# Patient Record
Sex: Female | Born: 1965 | Hispanic: No | Marital: Married | Smoking: Never smoker
Health system: Southern US, Community
[De-identification: ages and names within clinical notes are randomized; demographics above are authoritative.]

## PROBLEM LIST (undated history)

## (undated) DIAGNOSIS — R7401 Elevation of levels of liver transaminase levels: Secondary | ICD-10-CM

## (undated) DIAGNOSIS — B009 Herpesviral infection, unspecified: Secondary | ICD-10-CM

## (undated) DIAGNOSIS — G43909 Migraine, unspecified, not intractable, without status migrainosus: Secondary | ICD-10-CM

## (undated) DIAGNOSIS — R079 Chest pain, unspecified: Secondary | ICD-10-CM

## (undated) DIAGNOSIS — Z9889 Other specified postprocedural states: Secondary | ICD-10-CM

## (undated) DIAGNOSIS — Z90710 Acquired absence of both cervix and uterus: Secondary | ICD-10-CM

## (undated) DIAGNOSIS — D509 Iron deficiency anemia, unspecified: Secondary | ICD-10-CM

## (undated) DIAGNOSIS — D649 Anemia, unspecified: Secondary | ICD-10-CM

## (undated) DIAGNOSIS — K8020 Calculus of gallbladder without cholecystitis without obstruction: Secondary | ICD-10-CM

## (undated) DIAGNOSIS — E559 Vitamin D deficiency, unspecified: Secondary | ICD-10-CM

## (undated) DIAGNOSIS — D259 Leiomyoma of uterus, unspecified: Secondary | ICD-10-CM

## (undated) DIAGNOSIS — J45909 Unspecified asthma, uncomplicated: Secondary | ICD-10-CM

## (undated) DIAGNOSIS — E7800 Pure hypercholesterolemia, unspecified: Secondary | ICD-10-CM

## (undated) DIAGNOSIS — F32A Depression, unspecified: Secondary | ICD-10-CM

## (undated) DIAGNOSIS — J309 Allergic rhinitis, unspecified: Secondary | ICD-10-CM

## (undated) DIAGNOSIS — F330 Major depressive disorder, recurrent, mild: Secondary | ICD-10-CM

## (undated) DIAGNOSIS — F329 Major depressive disorder, single episode, unspecified: Secondary | ICD-10-CM

## (undated) DIAGNOSIS — Z789 Other specified health status: Secondary | ICD-10-CM

## (undated) DIAGNOSIS — A048 Other specified bacterial intestinal infections: Secondary | ICD-10-CM

## (undated) DIAGNOSIS — D219 Benign neoplasm of connective and other soft tissue, unspecified: Secondary | ICD-10-CM

## (undated) DIAGNOSIS — R002 Palpitations: Secondary | ICD-10-CM

## (undated) DIAGNOSIS — N939 Abnormal uterine and vaginal bleeding, unspecified: Secondary | ICD-10-CM

## (undated) DIAGNOSIS — IMO0001 Reserved for inherently not codable concepts without codable children: Secondary | ICD-10-CM

## (undated) HISTORY — PX: TUBAL LIGATION: SHX77

## (undated) HISTORY — DX: Pure hypercholesterolemia, unspecified: E78.00

## (undated) HISTORY — PX: UTERINE FIBROID SURGERY: SHX826

## (undated) HISTORY — DX: Abnormal uterine and vaginal bleeding, unspecified: N93.9

## (undated) HISTORY — DX: Chest pain, unspecified: R07.9

## (undated) HISTORY — DX: Reserved for inherently not codable concepts without codable children: IMO0001

## (undated) HISTORY — DX: Other specified postprocedural states: Z98.890

## (undated) HISTORY — DX: Depression, unspecified: F32.A

## (undated) HISTORY — DX: Acquired absence of both cervix and uterus: Z90.710

## (undated) HISTORY — DX: Major depressive disorder, recurrent, mild: F33.0

## (undated) HISTORY — DX: Allergic rhinitis, unspecified: J30.9

## (undated) HISTORY — DX: Leiomyoma of uterus, unspecified: D25.9

## (undated) HISTORY — DX: Palpitations: R00.2

## (undated) HISTORY — DX: Unspecified asthma, uncomplicated: J45.909

## (undated) HISTORY — DX: Major depressive disorder, single episode, unspecified: F32.9

## (undated) HISTORY — DX: Elevation of levels of liver transaminase levels: R74.01

## (undated) HISTORY — DX: Vitamin D deficiency, unspecified: E55.9

## (undated) HISTORY — DX: Other specified bacterial intestinal infections: A04.8

## (undated) HISTORY — DX: Herpesviral infection, unspecified: B00.9

## (undated) HISTORY — DX: Iron deficiency anemia, unspecified: D50.9

## (undated) HISTORY — DX: Calculus of gallbladder without cholecystitis without obstruction: K80.20

## (undated) HISTORY — DX: Benign neoplasm of connective and other soft tissue, unspecified: D21.9

## (undated) HISTORY — DX: Other specified health status: Z78.9

---

## 2010-05-07 ENCOUNTER — Emergency Department (HOSPITAL_COMMUNITY): Admission: EM | Admit: 2010-05-07 | Discharge: 2010-05-07 | Payer: Self-pay | Admitting: Emergency Medicine

## 2013-12-10 ENCOUNTER — Ambulatory Visit
Admission: RE | Admit: 2013-12-10 | Discharge: 2013-12-10 | Disposition: A | Discharge status: Home / Self Care | Payer: BC Managed Care – PPO | Source: Ambulatory Visit | Attending: Family Medicine | Admitting: Family Medicine

## 2013-12-10 ENCOUNTER — Other Ambulatory Visit: Payer: Self-pay | Admitting: Family Medicine

## 2013-12-10 DIAGNOSIS — T1490XA Injury, unspecified, initial encounter: Secondary | ICD-10-CM

## 2013-12-10 DIAGNOSIS — R52 Pain, unspecified: Secondary | ICD-10-CM

## 2014-10-08 DIAGNOSIS — Z9889 Other specified postprocedural states: Secondary | ICD-10-CM

## 2014-10-08 HISTORY — DX: Other specified postprocedural states: Z98.890

## 2015-09-25 ENCOUNTER — Encounter (HOSPITAL_COMMUNITY): Payer: Self-pay | Admitting: Emergency Medicine

## 2015-09-25 ENCOUNTER — Emergency Department (HOSPITAL_COMMUNITY)
Admission: EM | Admit: 2015-09-25 | Discharge: 2015-09-25 | Disposition: A | Discharge status: Home / Self Care | Payer: BLUE CROSS/BLUE SHIELD | Attending: Emergency Medicine | Admitting: Emergency Medicine

## 2015-09-25 DIAGNOSIS — Z7951 Long term (current) use of inhaled steroids: Secondary | ICD-10-CM | POA: Insufficient documentation

## 2015-09-25 DIAGNOSIS — Z79899 Other long term (current) drug therapy: Secondary | ICD-10-CM | POA: Diagnosis not present

## 2015-09-25 DIAGNOSIS — G43909 Migraine, unspecified, not intractable, without status migrainosus: Secondary | ICD-10-CM | POA: Diagnosis not present

## 2015-09-25 DIAGNOSIS — R51 Headache: Secondary | ICD-10-CM

## 2015-09-25 DIAGNOSIS — Z3202 Encounter for pregnancy test, result negative: Secondary | ICD-10-CM | POA: Insufficient documentation

## 2015-09-25 DIAGNOSIS — R519 Headache, unspecified: Secondary | ICD-10-CM

## 2015-09-25 HISTORY — DX: Migraine, unspecified, not intractable, without status migrainosus: G43.909

## 2015-09-25 LAB — CBC WITH DIFFERENTIAL/PLATELET
BASOS ABS: 0 10*3/uL (ref 0.0–0.1)
BASOS PCT: 0 %
EOS PCT: 2 %
Eosinophils Absolute: 0.1 10*3/uL (ref 0.0–0.7)
HEMATOCRIT: 33.7 % — AB (ref 36.0–46.0)
Hemoglobin: 10.6 g/dL — ABNORMAL LOW (ref 12.0–15.0)
LYMPHS PCT: 29 %
Lymphs Abs: 1.3 10*3/uL (ref 0.7–4.0)
MCH: 25.7 pg — ABNORMAL LOW (ref 26.0–34.0)
MCHC: 31.5 g/dL (ref 30.0–36.0)
MCV: 81.6 fL (ref 78.0–100.0)
MONO ABS: 0.3 10*3/uL (ref 0.1–1.0)
MONOS PCT: 6 %
NEUTROS ABS: 2.9 10*3/uL (ref 1.7–7.7)
Neutrophils Relative %: 63 %
PLATELETS: 326 10*3/uL (ref 150–400)
RBC: 4.13 MIL/uL (ref 3.87–5.11)
RDW: 15 % (ref 11.5–15.5)
WBC: 4.6 10*3/uL (ref 4.0–10.5)

## 2015-09-25 LAB — POC URINE PREG, ED: Preg Test, Ur: NEGATIVE

## 2015-09-25 LAB — BASIC METABOLIC PANEL
Anion gap: 9 (ref 5–15)
BUN: 19 mg/dL (ref 6–20)
CALCIUM: 9 mg/dL (ref 8.9–10.3)
CO2: 19 mmol/L — AB (ref 22–32)
CREATININE: 1.03 mg/dL — AB (ref 0.44–1.00)
Chloride: 112 mmol/L — ABNORMAL HIGH (ref 101–111)
GFR calc Af Amer: >60 mL/min (ref >60)
GLUCOSE: 99 mg/dL (ref 65–99)
Potassium: 3.9 mmol/L (ref 3.5–5.1)
Sodium: 140 mmol/L (ref 135–145)

## 2015-09-25 MED ORDER — SODIUM CHLORIDE 0.9 % IV BOLUS (SEPSIS)
1000.0000 mL | Freq: Once | INTRAVENOUS | Status: AC
  Administered 2015-09-25: 1000 mL via INTRAVENOUS

## 2015-09-25 MED ORDER — DIPHENHYDRAMINE HCL 50 MG/ML IJ SOLN
12.5000 mg | Freq: Once | INTRAMUSCULAR | Status: AC
  Administered 2015-09-25: 12.5 mg via INTRAVENOUS
  Filled 2015-09-25: qty 1

## 2015-09-25 MED ORDER — METOCLOPRAMIDE HCL 5 MG/ML IJ SOLN
10.0000 mg | Freq: Once | INTRAMUSCULAR | Status: AC
  Administered 2015-09-25: 10 mg via INTRAVENOUS
  Filled 2015-09-25: qty 2

## 2015-09-25 MED ORDER — KETOROLAC TROMETHAMINE 30 MG/ML IJ SOLN: 30.0000 mg | Freq: Once | INTRAMUSCULAR | Status: DC

## 2015-09-25 MED ORDER — SODIUM CHLORIDE 0.9 % IV BOLUS (SEPSIS): 1000.0000 mL | Freq: Once | INTRAVENOUS | Status: DC

## 2015-09-25 NOTE — ED Notes (Signed)
Pt from home c/o migraine x 2 days. She reports headache to the left side. She has been treating self with her usual migraine meds without relief. She reports nausea but no vomiting.

## 2015-09-25 NOTE — Discharge Instructions (Signed)
1. Medications: usual home medications 2. Treatment: rest, drink plenty of fluids 3. Follow Up: please followup with your primary doctor this week for discussion of your diagnoses and further evaluation after today's visit; if you do not have a primary care doctor use the resource guide provided to find one; please return to the ER for severe headache, numbness, weakness, new or worsening symptoms   Migraine Headache A migraine headache is an intense, throbbing pain on one or both sides of your head. A migraine can last for 30 minutes to several hours. CAUSES  The exact cause of a migraine headache is not always known. However, a migraine may be caused when nerves in the brain become irritated and release chemicals that cause inflammation. This causes pain. Certain things may also trigger migraines, such as:  Alcohol.  Smoking.  Stress.  Menstruation.  Aged cheeses.  Foods or drinks that contain nitrates, glutamate, aspartame, or tyramine.  Lack of sleep.  Chocolate.  Caffeine.  Hunger.  Physical exertion.  Fatigue.  Medicines used to treat chest pain (nitroglycerine), birth control pills, estrogen, and some blood pressure medicines. SIGNS AND SYMPTOMS  Pain on one or both sides of your head.  Pulsating or throbbing pain.  Severe pain that prevents daily activities.  Pain that is aggravated by any physical activity.  Nausea, vomiting, or both.  Dizziness.  Pain with exposure to bright lights, loud noises, or activity.  General sensitivity to bright lights, loud noises, or smells. Before you get a migraine, you may get warning signs that a migraine is coming (aura). An aura may include:  Seeing flashing lights.  Seeing bright spots, halos, or zigzag lines.  Having tunnel vision or blurred vision.  Having feelings of numbness or tingling.  Having trouble talking.  Having muscle weakness. DIAGNOSIS  A migraine headache is often diagnosed based  on:  Symptoms.  Physical exam.  A CT scan or MRI of your head. These imaging tests cannot diagnose migraines, but they can help rule out other causes of headaches. TREATMENT Medicines may be given for pain and nausea. Medicines can also be given to help prevent recurrent migraines.  HOME CARE INSTRUCTIONS  Only take over-the-counter or prescription medicines for pain or discomfort as directed by your health care provider. The use of long-term narcotics is not recommended.  Lie down in a dark, quiet room when you have a migraine.  Keep a journal to find out what may trigger your migraine headaches. For example, write down:  What you eat and drink.  How much sleep you get.  Any change to your diet or medicines.  Limit alcohol consumption.  Quit smoking if you smoke.  Get 7-9 hours of sleep, or as recommended by your health care provider.  Limit stress.  Keep lights dim if bright lights bother you and make your migraines worse. SEEK IMMEDIATE MEDICAL CARE IF:   Your migraine becomes severe.  You have a fever.  You have a stiff neck.  You have vision loss.  You have muscular weakness or loss of muscle control.  You start losing your balance or have trouble walking.  You feel faint or pass out.  You have severe symptoms that are different from your first symptoms. MAKE SURE YOU:   Understand these instructions.  Will watch your condition.  Will get help right away if you are not doing well or get worse.   This information is not intended to replace advice given to you by your health care  provider. Make sure you discuss any questions you have with your health care provider.   Document Released: 09/24/2005 Document Revised: 10/15/2014 Document Reviewed: 06/01/2013 Elsevier Interactive Patient Education [employer].

## 2015-09-25 NOTE — ED Provider Notes (Signed)
CSN: [id number]     Arrival date & time 09/25/15  0940 History   First MD Initiated Contact with Patient 09/25/15 [phone]     Chief Complaint  Patient presents with  . Migraine    HPI   Stephanie Hancock is a 49 y.o. female with a PMH of migraines who presents to the ED with migraine x 2 days. She states her symptoms are constant. She reports light exacerbates her pain. She states she has tried excedrin at home with no significant symptom relief. She reports associated nausea and lightheadedness. She denies fever, chills, dizziness, vision changes, neck pain, vomiting. She states her symptoms are characteristic of her typical migraine headache.   Past Medical History  Diagnosis Date  . Migraine    History reviewed. No pertinent past surgical history. No family history on file. Social History  Substance Use Topics  . Smoking status: Never Smoker   . Smokeless tobacco: None  . Alcohol Use: No   OB History    No data available      Review of Systems  Constitutional: Negative for fever and chills.  Eyes: Positive for photophobia. Negative for visual disturbance.  Gastrointestinal: Positive for nausea. Negative for vomiting.  Musculoskeletal: Negative for neck pain.  Neurological: Positive for light-headedness and headaches. Negative for dizziness, syncope, weakness and numbness.  All other systems reviewed and are negative.     Allergies  Codeine  Home Medications   Prior to Admission medications   Medication Sig Start Date End Date Taking? Authorizing Provider  albuterol (PROVENTIL HFA;VENTOLIN HFA) 108 (90 BASE) MCG/ACT inhaler Inhale 1-2 puffs into the lungs every 6 (six) hours as needed for wheezing or shortness of breath.   Yes Historical Provider, MD  aspirin-acetaminophen-caffeine (EXCEDRIN MIGRAINE) [phone] MG tablet Take 2 tablets by mouth every 6 (six) hours as needed for headache.   Yes Historical Provider, MD  fluticasone-salmeterol (ADVAIR HFA) 45-21 MCG/ACT  inhaler Inhale 2 puffs into the lungs 2 (two) times daily.   Yes Historical Provider, MD  topiramate (TOPAMAX) 50 MG tablet Take 50 mg by mouth at bedtime. 08/07/15  Yes Historical Provider, MD    BP 108/69 mmHg  Pulse 61  Temp(Src) 98.1 F (36.7 C) (Oral)  Resp 16  Wt 57.153 kg  SpO2 98%  LMP 09/25/2015 Physical Exam  Constitutional: She is oriented to person, place, and time. She appears well-developed and well-nourished. No distress.  HENT:  Head: Normocephalic and atraumatic.  Right Ear: External ear normal.  Left Ear: External ear normal.  Nose: Nose normal.  Mouth/Throat: Uvula is midline, oropharynx is clear and moist and mucous membranes are normal.  Eyes: Conjunctivae, EOM and lids are normal. Pupils are equal, round, and reactive to light. Right eye exhibits no discharge. Left eye exhibits no discharge. No scleral icterus.  Neck: Normal range of motion. Neck supple.  Cardiovascular: Normal rate, regular rhythm, normal heart sounds, intact distal pulses and normal pulses.   Pulmonary/Chest: Effort normal and breath sounds normal. No respiratory distress. She has no wheezes. She has no rales.  Abdominal: Soft. Normal appearance and bowel sounds are normal. She exhibits no distension and no mass. There is no tenderness. There is no rigidity, no rebound and no guarding.  Musculoskeletal: Normal range of motion. She exhibits no edema or tenderness.  Neurological: She is alert and oriented to person, place, and time. She has normal strength. No cranial nerve deficit or sensory deficit.  Skin: Skin is warm, dry and intact. No rash noted.  She is not diaphoretic. No erythema. No pallor.  Psychiatric: She has a normal mood and affect. Her speech is normal and behavior is normal.  Nursing note and vitals reviewed.   ED Course  Procedures (including critical care time)  Labs Review Labs Reviewed  CBC WITH DIFFERENTIAL/PLATELET - Abnormal; Notable for the following:    Hemoglobin  10.6 (*)    HCT 33.7 (*)    MCH 25.7 (*)    All other components within normal limits  BASIC METABOLIC PANEL - Abnormal; Notable for the following:    Chloride 112 (*)    CO2 19 (*)    Creatinine, Ser 1.03 (*)    All other components within normal limits  POC URINE PREG, ED    Imaging Review No results found.   I have personally reviewed and evaluated these lab results as part of my medical decision-making.   EKG Interpretation None      MDM   Final diagnoses:  Headache, unspecified headache type    49 year old female presents with migraine for the past 2 days. Reports associated photophobia and nausea. Denies fever, chills, dizziness, vision changes, neck pain, vomiting. States she has a history of migraines, and that her symptoms feel characteristic of her typical headache. Patient is afebrile. Vital signs stable. Normal neuro exam with no focal deficit. No nuchal rigidity.   Will give reglan and benadryl for headache. CBC negative for leukocytosis, hemoglobin 10.6. BMP unremarkable.  On reassessment of patient, she reports resolution of her headache.  Patient is nontoxic and well-appearing, feel she is stable for discharge at this time. Patient's presentation is consistent with her history of migraine; doubt ICH, SAH, meningitis. Patient to follow-up with PCP. Return precautions discussed. Patient verbalizes her understanding and is in agreement with plan.  BP 108/69 mmHg  Pulse 61  Temp(Src) 98.1 F (36.7 C) (Oral)  Resp 16  Wt 57.153 kg  SpO2 98%  LMP 09/25/2015     Marella Chimes, PA-C 09/25/15 [place], MD 09/26/15 [phone]

## 2016-09-05 DIAGNOSIS — N6332 Unspecified lump in axillary tail of the left breast: Secondary | ICD-10-CM | POA: Diagnosis not present

## 2016-09-05 DIAGNOSIS — D259 Leiomyoma of uterus, unspecified: Secondary | ICD-10-CM | POA: Diagnosis not present

## 2016-09-06 DIAGNOSIS — G43019 Migraine without aura, intractable, without status migrainosus: Secondary | ICD-10-CM | POA: Diagnosis not present

## 2016-09-06 DIAGNOSIS — H538 Other visual disturbances: Secondary | ICD-10-CM | POA: Diagnosis not present

## 2016-09-25 DIAGNOSIS — R922 Inconclusive mammogram: Secondary | ICD-10-CM | POA: Diagnosis not present

## 2016-09-25 DIAGNOSIS — N6332 Unspecified lump in axillary tail of the left breast: Secondary | ICD-10-CM | POA: Diagnosis not present

## 2016-11-08 DIAGNOSIS — D250 Submucous leiomyoma of uterus: Secondary | ICD-10-CM | POA: Diagnosis not present

## 2016-11-08 DIAGNOSIS — N920 Excessive and frequent menstruation with regular cycle: Secondary | ICD-10-CM | POA: Diagnosis not present

## 2016-11-08 DIAGNOSIS — E559 Vitamin D deficiency, unspecified: Secondary | ICD-10-CM | POA: Diagnosis not present

## 2016-11-08 DIAGNOSIS — D649 Anemia, unspecified: Secondary | ICD-10-CM | POA: Diagnosis not present

## 2016-11-08 DIAGNOSIS — Z3202 Encounter for pregnancy test, result negative: Secondary | ICD-10-CM | POA: Diagnosis not present

## 2016-11-12 DIAGNOSIS — N809 Endometriosis, unspecified: Secondary | ICD-10-CM | POA: Diagnosis not present

## 2016-12-14 DIAGNOSIS — R51 Headache: Secondary | ICD-10-CM | POA: Diagnosis not present

## 2016-12-14 DIAGNOSIS — G43019 Migraine without aura, intractable, without status migrainosus: Secondary | ICD-10-CM | POA: Diagnosis not present

## 2017-01-28 DIAGNOSIS — N809 Endometriosis, unspecified: Secondary | ICD-10-CM | POA: Diagnosis not present

## 2017-03-01 DIAGNOSIS — Z01419 Encounter for gynecological examination (general) (routine) without abnormal findings: Secondary | ICD-10-CM | POA: Diagnosis not present

## 2017-03-01 DIAGNOSIS — R339 Retention of urine, unspecified: Secondary | ICD-10-CM | POA: Diagnosis not present

## 2017-03-01 DIAGNOSIS — Z6827 Body mass index (BMI) 27.0-27.9, adult: Secondary | ICD-10-CM | POA: Diagnosis not present

## 2017-03-01 DIAGNOSIS — D259 Leiomyoma of uterus, unspecified: Secondary | ICD-10-CM | POA: Diagnosis not present

## 2017-03-01 DIAGNOSIS — R3911 Hesitancy of micturition: Secondary | ICD-10-CM | POA: Diagnosis not present

## 2017-03-14 DIAGNOSIS — D259 Leiomyoma of uterus, unspecified: Secondary | ICD-10-CM | POA: Diagnosis not present

## 2017-03-14 DIAGNOSIS — N946 Dysmenorrhea, unspecified: Secondary | ICD-10-CM | POA: Diagnosis not present

## 2017-03-27 ENCOUNTER — Telehealth: Payer: Self-pay | Admitting: *Deleted

## 2017-03-27 NOTE — Telephone Encounter (Signed)
Contacted the patient and gave her the appt for July 2nd at 11:30; arrive at 11:15am.

## 2017-03-29 ENCOUNTER — Encounter: Payer: Self-pay | Admitting: Obstetrics and Gynecology

## 2017-03-29 ENCOUNTER — Ambulatory Visit (INDEPENDENT_AMBULATORY_CARE_PROVIDER_SITE_OTHER): Payer: BLUE CROSS/BLUE SHIELD | Admitting: Obstetrics and Gynecology

## 2017-03-29 VITALS — BP 112/70 | HR 88 | Resp 16 | Ht 61.0 in | Wt 144.0 lb

## 2017-03-29 DIAGNOSIS — N939 Abnormal uterine and vaginal bleeding, unspecified: Secondary | ICD-10-CM | POA: Diagnosis not present

## 2017-03-29 DIAGNOSIS — D259 Leiomyoma of uterus, unspecified: Secondary | ICD-10-CM

## 2017-03-29 NOTE — Progress Notes (Signed)
GYNECOLOGY  VISIT   HPI: 51 y.o.   Married  [place] American  female   [phone] with Patient's last menstrual period was 03/29/2017.   here for fibroids; irregular cycles; was on Depo-Lupron last February 2017 - August 2017, is currently on Depo-Provera since February 2018.   Here with her husband for the discussion portion of the visit.   Interested in hysterectomy and wants to avoid an abdominal hysterectomy if possible. Declines future childbearing.   Here for a second opinion.  Has multiple fibroids.  States cervical fibroid.  Having pain.  Lots of pressure on her bladder. Some difficulty to void.  Urinary stress incontinence since having fibroids.   Feels bloated.   Bleeding off and on since February.  Light menses in March.  No cycle in April.  Had 2 cycles in May.   No prior EMB.  Hx anemia.  Last blood work was Dec or Feb. 2018, and this was normal.  Has nausea and vomiting with food intake.   Had laparoscopic myomectomy in [place] about 12 years ago.   FH of fibroids with mother and sister.   Is Jehovah's Witness.   Sees neurology for headaches.   Referred by friend, Aniceto Boss.  PCP - Dr. Jonathon Jordan.  GYNECOLOGIC HISTORY: Patient's last menstrual period was 03/29/2017. Contraception:  none Menopausal hormone therapy:  n/a Last mammogram:  About 09/2016 done at [facility] Last pap smear:   02/2016 at [facility] -- normal per patient        OB History    Gravida Para Term Preterm AB Living   3 2 0 0 1 2   SAB TAB Ectopic Multiple Live Births   1 0 0 0 0         Patient Active Problem List   Diagnosis Date Noted  . Fibroids 03/31/2017    Past Medical History:  Diagnosis Date  . Abnormal uterine bleeding   . Asthma   . Depression   . Fibroid   . Migraine    with aura  . Patient is Jehovah's Witness     Past Surgical History:  Procedure Laterality Date  . TUBAL LIGATION     then reversal  . UTERINE FIBROID SURGERY      Current  Outpatient Prescriptions  Medication Sig Dispense Refill  . albuterol (PROVENTIL HFA;VENTOLIN HFA) 108 (90 BASE) MCG/ACT inhaler Inhale 1-2 puffs into the lungs every 6 (six) hours as needed for wheezing or shortness of breath.    Marland Kitchen aspirin-acetaminophen-caffeine (EXCEDRIN MIGRAINE) 250-250-65 MG tablet Take 2 tablets by mouth every 6 (six) hours as needed for headache.    . flurbiprofen (ANSAID) 100 MG tablet Take 100 mg by mouth as needed.    . fluticasone-salmeterol (ADVAIR HFA) 45-21 MCG/ACT inhaler Inhale 2 puffs into the lungs 2 (two) times daily.    Marland Kitchen levETIRAcetam (KEPPRA XR) 500 MG 24 hr tablet   1  . medroxyPROGESTERone (DEPO-PROVERA) 150 MG/ML injection ADM 1 ML IM Q 11 TO 12 WKS  4  . zolmitriptan (ZOMIG-ZMT) 5 MG disintegrating tablet TK 1 T PO PRF MIGRAINE. MAY REPEAT IN 2 H PRN. NO MORE THAN 2 IN 24 H FOR 2 TO 3 DAYS  1   No current facility-administered medications for this visit.      ALLERGIES: Codeine and Kiwi extract  Family History  Problem Relation Age of Onset  . Hypertension Mother   . Diabetes Mother   . Osteoporosis Mother   . Hypertension Father   .  Prostate cancer Father   . Heart attack Father   . Kidney cancer Sister     Social History   Social History  . Marital status: Married    Spouse name: N/A  . Number of children: N/A  . Years of education: N/A   Occupational History  . Not on file.   Social History Main Topics  . Smoking status: Never Smoker  . Smokeless tobacco: Never Used  . Alcohol use 1.2 - 2.4 oz/week    2 - 4 Cans of beer per week  . Drug use: No  . Sexual activity: Yes    Birth control/ protection: None   Other Topics Concern  . Not on file   Social History Narrative  . No narrative on file    ROS:  Pertinent items are noted in HPI.  PHYSICAL EXAMINATION:    BP 112/70 (BP Location: Right Arm, Patient Position: Sitting, Cuff Size: Normal)   Pulse 88   Resp 16   Ht 5\' 1"  (1.549 m)   Wt 144 lb (65.3 kg)   LMP  03/29/2017   BMI 27.21 kg/m     General appearance: alert, cooperative and appears stated age Head: Normocephalic, without obvious abnormality, atraumatic Neck: no adenopathy, supple, symmetrical, trachea midline and thyroid normal to inspection and palpation Lungs: clear to auscultation bilaterally Heart: regular rate and rhythm Abdomen: Umbilical incision and lower abdominal small vertical incision, 18 week size mass, mobile, soft, non-tender. Extremities: extremities normal, atraumatic, no cyanosis or edema Skin: Skin color, texture, turgor normal. No rashes or lesions Lymph nodes: Cervical, supraclavicular, and axillary nodes normal. No abnormal inguinal nodes palpated Neurologic: Grossly normal  Pelvic: External genitalia:  no lesions              Urethra:  normal appearing urethra with no masses, tenderness or lesions              Bartholins and Skenes: normal                 Vagina: normal appearing vagina with normal color and discharge, no lesions.  Bleeding today.               Cervix: no lesions                Bimanual Exam:  Uterus:  18 week size uterus, more room on left pelvis than right, uterus does have mobility.  Has LUS suberosal versus pedunculated 2 cm right LUS fibroid.  Tender to palpation.                 Adnexa: no mass, fullness, tenderness              Rectal exam: Yes.  .  Confirms.              Anus:  normal sphincter tone, no lesions  Chaperone was present for exam.  ASSESSMENT  Fibroids.  Status post laparoscopic myomectomy with minilap for fibroid extraction.  Migraine with aura.  Asthma. Jehovah's witness.   PLAN  We discussed procedural treatment options for fibroids: Laparoscopic hysterectomy, abdominal hysterectomy, uterine artery embolization.  I think that laparoscopic hysterectomy is a possibility for her.  We reviewed rare risk of sarcoma.  We discussed morcellation in the [place] bag.  An additional 3 months of Depo Lupron would help  to improve this possibility and normalize any potential anemia.  Return for pelvic ultrasound and EMB. TSH, CBC.  Depo Provera is due 04/15/17.  Can  have this early.  Approval for Depo Lupron may delay as it needs prior authorization.  We did discuss bone effects of Depo Lupron and Depo Provera.    An After Visit Summary was printed and given to the patient.  __70____ minutes face to face time of which over 50% was spent in counseling.

## 2017-03-29 NOTE — Patient Instructions (Signed)
Total Laparoscopic Hysterectomy °A total laparoscopic hysterectomy is a minimally invasive surgery to remove your uterus and cervix. This surgery is performed by making several small cuts (incisions) in your abdomen. It can also be done with a thin, lighted tube (laparoscope) inserted into two small incisions in your lower abdomen. Your fallopian tubes and ovaries can be removed (bilateral salpingo-oophorectomy) during this surgery as well. Benefits of minimally invasive surgery include: °· Less pain. °· Less risk of blood loss. °· Less risk of infection. °· Quicker return to normal activities. ° °Tell a health care provider about: °· Any allergies you have. °· All medicines you are taking, including vitamins, herbs, eye drops, creams, and over-the-counter medicines. °· Any problems you or family members have had with anesthetic medicines. °· Any blood disorders you have. °· Any surgeries you have had. °· Any medical conditions you have. °What are the risks? °Generally, this is a safe procedure. However, as with any procedure, complications can occur. Possible complications include: °· Bleeding. °· Blood clots in the legs or lung. °· Infection. °· Injury to surrounding organs. °· Problems with anesthesia. °· Early menopause symptoms (hot flashes, night sweats, insomnia). °· Risk of conversion to an open abdominal incision. ° °What happens before the procedure? °· Ask your health care provider about changing or stopping your regular medicines. °· Do not take aspirin or blood thinners (anticoagulants) for 1 week before the surgery or as told by your health care provider. °· Do not eat or drink anything for 8 hours before the surgery or as told by your health care provider. °· Quit smoking if you smoke. °· Arrange for a ride home after surgery and for someone to help you at home during recovery. °What happens during the procedure? °· You will be given antibiotic medicine. °· An IV tube will be placed in your arm. You  will be given medicine to make you sleep (general anesthetic). °· A gas (carbon dioxide) will be used to inflate your abdomen. This will allow your surgeon to look inside your abdomen, perform your surgery, and treat any other problems found if necessary. °· Three or four small incisions (often less than 1/2 inch) will be made in your abdomen. One of these incisions will be made in the area of your belly button (navel). The laparoscope will be inserted into the incision. Your surgeon will look through the laparoscope while doing your procedure. °· Other surgical instruments will be inserted through the other incisions. °· Your uterus may be removed through your vagina or cut into small pieces and removed through the small incisions. °· Your incisions will be closed. °What happens after the procedure? °· The gas will be released from inside your abdomen. °· You will be taken to the recovery area where a nurse will watch and check your progress. Once you are awake, stable, and taking fluids well, without other problems, you will return to your room or be allowed to go home. °· There is usually minimal discomfort following the surgery because the incisions are so small. °· You will be given pain medicine while you are in the hospital and for when you go home. °This information is not intended to replace advice given to you by your health care provider. Make sure you discuss any questions you have with your health care provider. °Document Released: 07/22/2007 Document Revised: 03/01/2016 Document Reviewed: 04/14/2013 °Elsevier Interactive Patient Education © 2017 [employer] ° °

## 2017-03-31 DIAGNOSIS — D219 Benign neoplasm of connective and other soft tissue, unspecified: Secondary | ICD-10-CM | POA: Insufficient documentation

## 2017-04-04 ENCOUNTER — Ambulatory Visit (INDEPENDENT_AMBULATORY_CARE_PROVIDER_SITE_OTHER): Payer: BLUE CROSS/BLUE SHIELD | Admitting: Obstetrics and Gynecology

## 2017-04-04 ENCOUNTER — Ambulatory Visit (INDEPENDENT_AMBULATORY_CARE_PROVIDER_SITE_OTHER): Payer: BLUE CROSS/BLUE SHIELD

## 2017-04-04 ENCOUNTER — Encounter: Payer: Self-pay | Admitting: Obstetrics and Gynecology

## 2017-04-04 VITALS — BP 118/68 | HR 72 | Resp 16 | Wt 144.0 lb

## 2017-04-04 DIAGNOSIS — N939 Abnormal uterine and vaginal bleeding, unspecified: Secondary | ICD-10-CM | POA: Diagnosis not present

## 2017-04-04 DIAGNOSIS — D259 Leiomyoma of uterus, unspecified: Secondary | ICD-10-CM

## 2017-04-04 MED ORDER — MEDROXYPROGESTERONE ACETATE 150 MG/ML IM SUSP
150.0000 mg | Freq: Once | INTRAMUSCULAR | Status: AC
  Administered 2017-04-04: 150 mg via INTRAMUSCULAR

## 2017-04-04 NOTE — Patient Instructions (Signed)

## 2017-04-04 NOTE — Progress Notes (Signed)
Encounter reviewed by Dr. Brook Amundson C. Silva.  

## 2017-04-04 NOTE — Progress Notes (Signed)
GYNECOLOGY  VISIT   HPI: 51 y.o.   Married  [place] American  female   [phone] with Patient's last menstrual period was 03/29/2017.   here for ultrasound/EMB.  Has known fibroids and would like hysterectomy done even if it has to be through an open incision.  She has been treated with Depo Lupron and now Depo Provera and just wants to be done.  She declines further Depo Lupron.   Bleed every day in June and then stopped on 03/29/17 or 03/30/17.   UPT negative in office oday.   GYNECOLOGIC HISTORY: Patient's last menstrual period was 03/29/2017. Contraception:  January 28, 2017 - Depo Lupron  Menopausal hormone therapy:  n/a Last mammogram:  09/2016 BIRADS 1 negative Last pap smear:   02/2016 at [facility] -- normal per patient        OB History    Gravida Para Term Preterm AB Living   3 2 0 0 1 2   SAB TAB Ectopic Multiple Live Births   1 0 0 0 0         Patient Active Problem List   Diagnosis Date Noted  . Fibroids 03/31/2017    Past Medical History:  Diagnosis Date  . Abnormal uterine bleeding   . Asthma   . Depression   . Fibroid   . Migraine    with aura  . Patient is Jehovah's Witness     Past Surgical History:  Procedure Laterality Date  . TUBAL LIGATION     then reversal  . UTERINE FIBROID SURGERY      Current Outpatient Prescriptions  Medication Sig Dispense Refill  . albuterol (PROVENTIL HFA;VENTOLIN HFA) 108 (90 BASE) MCG/ACT inhaler Inhale 1-2 puffs into the lungs every 6 (six) hours as needed for wheezing or shortness of breath.    Marland Kitchen aspirin-acetaminophen-caffeine (EXCEDRIN MIGRAINE) 250-250-65 MG tablet Take 2 tablets by mouth every 6 (six) hours as needed for headache.    . flurbiprofen (ANSAID) 100 MG tablet Take 100 mg by mouth as needed.    . fluticasone-salmeterol (ADVAIR HFA) 45-21 MCG/ACT inhaler Inhale 2 puffs into the lungs 2 (two) times daily.    Marland Kitchen levETIRAcetam (KEPPRA XR) 500 MG 24 hr tablet   1  . medroxyPROGESTERone (DEPO-PROVERA)  150 MG/ML injection ADM 1 ML IM Q 11 TO 12 WKS  4  . zolmitriptan (ZOMIG-ZMT) 5 MG disintegrating tablet TK 1 T PO PRF MIGRAINE. MAY REPEAT IN 2 H PRN. NO MORE THAN 2 IN 24 H FOR 2 TO 3 DAYS  1   No current facility-administered medications for this visit.      ALLERGIES: Codeine and Kiwi extract  Family History  Problem Relation Age of Onset  . Hypertension Mother   . Diabetes Mother   . Osteoporosis Mother   . Hypertension Father   . Prostate cancer Father   . Heart attack Father   . Kidney cancer Sister     Social History   Social History  . Marital status: Married    Spouse name: N/A  . Number of children: N/A  . Years of education: N/A   Occupational History  . Not on file.   Social History Main Topics  . Smoking status: Never Smoker  . Smokeless tobacco: Never Used  . Alcohol use 1.2 - 2.4 oz/week    2 - 4 Cans of beer per week  . Drug use: No  . Sexual activity: Yes    Birth control/ protection: None  Other Topics Concern  . Not on file   Social History Narrative  . No narrative on file    ROS:  Pertinent items are noted in HPI.  PHYSICAL EXAMINATION:    BP 118/68 (BP Location: Right Arm, Patient Position: Sitting, Cuff Size: Normal)   Pulse 72   Resp 16   Wt 144 lb (65.3 kg)   LMP 03/29/2017   BMI 27.21 kg/m     General appearance: alert, cooperative and appears stated age   Fibroids: 2.35 cm, 2.68 cm, 2.69 cm, 10.58 cm. Endometrium distorted by fibroids.  Ovaries seen abdominally and are normal.  No free fluid.  Endometrial biopsy Consent for procedure.  Sterile prep with Hibiclens.  Paracervical block with 8 cc 1% lidocaine, lot number [id number], expiration 11/21. Tenaculum to anterior cervical lip.  Pipelle passed to 10 cm twice.  Tissue to pathology.  No complications. Minimal EBL.  Chaperone was present for exam.  ASSESSMENT  Uterine fibroids.  Irregular bleeding.  On Depo Provera. Jehovah's Witness.   PLAN  CBC, TSH,  and BMP. Follow up EMB.  Instructions and precautions given.  Depo Provera 150 mg now.  We talked about the benefits of a laparoscopic approach to surgery if possible.  Depo Lupron is optional.  Get Jehovah's Witness power of attorney papers.   An After Visit Summary was printed and given to the patient.  __25____ minutes face to face time of which over 50% was spent in counseling.

## 2017-04-05 DIAGNOSIS — G43719 Chronic migraine without aura, intractable, without status migrainosus: Secondary | ICD-10-CM | POA: Diagnosis not present

## 2017-04-05 DIAGNOSIS — R51 Headache: Secondary | ICD-10-CM | POA: Diagnosis not present

## 2017-04-05 LAB — BASIC METABOLIC PANEL
BUN / CREAT RATIO: 17 (ref 9–23)
BUN: 14 mg/dL (ref 6–24)
CHLORIDE: 104 mmol/L (ref 96–106)
CO2: 21 mmol/L (ref 20–29)
CREATININE: 0.84 mg/dL (ref 0.57–1.00)
Calcium: 10.1 mg/dL (ref 8.7–10.2)
GFR, EST AFRICAN AMERICAN: 93 mL/min/{1.73_m2} (ref >59)
GFR, EST NON AFRICAN AMERICAN: 81 mL/min/{1.73_m2} (ref >59)
Glucose: 85 mg/dL (ref 65–99)
Potassium: 3.8 mmol/L (ref 3.5–5.2)
Sodium: 140 mmol/L (ref 134–144)

## 2017-04-08 ENCOUNTER — Ambulatory Visit (HOSPITAL_BASED_OUTPATIENT_CLINIC_OR_DEPARTMENT_OTHER): Payer: BLUE CROSS/BLUE SHIELD

## 2017-04-08 ENCOUNTER — Other Ambulatory Visit: Payer: Self-pay

## 2017-04-08 ENCOUNTER — Encounter: Payer: Self-pay | Admitting: Gynecologic Oncology

## 2017-04-08 ENCOUNTER — Ambulatory Visit: Payer: BLUE CROSS/BLUE SHIELD | Attending: Gynecologic Oncology | Admitting: Gynecologic Oncology

## 2017-04-08 VITALS — BP 107/70 | HR 78 | Temp 98.3°F | Resp 20 | Ht 62.0 in | Wt 144.0 lb

## 2017-04-08 DIAGNOSIS — F329 Major depressive disorder, single episode, unspecified: Secondary | ICD-10-CM | POA: Diagnosis not present

## 2017-04-08 DIAGNOSIS — J45909 Unspecified asthma, uncomplicated: Secondary | ICD-10-CM | POA: Insufficient documentation

## 2017-04-08 DIAGNOSIS — Z808 Family history of malignant neoplasm of other organs or systems: Secondary | ICD-10-CM | POA: Diagnosis not present

## 2017-04-08 DIAGNOSIS — Z9889 Other specified postprocedural states: Secondary | ICD-10-CM | POA: Insufficient documentation

## 2017-04-08 DIAGNOSIS — Z888 Allergy status to other drugs, medicaments and biological substances status: Secondary | ICD-10-CM | POA: Insufficient documentation

## 2017-04-08 DIAGNOSIS — Z8042 Family history of malignant neoplasm of prostate: Secondary | ICD-10-CM | POA: Diagnosis not present

## 2017-04-08 DIAGNOSIS — Z833 Family history of diabetes mellitus: Secondary | ICD-10-CM | POA: Insufficient documentation

## 2017-04-08 DIAGNOSIS — Z7982 Long term (current) use of aspirin: Secondary | ICD-10-CM | POA: Insufficient documentation

## 2017-04-08 DIAGNOSIS — Z79899 Other long term (current) drug therapy: Secondary | ICD-10-CM | POA: Diagnosis not present

## 2017-04-08 DIAGNOSIS — D251 Intramural leiomyoma of uterus: Secondary | ICD-10-CM | POA: Insufficient documentation

## 2017-04-08 DIAGNOSIS — Z531 Procedure and treatment not carried out because of patient's decision for reasons of belief and group pressure: Secondary | ICD-10-CM | POA: Diagnosis not present

## 2017-04-08 DIAGNOSIS — D500 Iron deficiency anemia secondary to blood loss (chronic): Secondary | ICD-10-CM

## 2017-04-08 DIAGNOSIS — D259 Leiomyoma of uterus, unspecified: Secondary | ICD-10-CM | POA: Diagnosis not present

## 2017-04-08 DIAGNOSIS — N921 Excessive and frequent menstruation with irregular cycle: Secondary | ICD-10-CM | POA: Diagnosis not present

## 2017-04-08 DIAGNOSIS — Z8249 Family history of ischemic heart disease and other diseases of the circulatory system: Secondary | ICD-10-CM | POA: Diagnosis not present

## 2017-04-08 DIAGNOSIS — N939 Abnormal uterine and vaginal bleeding, unspecified: Secondary | ICD-10-CM

## 2017-04-08 LAB — CBC WITH DIFFERENTIAL/PLATELET
BASO%: 0.4 % (ref 0.0–2.0)
BASOS ABS: 0 10*3/uL (ref 0.0–0.1)
EOS%: 2.4 % (ref 0.0–7.0)
Eosinophils Absolute: 0.1 10*3/uL (ref 0.0–0.5)
HEMATOCRIT: 38.3 % (ref 34.8–46.6)
HGB: 12.8 g/dL (ref 11.6–15.9)
LYMPH#: 1.2 10*3/uL (ref 0.9–3.3)
LYMPH%: 28.8 % (ref 14.0–49.7)
MCH: 28.9 pg (ref 25.1–34.0)
MCHC: 33.4 g/dL (ref 31.5–36.0)
MCV: 86.6 fL (ref 79.5–101.0)
MONO#: 0.3 10*3/uL (ref 0.1–0.9)
MONO%: 7.3 % (ref 0.0–14.0)
NEUT#: 2.4 10*3/uL (ref 1.5–6.5)
NEUT%: 61.1 % (ref 38.4–76.8)
Platelets: 228 10*3/uL (ref 145–400)
RBC: 4.42 10*6/uL (ref 3.70–5.45)
RDW: 13.9 % (ref 11.2–14.5)
WBC: 4 10*3/uL (ref 3.9–10.3)

## 2017-04-08 MED ORDER — SENNA 8.6 MG PO TABS: 1.0000 | ORAL_TABLET | Freq: Every day | ORAL | 0 refills | Status: DC

## 2017-04-08 MED ORDER — LEUPROLIDE ACETATE (3 MONTH) 11.25 MG IM KIT
11.2500 mg | PACK | Freq: Once | INTRAMUSCULAR | Status: DC
  Filled 2017-04-08: qty 11.25

## 2017-04-08 MED ORDER — FERROUS GLUCONATE 225 (27 FE) MG PO TABS: 240.0000 mg | ORAL_TABLET | Freq: Three times a day (TID) | ORAL | 3 refills | Status: DC

## 2017-04-08 MED ORDER — LEUPROLIDE ACETATE (3 MONTH) 11.25 MG IM KIT
11.2500 mg | PACK | Freq: Once | INTRAMUSCULAR | Status: AC
  Administered 2017-04-08: 11.25 mg via INTRAMUSCULAR
  Filled 2017-04-08: qty 11.25

## 2017-04-08 NOTE — Progress Notes (Signed)
Consult Note: Gyn-Onc  Consult was requested by Dr. Pamala Hurry and Dr Sherryl Barters the evaluation of Stephanie Hancock 51 y.o. female  CC:  Chief Complaint  Patient presents with  . Fibroids    Assessment/Plan:  [name]  is a 48 y.o.  year old with symptomatic uterine fibroids with anemia and menometorrhagia and pain. She is a Restaurant manager, fast food. Endometrial biopsy pending.  I discussed that, provided her biopsy is benign, surgery is elective and should proceed based on the patient's tolerance of symptoms. I discussed that at menopause her symptoms will resolve and I would expect involution of her fibroids. If, however, her symptoms are too severe to wait until such a time, she could consider hysterectomy. I explained that hysterectomy carried with it particularly high risks for her because of her history of prior myomectomy (increased risk for adhesive disease and damage to GI or GU structures including need for reoperation), and her Jehovah's Witness status with anemia and bulky fibroid uterus which places her at risk for intraoperative or postoperative hemorrhage which cannot be controlled by transfusion (given her religious beliefs). I discussed that iron and Epo do not reverse acute blood loss anemia quickly enough to be life saving in critical situations. I discussed that it is imperative that we attempt to shrink her fibroids preoperatively with an additional Lupron dose (given today) to minimize blood loss during surgery and induce amenorrhea preop (to improve preop Hb). I have prescribed iron replacement and sennakot for optimization of Hb. We will check CBC today and in 2 months after Lupron given today.  The patient is electing for surgery with hysterectomy. I will see her in August closer to her surgical date. She is scheduled for a robotic hysterectomy for uterus >250gm, bilateral salpingectomy and minilaparotomy for specimen delivery.   HPI: Stephanie Hancock is a 21 year old  woman (P2) who is seen as a new patient for symptomatic uterine fibroids, menometorrhagia and pain. She is a Restaurant manager, fast food.  The patient has a long standing history of fibroids. She underwent laparoscopic myomectomy with minilaparotomy for specimen delivery in 2003. She did well postoperatively until 2017 when she began developing "severe pelvic pains" which are present all the time and worse with bowel movements and urination and lying flat at night. She also reports her menses becoming heavy at that time. Ultrasound confirmed multiple intramural fibroids. She was treated with 6 months of Depot Lupron until August, 2017. During that time the bleeding improved and the fibroids decreased in volume. After she came off the lupron her pain and menses became worse than ever. She was started on Depot Provera which somewhat improved the bleeding symptoms but not the pain.  She was seen by Dr Quincy Simmonds on 03/29/17 and an endometrial biopsy was taken (not yet resulted). A TVUS showed a uterus measuring 12x9x9.2cm with multiple intramural fibroids.  The symptoms are quality of life limiting. She is a severe asthmatic but controlled on inhaled agents and not on oral steroids. She has had a laparoscopic myomectomy but no other abdominal surgeries. She has had 2 vaginal deliveries. She is treated for migraine headaches.  Current Meds:  Outpatient Encounter Prescriptions as of 04/08/2017  Medication Sig  . albuterol (PROVENTIL HFA;VENTOLIN HFA) 108 (90 BASE) MCG/ACT inhaler Inhale 1-2 puffs into the lungs every 6 (six) hours as needed for wheezing or shortness of breath.  Marland Kitchen aspirin-acetaminophen-caffeine (EXCEDRIN MIGRAINE) 250-250-65 MG tablet Take 2 tablets by mouth every 6 (six) hours as needed for headache.  Marland Kitchen  flurbiprofen (ANSAID) 100 MG tablet Take 100 mg by mouth as needed.  . fluticasone-salmeterol (ADVAIR HFA) 45-21 MCG/ACT inhaler Inhale 2 puffs into the lungs 2 (two) times daily.  Marland Kitchen levETIRAcetam (KEPPRA  XR) 500 MG 24 hr tablet   . zolmitriptan (ZOMIG-ZMT) 5 MG disintegrating tablet TK 1 T PO PRF MIGRAINE. MAY REPEAT IN 2 H PRN. NO MORE THAN 2 IN 24 H FOR 2 TO 3 DAYS  . medroxyPROGESTERone (DEPO-PROVERA) 150 MG/ML injection ADM 1 ML IM Q 11 TO 12 WKS   Facility-Administered Encounter Medications as of 04/08/2017  Medication  . leuprolide (LUPRON) injection 11.25 mg    Allergy:  Allergies  Allergen Reactions  . Codeine Hives  . Kiwi Extract     Social Hx:   Social History   Social History  . Marital status: Married    Spouse name: N/A  . Number of children: N/A  . Years of education: N/A   Occupational History  . Not on file.   Social History Main Topics  . Smoking status: Never Smoker  . Smokeless tobacco: Never Used  . Alcohol use 1.2 - 2.4 oz/week    2 - 4 Cans of beer per week  . Drug use: No  . Sexual activity: Yes    Birth control/ protection: None   Other Topics Concern  . Not on file   Social History Narrative  . No narrative on file    Past Surgical Hx:  Past Surgical History:  Procedure Laterality Date  . TUBAL LIGATION     then reversal  . UTERINE FIBROID SURGERY      Past Medical Hx:  Past Medical History:  Diagnosis Date  . Abnormal uterine bleeding   . Asthma   . Depression   . Fibroid   . Migraine    with aura  . Patient is Jehovah's Witness     Past Gynecological History:  Menometorrhagia. + SVD x 2 Patient's last menstrual period was 03/29/2017.  Family Hx:  Family History  Problem Relation Age of Onset  . Hypertension Mother   . Diabetes Mother   . Osteoporosis Mother   . Hypertension Father   . Prostate cancer Father   . Heart attack Father   . Kidney cancer Sister     Review of Systems:  Constitutional  Feels  fatigued  ENT Normal appearing ears and nares bilaterally Skin/Breast  No rash, sores, jaundice, itching, dryness Cardiovascular  No chest pain, shortness of breath, or edema  Pulmonary  No cough or  wheeze.  Gastro Intestinal  No nausea, vomitting, or diarrhoea. No bright red blood per rectum, no abdominal pain, change in bowel movement, or constipation.  Genito Urinary  No frequency, urgency, dysuria, + heavy vaginal bleeding Musculo Skeletal  No myalgia, arthralgia, joint swelling or pain  Neurologic  + headaches Psychology  No depression, anxiety, insomnia.   Vitals:  Blood pressure 107/70, pulse 78, temperature 98.3 F (36.8 C), resp. rate 20, height 5\' 2"  (1.575 m), weight 144 lb (65.3 kg), last menstrual period 03/29/2017, SpO2 100 %.  Physical Exam: WD in NAD Neck  Supple NROM, without any enlargements.  Lymph Node Survey No cervical supraclavicular or inguinal adenopathy Cardiovascular  Pulse normal rate, regularity and rhythm. S1 and S2 normal.  Lungs  Clear to auscultation bilateraly, without wheezes/crackles/rhonchi. Good air movement.  Skin  No rash/lesions/breakdown  Psychiatry  Alert and oriented to person, place, and time  Abdomen  Normoactive bowel sounds, abdomen soft, non-tender and  slightly overweight/obese without evidence of hernia. There is a palpable mass in the inferior aspect of her abdomen (below umbilicus, consistent with a 12 week size uterus). Back No CVA tenderness Genito Urinary  Vulva/vagina: Normal external female genitalia.  No lesions. No discharge or bleeding.  Bladder/urethra:  No lesions or masses, well supported bladder  Vagina: normal  Cervix: Normal appearing, no lesions.  Uterus: 12 week size, mobile, anterior left fibroid appreciated.   Adnexa: no discrete masses other than fibroids. Rectal  deferred  Extremities  No bilateral cyanosis, clubbing or edema.  60 minutes was spent with the patient including >50% of face to face counseling time.  Donaciano Eva, MD  04/08/2017, 1:36 PM

## 2017-04-08 NOTE — Patient Instructions (Signed)
Leuprolide depot injection What is this medicine? LEUPROLIDE (loo PROE lide) is a man-made protein that acts like a natural hormone in the body. It decreases testosterone in men and decreases estrogen in women. In men, this medicine is used to treat advanced prostate cancer. In women, some forms of this medicine may be used to treat endometriosis, uterine fibroids, or other female hormone-related problems. This medicine may be used for other purposes; ask your health care provider or pharmacist if you have questions. COMMON BRAND NAME(S): Eligard, Lupron Depot, Lupron Depot-Ped, Viadur What should I tell my health care provider before I take this medicine? They need to know if you have any of these conditions: -diabetes -heart disease or previous heart attack -high blood pressure -high cholesterol -mental illness -osteoporosis -pain or difficulty passing urine -seizures -spinal cord metastasis -stroke -suicidal thoughts, plans, or attempt; a previous suicide attempt by you or a family member -tobacco smoker -unusual vaginal bleeding (women) -an unusual or allergic reaction to leuprolide, benzyl alcohol, other medicines, foods, dyes, or preservatives -pregnant or trying to get pregnant -breast-feeding How should I use this medicine? This medicine is for injection into a muscle or for injection under the skin. It is given by a health care professional in a hospital or clinic setting. The specific product will determine how it will be given to you. Make sure you understand which product you receive and how often you will receive it. Talk to your pediatrician regarding the use of this medicine in children. Special care may be needed. Overdosage: If you think you have taken too much of this medicine contact a poison control center or emergency room at once. NOTE: This medicine is only for you. Do not share this medicine with others. What if I miss a dose? It is important not to miss a dose.  Call your doctor or health care professional if you are unable to keep an appointment. Depot injections: Depot injections are given either once-monthly, every 12 weeks, every 16 weeks, or every 24 weeks depending on the product you are prescribed. The product you are prescribed will be based on if you are female or female, and your condition. Make sure you understand your product and dosing. What may interact with this medicine? Do not take this medicine with any of the following medications: -chasteberry This medicine may also interact with the following medications: -herbal or dietary supplements, like black cohosh or DHEA -female hormones, like estrogens or progestins and birth control pills, patches, rings, or injections -female hormones, like testosterone This list may not describe all possible interactions. Give your health care provider a list of all the medicines, herbs, non-prescription drugs, or dietary supplements you use. Also tell them if you smoke, drink alcohol, or use illegal drugs. Some items may interact with your medicine. What should I watch for while using this medicine? Visit your doctor or health care professional for regular checks on your progress. During the first weeks of treatment, your symptoms may get worse, but then will improve as you continue your treatment. You may get hot flashes, increased bone pain, increased difficulty passing urine, or an aggravation of nerve symptoms. Discuss these effects with your doctor or health care professional, some of them may improve with continued use of this medicine. Female patients may experience a menstrual cycle or spotting during the first months of therapy with this medicine. If this continues, contact your doctor or health care professional. What side effects may I notice from receiving this medicine? Side   effects that you should report to your doctor or health care professional as soon as possible: -allergic reactions like skin  rash, itching or hives, swelling of the face, lips, or tongue -breathing problems -chest pain -depression or memory disorders -pain in your legs or groin -pain at site where injected or implanted -seizures -severe headache -swelling of the feet and legs -suicidal thoughts or other mood changes -visual changes -vomiting Side effects that usually do not require medical attention (report to your doctor or health care professional if they continue or are bothersome): -breast swelling or tenderness -decrease in sex drive or performance -diarrhea -hot flashes -loss of appetite -muscle, joint, or bone pains -nausea -redness or irritation at site where injected or implanted -skin problems or acne This list may not describe all possible side effects. Call your doctor for medical advice about side effects. You may report side effects to FDA at 1-800-FDA-1088. Where should I keep my medicine? This drug is given in a hospital or clinic and will not be stored at home. NOTE: This sheet is a summary. It may not cover all possible information. If you have questions about this medicine, talk to your doctor, pharmacist, or health care provider.  2018 [employer]/Gold Standard (2016-03-08 09:45:53)  

## 2017-04-08 NOTE — Patient Instructions (Addendum)
Plan to have a lupron injection today along with a CBC lab draw.  We will plan to see you back for an appointment before surgery.                 Preparing for your Surgery  Plan for surgery on June 11, 2017 with Dr. Everitt Amber at [facility] will be scheduled for a robotic assisted total hysterectomy, bilateral salpingectomy, mini-laparotomy.  Pre-operative Testing -You will receive a phone call from presurgical testing at [facility] to arrange for a pre-operative testing appointment before your surgery.  This appointment normally occurs one to two weeks before your scheduled surgery.   -Bring your insurance card, copy of an advanced directive if applicable, medication list  -At that visit, you will be asked to sign a consent for a possible blood transfusion in case a transfusion becomes necessary during surgery.  The need for a blood transfusion is rare but having consent is a necessary part of your care.     -You should not be taking blood thinners or aspirin at least ten days prior to surgery unless instructed by your surgeon.  Day Before Surgery at [facility] will be asked to take in a light diet the day before surgery.  Avoid carbonated beverages.  You will be advised to have nothing to eat or drink after midnight the evening before.     Eat a light diet the day before surgery.  Examples including soups, broths, toast, yogurt, mashed potatoes.  Things to avoid include carbonated beverages (fizzy beverages), raw fruits and raw vegetables, or beans.    If your bowels are filled with gas, your surgeon will have difficulty visualizing your pelvic organs which increases your surgical risks.  Your role in recovery Your role is to become active as soon as directed by your doctor, while still giving yourself time to heal.  Rest when you feel tired. You will be asked to do the following in order to speed your recovery:  - Cough and breathe deeply. This helps toclear  and expand your lungs and can prevent pneumonia. You may be given a spirometer to practice deep breathing. A staff member will show you how to use the spirometer. - Do mild physical activity. Walking or moving your legs help your circulation and body functions return to normal. A staff member will help you when you try to walk and will provide you with simple exercises. Do not try to get up or walk alone the first time. - Actively manage your pain. Managing your pain lets you move in comfort. We will ask you to rate your pain on a scale of zero to 10. It is your responsibility to tell your doctor or nurse where and how much you hurt so your pain can be treated.  Special Considerations -If you are diabetic, you may be placed on insulin after surgery to have closer control over your blood sugars to promote healing and recovery.  This does not mean that you will be discharged on insulin.  If applicable, your oral antidiabetics will be resumed when you are tolerating a solid diet.  -Your final pathology results from surgery should be available by the Friday after surgery and the results will be relayed to you when available.   Blood Transfusion Information WHAT IS A BLOOD TRANSFUSION? A transfusion is the replacement of blood or some of its parts. Blood is made up of multiple cells which provide different functions.  Red  blood cells carry oxygen and are used for blood loss replacement.  White blood cells fight against infection.  Platelets control bleeding.  Plasma helps clot blood.  Other blood products are available for specialized needs, such as hemophilia or other clotting disorders. BEFORE THE TRANSFUSION  Who gives blood for transfusions?   You may be able to donate blood to be used at a later date on yourself (autologous donation).  Relatives can be asked to donate blood. This is generally not any safer than if you have received blood from a stranger. The same precautions are taken to  ensure safety when a relative's blood is donated.  Healthy volunteers who are fully evaluated to make sure their blood is safe. This is blood bank blood. Transfusion therapy is the safest it has ever been in the practice of medicine. Before blood is taken from a donor, a complete history is taken to make sure that person has no history of diseases nor engages in risky social behavior (examples are intravenous drug use or sexual activity with multiple partners). The donor's travel history is screened to minimize risk of transmitting infections, such as malaria. The donated blood is tested for signs of infectious diseases, such as HIV and hepatitis. The blood is then tested to be sure it is compatible with you in order to minimize the chance of a transfusion reaction. If you or a relative donates blood, this is often done in anticipation of surgery and is not appropriate for emergency situations. It takes many days to process the donated blood. RISKS AND COMPLICATIONS Although transfusion therapy is very safe and saves many lives, the main dangers of transfusion include:   Getting an infectious disease.  Developing a transfusion reaction. This is an allergic reaction to something in the blood you were given. Every precaution is taken to prevent this. The decision to have a blood transfusion has been considered carefully by your caregiver before blood is given. Blood is not given unless the benefits outweigh the risks.  Blood Transfusion Information WHAT IS A BLOOD TRANSFUSION? A transfusion is the replacement of blood or some of its parts. Blood is made up of multiple cells which provide different functions.  Red blood cells carry oxygen and are used for blood loss replacement.  White blood cells fight against infection.  Platelets control bleeding.  Plasma helps clot blood.  Other blood products are available for specialized needs, such as hemophilia or other clotting disorders. BEFORE THE  TRANSFUSION  Who gives blood for transfusions?   You may be able to donate blood to be used at a later date on yourself (autologous donation).  Relatives can be asked to donate blood. This is generally not any safer than if you have received blood from a stranger. The same precautions are taken to ensure safety when a relative's blood is donated.  Healthy volunteers who are fully evaluated to make sure their blood is safe. This is blood bank blood. Transfusion therapy is the safest it has ever been in the practice of medicine. Before blood is taken from a donor, a complete history is taken to make sure that person has no history of diseases nor engages in risky social behavior (examples are intravenous drug use or sexual activity with multiple partners). The donor's travel history is screened to minimize risk of transmitting infections, such as malaria. The donated blood is tested for signs of infectious diseases, such as HIV and hepatitis. The blood is then tested to be sure it is  compatible with you in order to minimize the chance of a transfusion reaction. If you or a relative donates blood, this is often done in anticipation of surgery and is not appropriate for emergency situations. It takes many days to process the donated blood. RISKS AND COMPLICATIONS Although transfusion therapy is very safe and saves many lives, the main dangers of transfusion include:   Getting an infectious disease.  Developing a transfusion reaction. This is an allergic reaction to something in the blood you were given. Every precaution is taken to prevent this. The decision to have a blood transfusion has been considered carefully by your caregiver before blood is given. Blood is not given unless the benefits outweigh the risks.  Leuprolide injection What is this medicine? LEUPROLIDE (loo PROE lide) is a man-made hormone. It is used to treat the symptoms of prostate cancer. This medicine may also be used to treat  children with early onset of puberty. It may be used for other hormonal conditions. This medicine may be used for other purposes; ask your health care provider or pharmacist if you have questions. COMMON BRAND NAME(S): Lupron What should I tell my health care provider before I take this medicine? They need to know if you have any of these conditions: -diabetes -heart disease or previous heart attack -high blood pressure -high cholesterol -pain or difficulty passing urine -spinal cord metastasis -stroke -tobacco smoker -an unusual or allergic reaction to leuprolide, benzyl alcohol, other medicines, foods, dyes, or preservatives -pregnant or trying to get pregnant -breast-feeding How should I use this medicine? This medicine is for injection under the skin or into a muscle. You will be taught how to prepare and give this medicine. Use exactly as directed. Take your medicine at regular intervals. Do not take your medicine more often than directed. It is important that you put your used needles and syringes in a special sharps container. Do not put them in a trash can. If you do not have a sharps container, call your pharmacist or healthcare provider to get one. A special MedGuide will be given to you by the pharmacist with each prescription and refill. Be sure to read this information carefully each time. Talk to your pediatrician regarding the use of this medicine in children. While this medicine may be prescribed for children as young as 8 years for selected conditions, precautions do apply. Overdosage: If you think you have taken too much of this medicine contact a poison control center or emergency room at once. NOTE: This medicine is only for you. Do not share this medicine with others. What if I miss a dose? If you miss a dose, take it as soon as you can. If it is almost time for your next dose, take only that dose. Do not take double or extra doses. What may interact with this  medicine? Do not take this medicine with any of the following medications: -chasteberry This medicine may also interact with the following medications: -herbal or dietary supplements, like black cohosh or DHEA -female hormones, like estrogens or progestins and birth control pills, patches, rings, or injections -female hormones, like testosterone This list may not describe all possible interactions. Give your health care provider a list of all the medicines, herbs, non-prescription drugs, or dietary supplements you use. Also tell them if you smoke, drink alcohol, or use illegal drugs. Some items may interact with your medicine. What should I watch for while using this medicine? Visit your doctor or health care professional for regular  checks on your progress. During the first week, your symptoms may get worse, but then will improve as you continue your treatment. You may get hot flashes, increased bone pain, increased difficulty passing urine, or an aggravation of nerve symptoms. Discuss these effects with your doctor or health care professional, some of them may improve with continued use of this medicine. Female patients may experience a menstrual cycle or spotting during the first 2 months of therapy with this medicine. If this continues, contact your doctor or health care professional. What side effects may I notice from receiving this medicine? Side effects that you should report to your doctor or health care professional as soon as possible: -allergic reactions like skin rash, itching or hives, swelling of the face, lips, or tongue -breathing problems -chest pain -depression or memory disorders -pain in your legs or groin -pain at site where injected -severe headache -swelling of the feet and legs -visual changes -vomiting Side effects that usually do not require medical attention (report to your doctor or health care professional if they continue or are bothersome): -breast swelling or  tenderness -decrease in sex drive or performance -diarrhea -hot flashes -loss of appetite -muscle, joint, or bone pains -nausea -redness or irritation at site where injected -skin problems or acne This list may not describe all possible side effects. Call your doctor for medical advice about side effects. You may report side effects to FDA at 1-800-FDA-1088. Where should I keep my medicine? Keep out of the reach of children. Store below 25 degrees C (77 degrees F). Do not freeze. Protect from light. Do not use if it is not clear or if there are particles present. Throw away any unused medicine after the expiration date. NOTE: This sheet is a summary. It may not cover all possible information. If you have questions about this medicine, talk to your doctor, pharmacist, or health care provider.  2018 Elsevier/Gold Standard (2016-05-14 10:54:35)

## 2017-04-09 ENCOUNTER — Other Ambulatory Visit: Payer: Self-pay | Admitting: Neurology

## 2017-04-10 LAB — SPECIMEN STATUS REPORT

## 2017-04-10 LAB — TSH: TSH: 2.46 u[IU]/mL (ref 0.450–4.500)

## 2017-04-11 ENCOUNTER — Telehealth: Payer: Self-pay | Admitting: Obstetrics and Gynecology

## 2017-04-11 ENCOUNTER — Other Ambulatory Visit: Payer: Self-pay | Admitting: Neurology

## 2017-04-11 DIAGNOSIS — R519 Headache, unspecified: Secondary | ICD-10-CM

## 2017-04-11 DIAGNOSIS — R51 Headache: Principal | ICD-10-CM

## 2017-04-11 NOTE — Telephone Encounter (Signed)
-----   Message from Nunzio Cobbs, MD sent at 04/10/2017 12:36 PM EDT ----- Please report to the patient that her TSH and blood chemistries are normal.  I see that she is consulting with Dr. Denman George simultaneously while she is consulting here for her fibroids and hysterectomy planning.  She needs to choose her provider so that she is receiving clear care.  I am happy for her to see Dr. Denman George.

## 2017-04-11 NOTE — Telephone Encounter (Signed)
Please report final EMB report which shows benign endometrium with hormone effect.  There is no sign of precancer or cancer.   She is seeing Dr. Denman George and it looks like she is planning surgical care with her.  This is patient choice.   Her BMP and TSH were normal. Dr. Denman George did a CBC for her at her visit on 04/08/17.  We just need to have a clear understanding of our role in the office.

## 2017-04-11 NOTE — Telephone Encounter (Signed)
Call to patient to review lab results. Advised of TSH, BMP and endometrial biopsy result as directed by Dr Quincy Simmonds. Advised in review of labs, Dr Quincy Simmonds noted labs from Dr Denman George and her plan for surgery with Dr Denman George. Patient states she saw her for second opinion as recommended by her referring provider.  She is checking with insurance company and she and husband will make final decision and let us know. Patient asking how soon we could perform surgery. Discussed that we have dates in August but with Lupron Depot (11..25 mg) give at Dr Denman George office, she needs to wait 11-12 weeks to get full benefit of medication and maximize affect on uterus. Patient states she will call back with outcome of her decision.  Routing to provider for final review. Patient agreeable to disposition. Will close encounter.

## 2017-04-15 ENCOUNTER — Telehealth: Payer: Self-pay

## 2017-04-15 NOTE — Telephone Encounter (Signed)
Told Stephanie Hancock that her hemoglobin was good at 12.8 at visit on 04-08-17 per Joylene John, NP.

## 2017-04-24 ENCOUNTER — Other Ambulatory Visit: Payer: BLUE CROSS/BLUE SHIELD

## 2017-04-25 ENCOUNTER — Telehealth: Payer: Self-pay | Admitting: Gynecologic Oncology

## 2017-04-25 DIAGNOSIS — N939 Abnormal uterine and vaginal bleeding, unspecified: Secondary | ICD-10-CM

## 2017-04-25 NOTE — Telephone Encounter (Signed)
Patient called the office reporting persistent heavy vaginal bleeding and pain.  She reports no improvement after receiving lupron injection at office visit.  She states she has been wearing 6 maxi pads at a time and is supposed to go visit her mother next week but does not want to travel with the bleeding.  She states it makes her feel nasty.  Furthermore, she reports increasing her Keppra dose per Dr. Catalina Gravel, neuro, for migraines.  She states she increased the dose on July 6 and ever since then "everything has been happening."  She has been hallucinating, seeing bugs.  She has thoughts about hurting herself, reporting thoughts saying "wonder what would happen if you did this, etc."  She states she has had 4 migraines since Monday.  She lowered the dose of her keppra herself this week and is now taking 4 tablets at night.  She has not reached out to her neurologist.  She is able to recognize the thoughts and states "this is not me.  I have always been told I am so bubbly."  No thoughts of harming herself at this time.  Advised to seek care at the Emergency Room if the thoughts return.  Advised her that I would speak with Dr. Denman George about the bleeding and contact Dr. Ovid Curd office and give her a call back.  Called Dr. Ovid Curd office and asked to speak with a nurse.  The front desk staff stated there was not a nurse there for me to speak with.  I informed her that I needed to speak with Dr. Catalina Gravel or his assistant as soon as possible about the patient's above symptoms.  She took down the message and my contact info and was taking the message to Dr. Catalina Gravel.  Per Dr. Denman George: We can check a hemoglobin and if it is greater than 8, there is nothing we would do at this time until she has her psych issues addressed.  Returned call to patient.  Informed her of Dr. Serita Grit recommendations and that she should be hearing from Dr. Ovid Curd office but to give his office a call at 4pm if not.  Also reinforced to go to the ER if  suicidal thoughts return.  She states she has not had "any suggestions" for the past two days so she feels decreasing the medication has helped.  She is going to come in tomorrow at 2:30 for a CBC to evaluate her hemoglobin.  Advised her to please call for any needs and to seek emergency care for suicidal ideations, hallucinations.

## 2017-04-26 ENCOUNTER — Other Ambulatory Visit (HOSPITAL_BASED_OUTPATIENT_CLINIC_OR_DEPARTMENT_OTHER): Payer: BLUE CROSS/BLUE SHIELD

## 2017-04-26 DIAGNOSIS — N939 Abnormal uterine and vaginal bleeding, unspecified: Secondary | ICD-10-CM

## 2017-04-26 DIAGNOSIS — D259 Leiomyoma of uterus, unspecified: Secondary | ICD-10-CM

## 2017-04-26 LAB — CBC
HCT: 39.3 % (ref 34.8–46.6)
HEMOGLOBIN: 13.1 g/dL (ref 11.6–15.9)
MCH: 29.4 pg (ref 25.1–34.0)
MCHC: 33.3 g/dL (ref 31.5–36.0)
MCV: 88.1 fL (ref 79.5–101.0)
PLATELETS: 215 10*3/uL (ref 145–400)
RBC: 4.46 10*6/uL (ref 3.70–5.45)
RDW: 13.6 % (ref 11.2–14.5)
WBC: 5.5 10*3/uL (ref 3.9–10.3)

## 2017-04-29 ENCOUNTER — Telehealth: Payer: Self-pay

## 2017-04-29 NOTE — Telephone Encounter (Signed)
Told Ms Safley that her hemoglobin was good at 13.1 per Melissa Cross,NP on 04-26-17. Pt verbalized understanding.

## 2017-05-29 NOTE — Progress Notes (Signed)
Do ypu want type ans screen and blood consent, it is not ordered for 06-11-17 surgery? If so, please put orders in epic. Thanks

## 2017-05-29 NOTE — Patient Instructions (Addendum)
Stephanie Hancock  05/29/2017   Your procedure is scheduled on: Tuesday 06-11-17  Report to [facility] Main  Entrance Take [place]  elevators to 3rd floor to  [place] at 530 AM.   Call this number if you have problems the morning of surgery [phone]   Remember: ONLY 1 PERSON MAY GO WITH YOU TO SHORT STAY TO GET  READY MORNING OF Stephanie Hancock.  Do not eat food or drink liquids :After Midnight.   LIGHT DIET DAY BEFORE SURGERY ON 06-10-17 SEE INSTRUCTIONS BELOW   Take these medicines the morning of surgery with A SIP OF WATER: ALBUTEROL INHALER IF NEEDED AND BRING INHALER WITH YOU, ADVAIR, ALBUTEROL NEUULIZER IF NEEDED             You may not have any metal on your body including hair pins and              piercings  Do not wear jewelry, make-up, lotions, powders or perfumes, deodorant             Do not wear nail polish.  Do not shave  48 hours prior to surgery.              Men may shave face and neck.   Do not bring valuables to the hospital. [name].  Contacts, dentures or bridgework may not be worn into surgery.  Leave suitcase in the car. After surgery it may be brought to your room.                  Please read over the following fact sheets you were given: _____________________________________________________________________             Eat a light diet the day before surgery.  Examples including soups, broths, toast, yogurt, mashed potatoes.  Things to avoid include carbonated beverages (fizzy beverages), raw fruits and raw vegetables, or beans.   If your bowels are filled with gas, your surgeon will have difficulty visualizing your pelvic organs which increases your surgical risks.[name] - Preparing for Surgery Before surgery, you can play an important role.  Because skin is not sterile, your skin needs to be as free of germs as possible.  You can reduce the number of germs on your skin by  washing with CHG (chlorahexidine gluconate) soap before surgery.  CHG is an antiseptic cleaner which kills germs and bonds with the skin to continue killing germs even after washing. Please DO NOT use if you have an allergy to CHG or antibacterial soaps.  If your skin becomes reddened/irritated stop using the CHG and inform your nurse when you arrive at Short Stay. Do not shave (including legs and underarms) for at least 48 hours prior to the first CHG shower.  You may shave your face/neck. Please follow these instructions carefully:  1.  Shower with CHG Soap the night before surgery and the  morning of Surgery.  2.  If you choose to wash your hair, wash your hair first as usual with your  normal  shampoo.  3.  After you shampoo, rinse your hair and body thoroughly to remove the  shampoo.  4.  Use CHG as you would any other liquid soap.  You can apply chg directly  to the skin and wash                       Gently with a scrungie or clean washcloth.  5.  Apply the CHG Soap to your body ONLY FROM THE NECK DOWN.   Do not use on face/ open                           Wound or open sores. Avoid contact with eyes, ears mouth and genitals (private parts).                       Wash face,  Genitals (private parts) with your normal soap.             6.  Wash thoroughly, paying special attention to the area where your surgery  will be performed.  7.  Thoroughly rinse your body with warm water from the neck down.  8.  DO NOT shower/wash with your normal soap after using and rinsing off  the CHG Soap.                9.  Pat yourself dry with a clean towel.            10.  Wear clean pajamas.            11.  Place clean sheets on your bed the night of your first shower and do not  sleep with pets. Day of Surgery : Do not apply any lotions/deodorants the morning of surgery.  Please wear clean clothes to the hospital/surgery center.  FAILURE TO FOLLOW THESE INSTRUCTIONS MAY RESULT IN THE  CANCELLATION OF YOUR SURGERY PATIENT SIGNATURE_________________________________  NURSE SIGNATURE__________________________________  ________________________________________________________________________   [name]  An incentive spirometer is a tool that can help keep your lungs clear and active. This tool measures how well you are filling your lungs with each breath. Taking long deep breaths may help reverse or decrease the chance of developing breathing (pulmonary) problems (especially infection) following:  A long period of time when you are unable to move or be active. BEFORE THE PROCEDURE   If the spirometer includes an indicator to show your best effort, your nurse or respiratory therapist will set it to a desired goal.  If possible, sit up straight or lean slightly forward. Try not to slouch.  Hold the incentive spirometer in an upright position. INSTRUCTIONS FOR USE  1. Sit on the edge of your bed if possible, or sit up as far as you can in bed or on a chair. 2. Hold the incentive spirometer in an upright position. 3. Breathe out normally. 4. Place the mouthpiece in your mouth and seal your lips tightly around it. 5. Breathe in slowly and as deeply as possible, raising the piston or the ball toward the top of the column. 6. Hold your breath for 3-5 seconds or for as long as possible. Allow the piston or ball to fall to the bottom of the column. 7. Remove the mouthpiece from your mouth and breathe out normally. 8. Rest for a few seconds and repeat Steps 1 through 7 at least 10 times every 1-2 hours when you are awake. Take your time and take a few normal breaths between deep breaths. 9. The spirometer may include an indicator to show  your best effort. Use the indicator as a goal to work toward during each repetition. 10. After each set of 10 deep breaths, practice coughing to be sure your lungs are clear. If you have an incision (the cut made at the time of surgery),  support your incision when coughing by placing a pillow or rolled up towels firmly against it. Once you are able to get out of bed, walk around indoors and cough well. You may stop using the incentive spirometer when instructed by your caregiver.  RISKS AND COMPLICATIONS  Take your time so you do not get dizzy or light-headed.  If you are in pain, you may need to take or ask for pain medication before doing incentive spirometry. It is harder to take a deep breath if you are having pain. AFTER USE  Rest and breathe slowly and easily.  It can be helpful to keep track of a log of your progress. Your caregiver can provide you with a simple table to help with this. If you are using the spirometer at home, follow these instructions: [place] IF:   You are having difficultly using the spirometer.  You have trouble using the spirometer as often as instructed.  Your pain medication is not giving enough relief while using the spirometer.  You develop fever of 100.5 F (38.1 C) or higher. SEEK IMMEDIATE MEDICAL CARE IF:   You cough up bloody sputum that had not been present before.  You develop fever of 102 F (38.9 C) or greater.  You develop worsening pain at or near the incision site. MAKE SURE YOU:   Understand these instructions.  Will watch your condition.  Will get help right away if you are not doing well or get worse. Document Released: 02/04/2007 Document Revised: 12/17/2011 Document Reviewed: 04/07/2007 Exit[employer] Patient Information 2014 [employer], [place].   ________________________________________________________________________  WHAT IS A BLOOD TRANSFUSION? Blood Transfusion Information  A transfusion is the replacement of blood or some of its parts. Blood is made up of multiple cells which provide different functions.  Red blood cells carry oxygen and are used for blood loss replacement.  White blood cells fight against infection.  Platelets control  bleeding.  Plasma helps clot blood.  Other blood products are available for specialized needs, such as hemophilia or other clotting disorders. BEFORE THE TRANSFUSION  Who gives blood for transfusions?   Healthy volunteers who are fully evaluated to make sure their blood is safe. This is blood bank blood. Transfusion therapy is the safest it has ever been in the practice of medicine. Before blood is taken from a donor, a complete history is taken to make sure that person has no history of diseases nor engages in risky social behavior (examples are intravenous drug use or sexual activity with multiple partners). The donor's travel history is screened to minimize risk of transmitting infections, such as malaria. The donated blood is tested for signs of infectious diseases, such as HIV and hepatitis. The blood is then tested to be sure it is compatible with you in order to minimize the chance of a transfusion reaction. If you or a relative donates blood, this is often done in anticipation of surgery and is not appropriate for emergency situations. It takes many days to process the donated blood. RISKS AND COMPLICATIONS Although transfusion therapy is very safe and saves many lives, the main dangers of transfusion include:   Getting an infectious disease.  Developing a transfusion reaction. This is an allergic reaction to  something in the blood you were given. Every precaution is taken to prevent this. The decision to have a blood transfusion has been considered carefully by your caregiver before blood is given. Blood is not given unless the benefits outweigh the risks. AFTER THE TRANSFUSION  Right after receiving a blood transfusion, you will usually feel much better and more energetic. This is especially true if your red blood cells have gotten low (anemic). The transfusion raises the level of the red blood cells which carry oxygen, and this usually causes an energy increase.  The nurse  administering the transfusion will monitor you carefully for complications. HOME CARE INSTRUCTIONS  No special instructions are needed after a transfusion. You may find your energy is better. Speak with your caregiver about any limitations on activity for underlying diseases you may have. SEEK MEDICAL CARE IF:   Your condition is not improving after your transfusion.  You develop redness or irritation at the intravenous (IV) site. SEEK IMMEDIATE MEDICAL CARE IF:  Any of the following symptoms occur over the next 12 hours:  Shaking chills.  You have a temperature by mouth above 102 F (38.9 C), not controlled by medicine.  Chest, back, or muscle pain.  People around you feel you are not acting correctly or are confused.  Shortness of breath or difficulty breathing.  Dizziness and fainting.  You get a rash or develop hives.  You have a decrease in urine output.  Your urine turns a dark color or changes to pink, red, or brown. Any of the following symptoms occur over the next 10 days:  You have a temperature by mouth above 102 F (38.9 C), not controlled by medicine.  Shortness of breath.  Weakness after normal activity.  The white part of the eye turns yellow (jaundice).  You have a decrease in the amount of urine or are urinating less often.  Your urine turns a dark color or changes to pink, red, or brown. Document Released: 09/21/2000 Document Revised: 12/17/2011 Document Reviewed: 05/10/2008 [facility] Patient Information 2014 [place], [place].  _______________________________________________________________________

## 2017-05-31 ENCOUNTER — Telehealth: Payer: Self-pay | Admitting: Medical Oncology

## 2017-05-31 NOTE — Telephone Encounter (Signed)
err

## 2017-06-03 ENCOUNTER — Encounter (HOSPITAL_COMMUNITY)
Admission: RE | Admit: 2017-06-03 | Discharge: 2017-06-03 | Disposition: A | Discharge status: Home / Self Care | Payer: BLUE CROSS/BLUE SHIELD | Source: Ambulatory Visit | Attending: Gynecologic Oncology | Admitting: Gynecologic Oncology

## 2017-06-03 ENCOUNTER — Encounter (HOSPITAL_COMMUNITY): Payer: Self-pay

## 2017-06-03 ENCOUNTER — Ambulatory Visit (HOSPITAL_BASED_OUTPATIENT_CLINIC_OR_DEPARTMENT_OTHER): Payer: BLUE CROSS/BLUE SHIELD | Admitting: Gynecologic Oncology

## 2017-06-03 ENCOUNTER — Encounter (INDEPENDENT_AMBULATORY_CARE_PROVIDER_SITE_OTHER): Payer: Self-pay

## 2017-06-03 ENCOUNTER — Encounter: Payer: Self-pay | Admitting: Gynecologic Oncology

## 2017-06-03 VITALS — BP 134/79 | HR 70 | Temp 98.2°F | Resp 18 | Ht 62.0 in | Wt 145.8 lb

## 2017-06-03 DIAGNOSIS — D251 Intramural leiomyoma of uterus: Secondary | ICD-10-CM

## 2017-06-03 DIAGNOSIS — G43909 Migraine, unspecified, not intractable, without status migrainosus: Secondary | ICD-10-CM | POA: Insufficient documentation

## 2017-06-03 DIAGNOSIS — Z7982 Long term (current) use of aspirin: Secondary | ICD-10-CM | POA: Diagnosis not present

## 2017-06-03 DIAGNOSIS — D649 Anemia, unspecified: Secondary | ICD-10-CM | POA: Insufficient documentation

## 2017-06-03 DIAGNOSIS — N921 Excessive and frequent menstruation with irregular cycle: Secondary | ICD-10-CM

## 2017-06-03 DIAGNOSIS — F329 Major depressive disorder, single episode, unspecified: Secondary | ICD-10-CM | POA: Diagnosis not present

## 2017-06-03 DIAGNOSIS — R102 Pelvic and perineal pain: Secondary | ICD-10-CM

## 2017-06-03 DIAGNOSIS — J45909 Unspecified asthma, uncomplicated: Secondary | ICD-10-CM | POA: Insufficient documentation

## 2017-06-03 HISTORY — DX: Anemia, unspecified: D64.9

## 2017-06-03 LAB — COMPREHENSIVE METABOLIC PANEL
ALBUMIN: 4.8 g/dL (ref 3.5–5.0)
ALT: 20 U/L (ref 14–54)
AST: 23 U/L (ref 15–41)
Alkaline Phosphatase: 65 U/L (ref 38–126)
Anion gap: 9 (ref 5–15)
BUN: 16 mg/dL (ref 6–20)
CHLORIDE: 108 mmol/L (ref 101–111)
CO2: 21 mmol/L — AB (ref 22–32)
CREATININE: 0.82 mg/dL (ref 0.44–1.00)
Calcium: 9.7 mg/dL (ref 8.9–10.3)
GFR calc Af Amer: >60 mL/min (ref >60)
GFR calc non Af Amer: >60 mL/min (ref >60)
Glucose, Bld: 89 mg/dL (ref 65–99)
Potassium: 3.8 mmol/L (ref 3.5–5.1)
SODIUM: 138 mmol/L (ref 135–145)
Total Bilirubin: 0.5 mg/dL (ref 0.3–1.2)
Total Protein: 8.1 g/dL (ref 6.5–8.1)

## 2017-06-03 LAB — CBC
HCT: 40.2 % (ref 36.0–46.0)
Hemoglobin: 14.1 g/dL (ref 12.0–15.0)
MCH: 30.5 pg (ref 26.0–34.0)
MCHC: 35.1 g/dL (ref 30.0–36.0)
MCV: 87 fL (ref 78.0–100.0)
PLATELETS: 257 10*3/uL (ref 150–400)
RBC: 4.62 MIL/uL (ref 3.87–5.11)
RDW: 13.4 % (ref 11.5–15.5)
WBC: 6.7 10*3/uL (ref 4.0–10.5)

## 2017-06-03 LAB — URINALYSIS, ROUTINE W REFLEX MICROSCOPIC
BILIRUBIN URINE: NEGATIVE
Glucose, UA: NEGATIVE mg/dL
HGB URINE DIPSTICK: NEGATIVE
Ketones, ur: NEGATIVE mg/dL
Leukocytes, UA: NEGATIVE
Nitrite: NEGATIVE
PH: 6 (ref 5.0–8.0)
Protein, ur: NEGATIVE mg/dL
SPECIFIC GRAVITY, URINE: 1.02 (ref 1.005–1.030)

## 2017-06-03 LAB — HCG, SERUM, QUALITATIVE: Preg, Serum: NEGATIVE

## 2017-06-03 LAB — NO BLOOD PRODUCTS

## 2017-06-03 NOTE — Patient Instructions (Addendum)
PLAN ON DRINKING TWO BOTTLES OF MAGNESIUM CITRATE THE DAY BEFORE SURGERY STARTING AT 4PM.  AT THAT TIME, ONLY TAKE IN CLEAR LIQUIDS UNTIL MIDNIGHT.                           Preparing for your Surgery  Plan for surgery on June 11, 2017 with Dr. Everitt Amber at [place] will be scheduled for a robotic assisted total hysterectomy, bilateral salpingectomy, mini-laparotomy.  Pre-operative Testing -You will receive a phone call from presurgical testing at [facility] to arrange for a pre-operative testing appointment before your surgery.  This appointment normally occurs one to two weeks before your scheduled surgery.   -Bring your insurance card, copy of an advanced directive if applicable, medication list  -At that visit, you will be asked to sign a consent for a possible blood transfusion in case a transfusion becomes necessary during surgery.  The need for a blood transfusion is rare but having consent is a necessary part of your care.     -You should not be taking blood thinners or aspirin at least ten days prior to surgery unless instructed by your surgeon.  Day Before Surgery at [place] will be asked to take in a light diet the day before surgery.  Avoid carbonated beverages.  You will be advised to have nothing to eat or drink after midnight the evening before.                Eat a light diet the day before surgery.  Examples including soups, broths, toast, yogurt, mashed potatoes.  Things to avoid include carbonated beverages (fizzy beverages), raw fruits and raw vegetables, or beans.               If your bowels are filled with gas, your surgeon will have difficulty visualizing your pelvic organs which increases your surgical risks.  Your role in recovery Your role is to become active as soon as directed by your doctor, while still giving yourself time to heal.  Rest when you feel tired. You will be asked to do the following in order to speed  your recovery:  - Cough and breathe deeply. This helps toclear and expand your lungs and can prevent pneumonia. You may be given a spirometer to practice deep breathing. A staff member will show you how to use the spirometer. - Do mild physical activity. Walking or moving your legs help your circulation and body functions return to normal. A staff member will help you when you try to walk and will provide you with simple exercises. Do not try to get up or walk alone the first time. - Actively manage your pain. Managing your pain lets you move in comfort. We will ask you to rate your pain on a scale of zero to 10. It is your responsibility to tell your doctor or nurse where and how much you hurt so your pain can be treated.  Special Considerations -If you are diabetic, you may be placed on insulin after surgery to have closer control over your blood sugars to promote healing and recovery.  This does not mean that you will be discharged on insulin.  If applicable, your oral antidiabetics will be resumed when you are tolerating a solid diet.  -Your final pathology results from surgery should be available by the Friday after surgery and the results will be relayed to you when available.  Blood Transfusion Information WHAT IS A BLOOD TRANSFUSION?  A transfusion is the replacement of blood or some of its parts. Blood is made up of multiple cells which provide different functions.  Red blood cells carry oxygen and are used for blood loss replacement.  White blood cells fight against infection.  Platelets control bleeding.  Plasma helps clot blood.  Other blood products are available for specialized needs, such as hemophilia or other clotting disorders. BEFORE THE TRANSFUSION  Who gives blood for transfusions?   You may be able to donate blood to be used at a later date on yourself (autologous donation).  Relatives can be asked to donate blood. This is generally not any safer than if you  have received blood from a stranger. The same precautions are taken to ensure safety when a relative's blood is donated.  Healthy volunteers who are fully evaluated to make sure their blood is safe. This is blood bank blood. Transfusion therapy is the safest it has ever been in the practice of medicine. Before blood is taken from a donor, a complete history is taken to make sure that person has no history of diseases nor engages in risky social behavior (examples are intravenous drug use or sexual activity with multiple partners). The donor's travel history is screened to minimize risk of transmitting infections, such as malaria. The donated blood is tested for signs of infectious diseases, such as HIV and hepatitis. The blood is then tested to be sure it is compatible with you in order to minimize the chance   Blood Transfusion InformationA transfusion is the replacement of blood or some of its parts. Blood is made up of multiple cells which provide different functions.   Red blood cells carry oxygen and are used for blood loss replacement.  White blood cells fight against infection.  Platelets control bleeding.  Plasma helps clot blood.  Other blood products are available for specialized needs, such as hemophilia or other clotting disorders. BEFORE THE TRANSFUSION  Who gives blood for transfusions?   You may be able to donate blood to be used at a later date on yourself (autologous donation).  Relatives can be asked to donate blood. This is generally not any safer than if you have received blood from a stranger. The same precautions are taken to ensure safety when a relative's blood is donated.  Healthy volunteers who are fully evaluated to make sure their blood is safe. This is blood bank blood. Transfusion therapy is the safest it has ever been in the practice of medicine. Before blood is taken from a donor, a complete history is taken to make sure that person has no history of diseases  nor engages in risky social behavior (examples are intravenous drug use or sexual activity with multiple partners). The donor's travel history is screened to minimize risk of transmitting infections, such as malaria. The donated blood is tested for signs of infectious diseases, such as HIV and hepatitis. The blood is then tested to be sure it is compatible with you in order to minimize the chance of a transfusion reaction. If you or a relative donates blood, this is often done in anticipation of surgery and is not appropriate for emergency situations. It takes many days to process the donated blood. RISKS AND COMPLICATIONS Although transfusion therapy is very safe and saves many lives, the main dangers of transfusion include:   Getting an infectious disease.  Developing a transfusion reaction. This is an allergic reaction to something in the  blood you were given. Every precaution is taken to prevent this. The decision to have a blood transfusion has been considered carefully by your caregiver before blood is given. Blood is not given unless the benefits outweigh the risks.  Leuprolide injection What is this medicine? LEUPROLIDE (loo PROE lide) is a man-made hormone. It is used to treat the symptoms of prostate cancer. This medicine may also be used to treat children with early onset of puberty. It may be used for other hormonal conditions. This medicine may be used for other purposes; ask your health care provider or pharmacist if you have questions. COMMON BRAND NAME(S): Lupron What should I tell my health care provider before I take this medicine? They need to know if you have any of these conditions: -diabetes -heart disease or previous heart attack -high blood pressure -high cholesterol -pain or difficulty passing urine -spinal cord metastasis -stroke -tobacco smoker -an unusual or allergic reaction to leuprolide, benzyl alcohol, other medicines, foods, dyes, or preservatives -pregnant  or trying to get pregnant -breast-feeding How should I use this medicine? This medicine is for injection under the skin or into a muscle. You will be taught how to prepare and give this medicine. Use exactly as directed. Take your medicine at regular intervals. Do not take your medicine more often than directed. It is important that you put your used needles and syringes in a special sharps container. Do not put them in a trash can. If you do not have a sharps container, call your pharmacist or healthcare provider to get one. A special MedGuide will be given to you by the pharmacist with each prescription and refill. Be sure to read this information carefully each time. Talk to your pediatrician regarding the use of this medicine in children. While this medicine may be prescribed for children as young as 8 years for selected conditions, precautions do apply. Overdosage: If you think you have taken too much of this medicine contact a poison control center or emergency room at once. NOTE: This medicine is only for you. Do not share this medicine with others. What if I miss a dose? If you miss a dose, take it as soon as you can. If it is almost time for your next dose, take only that dose. Do not take double or extra doses. What may interact with this medicine? Do not take this medicine with any of the following medications: -chasteberry This medicine may also interact with the following medications: -herbal or dietary supplements, like black cohosh or DHEA -female hormones, like estrogens or progestins and birth control pills, patches, rings, or injections -female hormones, like testosterone This list may not describe all possible interactions. Give your health care provider a list of all the medicines, herbs, non-prescription drugs, or dietary supplements you use. Also tell them if you smoke, drink alcohol, or use illegal drugs. Some items may interact with your medicine. What should I watch for  while using this medicine? Visit your doctor or health care professional for regular checks on your progress. During the first week, your symptoms may get worse, but then will improve as you continue your treatment. You may get hot flashes, increased bone pain, increased difficulty passing urine, or an aggravation of nerve symptoms. Discuss these effects with your doctor or health care professional, some of them may improve with continued use of this medicine. Female patients may experience a menstrual cycle or spotting during the first 2 months of therapy with this medicine. If this continues,  contact your doctor or health care professional. What side effects may I notice from receiving this medicine? Side effects that you should report to your doctor or health care professional as soon as possible: -allergic reactions like skin rash, itching or hives, swelling of the face, lips, or tongue -breathing problems -chest pain -depression or memory disorders -pain in your legs or groin -pain at site where injected -severe headache -swelling of the feet and legs -visual changes -vomiting Side effects that usually do not require medical attention (report to your doctor or health care professional if they continue or are bothersome): -breast swelling or tenderness -decrease in sex drive or performance -diarrhea -hot flashes -loss of appetite -muscle, joint, or bone pains -nausea -redness or irritation at site where injected -skin problems or acne This list may not describe all possible side effects. Call your doctor for medical advice about side effects. You may report side effects to FDA at 1-800-FDA-1088. Where should I keep my medicine? Keep out of the reach of children. Store below 25 degrees C (77 degrees F). Do not freeze. Protect from light. Do not use if it is not clear or if there are particles present. Throw away any unused medicine after the expiration date. NOTE: This sheet is a  summary. It may not cover all possible information. If you have questions about this medicine, talk to your doctor, pharmacist, or health care provider.  2018 Elsevier/Gold Standard (2016-05-14 10:54:35)

## 2017-06-03 NOTE — Progress Notes (Signed)
Consult Note: Gyn-Onc  Consult was requested by Dr. Pamala Hurry and Dr Sherryl Barters the evaluation of Stephanie Hancock 51 y.o. female  CC:  Chief Complaint  Patient presents with  . Leiomyoma, intramural    Assessment/Plan:  [name]  is a 51 y.o.  year old with symptomatic uterine fibroids with anemia and menometorrhagia and pain. She is a Restaurant manager, fast food. Endometrial biopsy negative.  She is booked for a robotic assisted total hysterectomy for uterus >250gm, bilateral salpingectomy. I explained that hysterectomy carried with it particularly high risks for her because of her history of prior myomectomy (increased risk for adhesive disease and damage to GI or GU structures including need for reoperation), and her Jehovah's Witness status with anemia and bulky fibroid uterus which places her at risk for intraoperative or postoperative hemorrhage which cannot be controlled by transfusion (given her religious beliefs). I discussed that iron and Epo do not reverse acute blood loss anemia quickly enough to be life saving in critical situations. Her preop Hb is optimized at 14.1.   HPI: Stephanie Hancock is a 51 year old woman (P2) who is seen as a new patient for symptomatic uterine fibroids, menometorrhagia and pain. She is a Restaurant manager, fast food.  The patient has a long standing history of fibroids. She underwent laparoscopic myomectomy with minilaparotomy for specimen delivery in 2003. She did well postoperatively until 2017 when she began developing "severe pelvic pains" which are present all the time and worse with bowel movements and urination and lying flat at night. She also reports her menses becoming heavy at that time. Ultrasound confirmed multiple intramural fibroids. She was treated with 6 months of Depot Lupron until August, 2017. During that time the bleeding improved and the fibroids decreased in volume. After she came off the lupron her pain and menses became worse than ever. She was  started on Depot Provera which somewhat improved the bleeding symptoms but not the pain.  She was seen by Dr Quincy Simmonds on 03/29/17 and an endometrial biopsy was taken (not yet resulted). A TVUS showed a uterus measuring 12x9x9.2cm with multiple intramural fibroids.  The symptoms are quality of life limiting. She is a severe asthmatic but controlled on inhaled agents and not on oral steroids. She has had a laparoscopic myomectomy but no other abdominal surgeries. She has had 2 vaginal deliveries. She is treated for migraine headaches.  Interval Hx: Endometrial biopsy was benign. She was prescribed lupron. She became amenorrheic. Hb was 13 then 14.  Persistent mass symptoms.  Current Meds:  Outpatient Encounter Prescriptions as of 06/03/2017  Medication Sig  . albuterol (PROVENTIL HFA;VENTOLIN HFA) 108 (90 BASE) MCG/ACT inhaler Inhale 1-2 puffs into the lungs every 6 (six) hours as needed for wheezing or shortness of breath.  Marland Kitchen albuterol (PROVENTIL) (5 MG/ML) 0.5% nebulizer solution Take 2.5 mg by nebulization every 6 (six) hours as needed for wheezing or shortness of breath.  Marland Kitchen aspirin-acetaminophen-caffeine (EXCEDRIN MIGRAINE) 250-250-65 MG tablet Take 3 tablets by mouth 3 (three) times daily as needed for headache.   . Biotin 5000 MCG CAPS Take 5,000 mcg by mouth every evening.  . ferrous gluconate (FERGON) 225 (27 Fe) MG tablet Take 1 tablet (240 mg total) by mouth 3 (three) times daily with meals.  . flurbiprofen (ANSAID) 100 MG tablet Take 100 mg by mouth See admin instructions. TAKE 1 TABLET (100 MG) BY MOUTH DAILY AS NEEDED FOR MIGRAINE HEADACHES (DO NOT EXCEED 4 TABLETS IN A WEEK)  . fluticasone-salmeterol (ADVAIR HFA) 45-21 MCG/ACT inhaler  Inhale 2 puffs into the lungs 2 (two) times daily.  Marland Kitchen senna (SENOKOT) 8.6 MG TABS tablet Take 1 tablet (8.6 mg total) by mouth at bedtime. (Patient taking differently: Take 1 tablet by mouth daily as needed (for constipation). )  . SUMAtriptan (IMITREX)  25 MG tablet Take 25 mg by mouth every 2 (two) hours as needed for migraine (disintigating tablet). May repeat in 2 hours if headache persists or recurs.  Marland Kitchen zolmitriptan (ZOMIG-ZMT) 5 MG disintegrating tablet TK 1 T PO PRF MIGRAINE. MAY REPEAT IN 2 H PRN. NO MORE THAN 2 IN 24 H FOR 2 TO 3 DAYS   No facility-administered encounter medications on file as of 06/03/2017.     Allergy:  Allergies  Allergen Reactions  . Codeine Hives  . Kiwi Extract Swelling and Other (See Comments)    Tingling in throat/mouth swells.  . Levetiracetam Other (See Comments)    HALLUCINATIONS W/SUICIDAL IDEATION  . Other     NO BLOOD PRODUCTS   . Powder Itching and Other (See Comments)    LATEX GLOVES WITH POWDER CAUSES ITCHING    Social Hx:   Social History   Social History  . Marital status: Married    Spouse name: N/A  . Number of children: N/A  . Years of education: N/A   Occupational History  . Not on file.   Social History Main Topics  . Smoking status: Never Smoker  . Smokeless tobacco: Never Used  . Alcohol use 1.2 - 2.4 oz/week    2 - 4 Cans of beer per week     Comment: 1-2 beers per day  . Drug use: No  . Sexual activity: Yes    Birth control/ protection: None   Other Topics Concern  . Not on file   Social History Narrative  . No narrative on file    Past Surgical Hx:  Past Surgical History:  Procedure Laterality Date  . TUBAL LIGATION     then reversal  . UTERINE FIBROID SURGERY      Past Medical Hx:  Past Medical History:  Diagnosis Date  . Abnormal uterine bleeding   . Anemia   . Asthma   . Depression    hx of  . Fibroid   . Migraine    with aura  . Patient is Jehovah's Witness     Past Gynecological History:  Menometorrhagia. + SVD x 2 No LMP recorded. Patient is not currently having periods (Reason: Irregular Periods).  Family Hx:  Family History  Problem Relation Age of Onset  . Hypertension Mother   . Diabetes Mother   . Osteoporosis Mother   .  Hypertension Father   . Prostate cancer Father   . Heart attack Father   . Kidney cancer Sister     Review of Systems:  Constitutional  Feels  fatigued  ENT Normal appearing ears and nares bilaterally Skin/Breast  No rash, sores, jaundice, itching, dryness Cardiovascular  No chest pain, shortness of breath, or edema  Pulmonary  No cough or wheeze.  Gastro Intestinal  No nausea, vomitting, or diarrhoea. No bright red blood per rectum, no abdominal pain, change in bowel movement, or constipation.  Genito Urinary  No frequency, urgency, dysuria, + heavy vaginal bleeding Musculo Skeletal  No myalgia, arthralgia, joint swelling or pain  Neurologic  + headaches Psychology  No depression, anxiety, insomnia.   Vitals:  Blood pressure 134/79, pulse 70, temperature 98.2 F (36.8 C), resp. rate 18, height 5\' 2"  (1.575  m), weight 145 lb 12.8 oz (66.1 kg), SpO2 100 %.  Physical Exam: WD in NAD Neck  Supple NROM, without any enlargements.  Lymph Node Survey No cervical supraclavicular or inguinal adenopathy Cardiovascular  Pulse normal rate, regularity and rhythm. S1 and S2 normal.  Lungs  Clear to auscultation bilateraly, without wheezes/crackles/rhonchi. Good air movement.  Skin  No rash/lesions/breakdown  Psychiatry  Alert and oriented to person, place, and time  Abdomen  Normoactive bowel sounds, abdomen soft, non-tender and slightly overweight/obese without evidence of hernia. There is a palpable mass in the inferior aspect of her abdomen (below umbilicus, consistent with a 12 week size uterus). Back No CVA tenderness Genito Urinary  Vulva/vagina: Normal external female genitalia.  No lesions. No discharge or bleeding.  Bladder/urethra:  No lesions or masses, well supported bladder  Vagina: normal  Cervix: Normal appearing, no lesions.  Uterus: 12 week size, mobile, anterior left fibroid appreciated.   Adnexa: no discrete masses other than fibroids. Rectal  deferred   Extremities  No bilateral cyanosis, clubbing or edema.  Donaciano Eva, MD  06/03/2017, 5:13 PM

## 2017-06-03 NOTE — Progress Notes (Signed)
Blood refusal faxed to [facility] and dr Denman George fax confirmation received and placed on chart.

## 2017-06-05 ENCOUNTER — Telehealth: Payer: Self-pay | Admitting: *Deleted

## 2017-06-05 NOTE — Telephone Encounter (Signed)
Patient called and stated that "since I got back from the beach I have a place between my little toe and the next one. There is a red streak the goes to the middle top of the foot. Warm to the touch, sore, sand swollen. No known injury or fevers." Per Melissa APP contacted the patient and request that she make an appt with her PCP. Patient verbalized understanding. Patient will call the office back with the results from that visit.

## 2017-06-11 ENCOUNTER — Ambulatory Visit (HOSPITAL_COMMUNITY): Payer: BLUE CROSS/BLUE SHIELD | Admitting: Anesthesiology

## 2017-06-11 ENCOUNTER — Encounter (HOSPITAL_COMMUNITY)
Admission: RE | Disposition: A | Discharge status: Home / Self Care | Payer: Self-pay | Source: Ambulatory Visit | Attending: Gynecologic Oncology

## 2017-06-11 ENCOUNTER — Ambulatory Visit (HOSPITAL_COMMUNITY)
Admission: RE | Admit: 2017-06-11 | Discharge: 2017-06-12 | Disposition: A | Discharge status: Home / Self Care | Payer: BLUE CROSS/BLUE SHIELD | Source: Ambulatory Visit | Attending: Gynecologic Oncology | Admitting: Gynecologic Oncology

## 2017-06-11 ENCOUNTER — Encounter (HOSPITAL_COMMUNITY): Payer: Self-pay | Admitting: *Deleted

## 2017-06-11 DIAGNOSIS — D259 Leiomyoma of uterus, unspecified: Secondary | ICD-10-CM | POA: Diagnosis not present

## 2017-06-11 DIAGNOSIS — Z888 Allergy status to other drugs, medicaments and biological substances status: Secondary | ICD-10-CM | POA: Insufficient documentation

## 2017-06-11 DIAGNOSIS — D251 Intramural leiomyoma of uterus: Secondary | ICD-10-CM | POA: Diagnosis not present

## 2017-06-11 DIAGNOSIS — Q625 Duplication of ureter: Secondary | ICD-10-CM

## 2017-06-11 DIAGNOSIS — Z7982 Long term (current) use of aspirin: Secondary | ICD-10-CM | POA: Diagnosis not present

## 2017-06-11 DIAGNOSIS — N736 Female pelvic peritoneal adhesions (postinfective): Secondary | ICD-10-CM | POA: Diagnosis not present

## 2017-06-11 DIAGNOSIS — J45909 Unspecified asthma, uncomplicated: Secondary | ICD-10-CM | POA: Diagnosis not present

## 2017-06-11 DIAGNOSIS — N939 Abnormal uterine and vaginal bleeding, unspecified: Secondary | ICD-10-CM | POA: Diagnosis not present

## 2017-06-11 DIAGNOSIS — Z91040 Latex allergy status: Secondary | ICD-10-CM | POA: Insufficient documentation

## 2017-06-11 DIAGNOSIS — G43909 Migraine, unspecified, not intractable, without status migrainosus: Secondary | ICD-10-CM | POA: Insufficient documentation

## 2017-06-11 DIAGNOSIS — Z79899 Other long term (current) drug therapy: Secondary | ICD-10-CM | POA: Insufficient documentation

## 2017-06-11 DIAGNOSIS — D500 Iron deficiency anemia secondary to blood loss (chronic): Secondary | ICD-10-CM | POA: Diagnosis not present

## 2017-06-11 DIAGNOSIS — F329 Major depressive disorder, single episode, unspecified: Secondary | ICD-10-CM | POA: Insufficient documentation

## 2017-06-11 HISTORY — PX: ROBOTIC ASSISTED TOTAL HYSTERECTOMY: SHX6085

## 2017-06-11 SURGERY — HYSTERECTOMY, TOTAL, ROBOT-ASSISTED
Anesthesia: General | Laterality: Bilateral

## 2017-06-11 MED ORDER — SUGAMMADEX SODIUM 200 MG/2ML IV SOLN
INTRAVENOUS | Status: AC
  Filled 2017-06-11: qty 2

## 2017-06-11 MED ORDER — PHENYLEPHRINE 40 MCG/ML (10ML) SYRINGE FOR IV PUSH (FOR BLOOD PRESSURE SUPPORT)
PREFILLED_SYRINGE | INTRAVENOUS | Status: AC
  Filled 2017-06-11: qty 10

## 2017-06-11 MED ORDER — HYDROMORPHONE HCL-NACL 0.5-0.9 MG/ML-% IV SOSY
PREFILLED_SYRINGE | INTRAVENOUS | Status: AC
  Filled 2017-06-11: qty 1

## 2017-06-11 MED ORDER — SENNOSIDES-DOCUSATE SODIUM 8.6-50 MG PO TABS
2.0000 | ORAL_TABLET | Freq: Every day | ORAL | Status: DC
  Administered 2017-06-11: 2 via ORAL
  Filled 2017-06-11: qty 2

## 2017-06-11 MED ORDER — MEPERIDINE HCL 50 MG/ML IJ SOLN: 6.2500 mg | INTRAMUSCULAR | Status: DC | PRN

## 2017-06-11 MED ORDER — CEFAZOLIN SODIUM-DEXTROSE 2-4 GM/100ML-% IV SOLN
INTRAVENOUS | Status: AC
  Filled 2017-06-11: qty 100

## 2017-06-11 MED ORDER — HYDROMORPHONE HCL-NACL 0.5-0.9 MG/ML-% IV SOSY
0.2500 mg | PREFILLED_SYRINGE | INTRAVENOUS | Status: DC | PRN
  Administered 2017-06-11 (×4): 0.5 mg via INTRAVENOUS

## 2017-06-11 MED ORDER — FENTANYL CITRATE (PF) 100 MCG/2ML IJ SOLN: 20.0000 ug | INTRAMUSCULAR | Status: DC | PRN

## 2017-06-11 MED ORDER — ACETAMINOPHEN 500 MG PO TABS
1000.0000 mg | ORAL_TABLET | Freq: Four times a day (QID) | ORAL | Status: DC
  Administered 2017-06-11 – 2017-06-12 (×4): 1000 mg via ORAL
  Filled 2017-06-11 (×4): qty 2

## 2017-06-11 MED ORDER — BUPIVACAINE LIPOSOME 1.3 % IJ SUSP
20.0000 mL | Freq: Once | INTRAMUSCULAR | Status: AC
  Administered 2017-06-11: 20 mL
  Filled 2017-06-11: qty 20

## 2017-06-11 MED ORDER — SUGAMMADEX SODIUM 200 MG/2ML IV SOLN
INTRAVENOUS | Status: DC | PRN
  Administered 2017-06-11: 125 mg via INTRAVENOUS

## 2017-06-11 MED ORDER — DIPHENHYDRAMINE HCL 50 MG/ML IJ SOLN
INTRAMUSCULAR | Status: AC
  Filled 2017-06-11: qty 1

## 2017-06-11 MED ORDER — TRAMADOL HCL 50 MG PO TABS
100.0000 mg | ORAL_TABLET | Freq: Four times a day (QID) | ORAL | Status: DC | PRN
  Administered 2017-06-11 – 2017-06-12 (×3): 100 mg via ORAL
  Filled 2017-06-11 (×3): qty 2

## 2017-06-11 MED ORDER — ALBUTEROL SULFATE (2.5 MG/3ML) 0.083% IN NEBU: 2.5000 mg | INHALATION_SOLUTION | Freq: Four times a day (QID) | RESPIRATORY_TRACT | Status: DC | PRN

## 2017-06-11 MED ORDER — PROMETHAZINE HCL 25 MG/ML IJ SOLN: 6.2500 mg | INTRAMUSCULAR | Status: DC | PRN

## 2017-06-11 MED ORDER — ACETAMINOPHEN 10 MG/ML IV SOLN
INTRAVENOUS | Status: AC
  Filled 2017-06-11: qty 100

## 2017-06-11 MED ORDER — MIDAZOLAM HCL 2 MG/2ML IJ SOLN
INTRAMUSCULAR | Status: AC
  Filled 2017-06-11: qty 2

## 2017-06-11 MED ORDER — DEXAMETHASONE SODIUM PHOSPHATE 10 MG/ML IJ SOLN
INTRAMUSCULAR | Status: AC
  Filled 2017-06-11: qty 1

## 2017-06-11 MED ORDER — ACETAMINOPHEN 10 MG/ML IV SOLN
INTRAVENOUS | Status: DC | PRN
  Administered 2017-06-11: 1000 mg via INTRAVENOUS

## 2017-06-11 MED ORDER — LACTATED RINGERS IV SOLN
INTRAVENOUS | Status: DC | PRN
  Administered 2017-06-11 (×2): via INTRAVENOUS

## 2017-06-11 MED ORDER — ONDANSETRON HCL 4 MG PO TABS: 4.0000 mg | ORAL_TABLET | Freq: Four times a day (QID) | ORAL | Status: DC | PRN

## 2017-06-11 MED ORDER — ONDANSETRON HCL 4 MG/2ML IJ SOLN
INTRAMUSCULAR | Status: AC
  Filled 2017-06-11: qty 2

## 2017-06-11 MED ORDER — ALBUTEROL SULFATE HFA 108 (90 BASE) MCG/ACT IN AERS: 1.0000 | INHALATION_SPRAY | Freq: Four times a day (QID) | RESPIRATORY_TRACT | Status: DC | PRN

## 2017-06-11 MED ORDER — DICLOFENAC SODIUM 50 MG PO TBEC
50.0000 mg | DELAYED_RELEASE_TABLET | Freq: Four times a day (QID) | ORAL | Status: DC
  Administered 2017-06-11 – 2017-06-12 (×4): 50 mg via ORAL
  Filled 2017-06-11 (×6): qty 1

## 2017-06-11 MED ORDER — ONDANSETRON HCL 4 MG/2ML IJ SOLN
INTRAMUSCULAR | Status: DC | PRN
  Administered 2017-06-11: 4 mg via INTRAVENOUS

## 2017-06-11 MED ORDER — FENTANYL CITRATE (PF) 250 MCG/5ML IJ SOLN
INTRAMUSCULAR | Status: AC
  Filled 2017-06-11: qty 5

## 2017-06-11 MED ORDER — DIPHENHYDRAMINE HCL 50 MG/ML IJ SOLN
INTRAMUSCULAR | Status: DC | PRN
  Administered 2017-06-11: 12.5 mg via INTRAVENOUS

## 2017-06-11 MED ORDER — SCOPOLAMINE 1 MG/3DAYS TD PT72
MEDICATED_PATCH | TRANSDERMAL | Status: AC
  Filled 2017-06-11: qty 1

## 2017-06-11 MED ORDER — LIDOCAINE 2% (20 MG/ML) 5 ML SYRINGE
INTRAMUSCULAR | Status: DC | PRN
  Administered 2017-06-11: 50 mg via INTRAVENOUS

## 2017-06-11 MED ORDER — FENTANYL CITRATE (PF) 100 MCG/2ML IJ SOLN
INTRAMUSCULAR | Status: DC | PRN
  Administered 2017-06-11: 25 ug via INTRAVENOUS
  Administered 2017-06-11 (×3): 50 ug via INTRAVENOUS
  Administered 2017-06-11: 25 ug via INTRAVENOUS
  Administered 2017-06-11: 50 ug via INTRAVENOUS
  Administered 2017-06-11: 100 ug via INTRAVENOUS

## 2017-06-11 MED ORDER — FENTANYL CITRATE (PF) 100 MCG/2ML IJ SOLN
INTRAMUSCULAR | Status: AC
  Filled 2017-06-11: qty 2

## 2017-06-11 MED ORDER — BUPIVACAINE HCL (PF) 0.25 % IJ SOLN
INTRAMUSCULAR | Status: DC | PRN
  Administered 2017-06-11: 20 mL

## 2017-06-11 MED ORDER — METOCLOPRAMIDE HCL 5 MG/ML IJ SOLN
INTRAMUSCULAR | Status: AC
  Filled 2017-06-11: qty 2

## 2017-06-11 MED ORDER — DEXAMETHASONE SODIUM PHOSPHATE 4 MG/ML IJ SOLN
INTRAMUSCULAR | Status: DC | PRN
  Administered 2017-06-11: 10 mg via INTRAVENOUS

## 2017-06-11 MED ORDER — LACTATED RINGERS IR SOLN
Status: DC | PRN
  Administered 2017-06-11: 1000 mL

## 2017-06-11 MED ORDER — HYDROMORPHONE HCL-NACL 0.5-0.9 MG/ML-% IV SOSY
PREFILLED_SYRINGE | INTRAVENOUS | Status: AC
  Filled 2017-06-11: qty 3

## 2017-06-11 MED ORDER — MIDAZOLAM HCL 2 MG/2ML IJ SOLN: 0.5000 mg | Freq: Once | INTRAMUSCULAR | Status: DC | PRN

## 2017-06-11 MED ORDER — ROCURONIUM BROMIDE 50 MG/5ML IV SOSY
PREFILLED_SYRINGE | INTRAVENOUS | Status: AC
  Filled 2017-06-11: qty 5

## 2017-06-11 MED ORDER — LIDOCAINE 2% (20 MG/ML) 5 ML SYRINGE
INTRAMUSCULAR | Status: AC
  Filled 2017-06-11: qty 5

## 2017-06-11 MED ORDER — STERILE WATER FOR IRRIGATION IR SOLN
Status: DC | PRN
  Administered 2017-06-11: 1000 mL

## 2017-06-11 MED ORDER — ONDANSETRON HCL 4 MG/2ML IJ SOLN: 4.0000 mg | Freq: Four times a day (QID) | INTRAMUSCULAR | Status: DC | PRN

## 2017-06-11 MED ORDER — GABAPENTIN 300 MG PO CAPS
600.0000 mg | ORAL_CAPSULE | Freq: Every day | ORAL | Status: AC
  Administered 2017-06-11: 600 mg via ORAL
  Filled 2017-06-11: qty 2

## 2017-06-11 MED ORDER — SCOPOLAMINE 1 MG/3DAYS TD PT72
MEDICATED_PATCH | TRANSDERMAL | Status: DC | PRN
  Administered 2017-06-11: 1 via TRANSDERMAL

## 2017-06-11 MED ORDER — KCL IN DEXTROSE-NACL 20-5-0.45 MEQ/L-%-% IV SOLN
INTRAVENOUS | Status: DC
  Administered 2017-06-11: 50 mL/h via INTRAVENOUS
  Filled 2017-06-11 (×2): qty 1000

## 2017-06-11 MED ORDER — PROPOFOL 10 MG/ML IV BOLUS
INTRAVENOUS | Status: AC
  Filled 2017-06-11: qty 20

## 2017-06-11 MED ORDER — PHENYLEPHRINE 40 MCG/ML (10ML) SYRINGE FOR IV PUSH (FOR BLOOD PRESSURE SUPPORT)
PREFILLED_SYRINGE | INTRAVENOUS | Status: DC | PRN
  Administered 2017-06-11: 40 ug via INTRAVENOUS
  Administered 2017-06-11: 80 ug via INTRAVENOUS
  Administered 2017-06-11: 120 ug via INTRAVENOUS
  Administered 2017-06-11 (×2): 80 ug via INTRAVENOUS

## 2017-06-11 MED ORDER — MIDAZOLAM HCL 5 MG/5ML IJ SOLN
INTRAMUSCULAR | Status: DC | PRN
  Administered 2017-06-11 (×2): 1 mg via INTRAVENOUS

## 2017-06-11 MED ORDER — BUPIVACAINE HCL (PF) 0.25 % IJ SOLN
INTRAMUSCULAR | Status: AC
  Filled 2017-06-11: qty 30

## 2017-06-11 MED ORDER — PROPOFOL 10 MG/ML IV BOLUS
INTRAVENOUS | Status: DC | PRN
  Administered 2017-06-11: 50 mg via INTRAVENOUS
  Administered 2017-06-11: 100 mg via INTRAVENOUS

## 2017-06-11 MED ORDER — ROCURONIUM BROMIDE 50 MG/5ML IV SOSY
PREFILLED_SYRINGE | INTRAVENOUS | Status: DC | PRN
  Administered 2017-06-11 (×2): 10 mg via INTRAVENOUS
  Administered 2017-06-11: 50 mg via INTRAVENOUS
  Administered 2017-06-11: 10 mg via INTRAVENOUS

## 2017-06-11 MED ORDER — CEFAZOLIN SODIUM-DEXTROSE 2-4 GM/100ML-% IV SOLN
2.0000 g | INTRAVENOUS | Status: AC
  Administered 2017-06-11: 2 g via INTRAVENOUS

## 2017-06-11 MED ORDER — METOCLOPRAMIDE HCL 5 MG/ML IJ SOLN
INTRAMUSCULAR | Status: DC | PRN
  Administered 2017-06-11: 10 mg via INTRAVENOUS

## 2017-06-11 SURGICAL SUPPLY — 47 items
APPLICATOR SURGIFLO ENDO (HEMOSTASIS) IMPLANT
BAG LAPAROSCOPIC 12 15 PORT 16 (BASKET) ×1 IMPLANT
BAG RETRIEVAL 12/15 (BASKET) ×2
CELLS DAT CNTRL 66122 CELL SVR (MISCELLANEOUS) ×1 IMPLANT
COVER BACK TABLE 60X90IN (DRAPES) ×2 IMPLANT
COVER TIP SHEARS 8 DVNC (MISCELLANEOUS) ×1 IMPLANT
COVER TIP SHEARS 8MM DA VINCI (MISCELLANEOUS) ×1
DERMABOND ADVANCED (GAUZE/BANDAGES/DRESSINGS) ×1
DERMABOND ADVANCED .7 DNX12 (GAUZE/BANDAGES/DRESSINGS) ×1 IMPLANT
DRAPE ARM DVNC X/XI (DISPOSABLE) ×4 IMPLANT
DRAPE COLUMN DVNC XI (DISPOSABLE) ×1 IMPLANT
DRAPE DA VINCI XI ARM (DISPOSABLE) ×4
DRAPE DA VINCI XI COLUMN (DISPOSABLE) ×1
DRAPE SHEET LG 3/4 BI-LAMINATE (DRAPES) ×2 IMPLANT
DRAPE SURG IRRIG POUCH 19X23 (DRAPES) ×2 IMPLANT
ELECT PENCIL ROCKER SW 15FT (MISCELLANEOUS) ×2 IMPLANT
ELECT REM PT RETURN 15FT ADLT (MISCELLANEOUS) ×2 IMPLANT
GLOVE BIO SURGEON STRL SZ 6 (GLOVE) ×8 IMPLANT
GLOVE BIO SURGEON STRL SZ 6.5 (GLOVE) ×4 IMPLANT
GOWN STRL REUS W/ TWL LRG LVL3 (GOWN DISPOSABLE) ×2 IMPLANT
GOWN STRL REUS W/TWL LRG LVL3 (GOWN DISPOSABLE) ×2
HOLDER FOLEY CATH W/STRAP (MISCELLANEOUS) ×2 IMPLANT
IRRIG SUCT STRYKERFLOW 2 WTIP (MISCELLANEOUS) ×2
IRRIGATION SUCT STRKRFLW 2 WTP (MISCELLANEOUS) ×1 IMPLANT
MANIPULATOR UTERINE 4.5 ZUMI (MISCELLANEOUS) ×2 IMPLANT
OBTURATOR OPTICAL STANDARD 8MM (TROCAR) ×1
OBTURATOR OPTICAL STND 8 DVNC (TROCAR) ×1
OBTURATOR OPTICALSTD 8 DVNC (TROCAR) ×1 IMPLANT
PACK ROBOT GYN CUSTOM WL (TRAY / TRAY PROCEDURE) ×2 IMPLANT
PAD POSITIONING PINK XL (MISCELLANEOUS) ×2 IMPLANT
POUCH SPECIMEN RETRIEVAL 10MM (ENDOMECHANICALS) IMPLANT
RTRCTR WOUND ALEXIS 18CM MED (MISCELLANEOUS) ×2
SEAL CANN UNIV 5-8 DVNC XI (MISCELLANEOUS) ×4 IMPLANT
SEAL XI 5MM-8MM UNIVERSAL (MISCELLANEOUS) ×4
SET TRI-LUMEN FLTR TB AIRSEAL (TUBING) ×2 IMPLANT
SOLUTION ELECTROLUBE (MISCELLANEOUS) ×2 IMPLANT
SURGIFLO W/THROMBIN 8M KIT (HEMOSTASIS) IMPLANT
SUT PROLENE 5 0 CC 1 (SUTURE) IMPLANT
SUT VIC AB 0 CT1 27 (SUTURE) ×5
SUT VIC AB 0 CT1 27XBRD ANTBC (SUTURE) ×5 IMPLANT
SUT VIC AB 3-0 SH 27 (SUTURE) ×3
SUT VIC AB 3-0 SH 27X BRD (SUTURE) ×3 IMPLANT
TOWEL OR NON WOVEN STRL DISP B (DISPOSABLE) ×2 IMPLANT
TRAP SPECIMEN MUCOUS 40CC (MISCELLANEOUS) IMPLANT
TRAY FOLEY W/METER SILVER 16FR (SET/KITS/TRAYS/PACK) ×2 IMPLANT
UNDERPAD 30X30 (UNDERPADS AND DIAPERS) ×2 IMPLANT
WATER STERILE IRR 1000ML POUR (IV SOLUTION) ×2 IMPLANT

## 2017-06-11 NOTE — Interval H&P Note (Signed)
History and Physical Interval Note:  06/11/2017 7:17 AM  Stephanie Hancock  has presented today for surgery, with the diagnosis of symptomatic uterine fibroids  The various methods of treatment have been discussed with the patient and family. After consideration of risks, benefits and other options for treatment, the patient has consented to  Procedure(s): XI ROBOTIC ASSISTED TOTAL HYSTERECTOMY BILATERAL SALPINGECTOMY MINI LAPAROTOMY (Bilateral) as a surgical intervention .  The patient's history has been reviewed, patient examined, no change in status, stable for surgery.  I have reviewed the patient's chart and labs.  Questions were answered to the patient's satisfaction.     Donaciano Eva

## 2017-06-11 NOTE — Anesthesia Procedure Notes (Signed)
Procedure Name: Intubation Date/Time: 06/11/2017 7:40 AM Performed by: Deliah Boston Pre-anesthesia Checklist: Patient identified, Emergency Drugs available, Suction available and Patient being monitored Patient Re-evaluated:Patient Re-evaluated prior to induction Oxygen Delivery Method: Circle system utilized Preoxygenation: Pre-oxygenation with 100% oxygen Induction Type: IV induction Ventilation: Mask ventilation without difficulty Laryngoscope Size: Mac and 3 Grade View: Grade I Tube type: Oral Tube size: 7.0 mm Number of attempts: 1 Airway Equipment and Method: Stylet and Oral airway Placement Confirmation: ETT inserted through vocal cords under direct vision,  positive ETCO2 and breath sounds checked- equal and bilateral Secured at: 21 cm Tube secured with: Tape Dental Injury: Teeth and Oropharynx as per pre-operative assessment

## 2017-06-11 NOTE — Anesthesia Preprocedure Evaluation (Addendum)
Anesthesia Evaluation  Patient identified by MRN, date of birth, ID band Patient awake    Reviewed: Allergy & Precautions, NPO status , Patient's Chart, lab work & pertinent test results  History of Anesthesia Complications Negative for: history of anesthetic complications  Airway Mallampati: II  TM Distance: >3 FB Neck ROM: Full    Dental  (+) Dental Advisory Given   Pulmonary asthma ,    breath sounds clear to auscultation       Cardiovascular (-) anginanegative cardio ROS   Rhythm:Regular Rate:Normal     Neuro/Psych  Headaches, Depression    GI/Hepatic negative GI ROS, Neg liver ROS,   Endo/Other  negative endocrine ROS  Renal/GU negative Renal ROS     Musculoskeletal   Abdominal   Peds  Hematology  (+) JEHOVAH'S WITNESS (accepts albumin)  Anesthesia Other Findings   Reproductive/Obstetrics                            Anesthesia Physical Anesthesia Plan  ASA: II  Anesthesia Plan: General   Post-op Pain Management:    Induction:   PONV Risk Score and Plan: 4 or greater and Ondansetron, Dexamethasone, Midazolam and Scopolamine patch - Pre-op  Airway Management Planned: Oral ETT  Additional Equipment:   Intra-op Plan:   Post-operative Plan: Extubation in OR  Informed Consent: I have reviewed the patients History and Physical, chart, labs and discussed the procedure including the risks, benefits and alternatives for the proposed anesthesia with the patient or authorized representative who has indicated his/her understanding and acceptance.   Dental advisory given  Plan Discussed with: CRNA and Surgeon  Anesthesia Plan Comments: (Plan routine monitors, GETA)       Anesthesia Quick Evaluation

## 2017-06-11 NOTE — Op Note (Signed)
OPERATIVE NOTE 06/11/17  Surgeon: Donaciano Eva   Assistants: Dr Lahoma Crocker (an MD assistant was necessary for tissue manipulation, management of robotic instrumentation, retraction and positioning due to the complexity of the case and hospital policies).   Anesthesia: General endotracheal  ASA Class: 3   Pre-operative Diagnosis: symptomatic uterine bleeding.  Post-operative Diagnosis: same ,  Duplicated right ureter.  Operation: Robotic-assisted laparoscopic total hysterectomy >150gm with right salpingoophorectomy and left salpingectomy, with minilaparotomy, lysis of adhesions.  Surgeon: Donaciano Eva  Assistant Surgeon: Lahoma Crocker MD  Anesthesia: GET  Urine Output: 200cc  Operative Findings:  : 14cm bulky, broad fibroid uterus with dense adhesions between ileum and posterior uterus and sigmoid to posterior uterus. Duplicated right ureter.   Estimated Blood Loss:  less than 50 mL      Total IV Fluids: 1,000 ml         Specimens: uterus, cervix,  right tube and ovary, left fallopian tube.         Complications:  None; patient tolerated the procedure well.         Disposition: PACU - hemodynamically stable.  Procedure Details  The patient was seen in the Holding Room. The risks, benefits, complications, treatment options, and expected outcomes were discussed with the patient.  The patient concurred with the proposed plan, giving informed consent.  The site of surgery properly noted/marked. The patient was identified as Stephanie Hancock and the procedure verified as a Robotic-assisted hysterectomy with bilateral salpingo oophorectomy. A Time Out was held and the above information confirmed.  After induction of anesthesia, the patient was draped and prepped in the usual sterile manner. Pt was placed in supine position after anesthesia and draped and prepped in the usual sterile manner. The abdominal drape was placed after the CholoraPrep had been  allowed to dry for 3 minutes.  Her arms were tucked to her side with all appropriate precautions.  The shoulders were stabilized with padded shoulder blocks applied to the acromium processes.  The patient was placed in the semi-lithotomy position in [place].  The perineum was prepped with Betadine. The patient was then prepped. Foley catheter was placed.  A sterile speculum was placed in the vagina.  The cervix was grasped with a single-tooth tenaculum and dilated with Kennon Rounds dilators.  The ZUMI uterine manipulator with a medium colpotomizer ring was placed without difficulty.  A pneum occluder balloon was placed over the manipulator.  OG tube placement was confirmed and to suction.   Next, a 5 mm skin incision was made 1 cm below the subcostal margin in the midclavicular line.  The 5 mm Optiview port and scope was used for direct entry.  Opening pressure was under 10 mm CO2.  The abdomen was insufflated and the findings were noted as above.   At this point and all points during the procedure, the patient's intra-abdominal pressure did not exceed 15 mmHg. Next, a 10 mm skin incision was made in the umbilicus and a right and left port was placed about 10 cm lateral to the robot port on the right and left side.  A fourth arm was placed in the left lower quadrant 2 cm above and superior and medial to the anterior superior iliac spine.  All ports were placed under direct visualization.  The patient was placed in steep Trendelenburg.  Bowel was folded away into the upper abdomen.  The robot was docked in the normal manner.  For 20 minutes sharp adhesiolysis was performed  to separate bowels from uterine serosa. No breech of the bowel wall was apparent though there was oozing at the serosal surfaces. This was made hemostatic with interrupted 3-0 vicryl imbricating sutures.  The hysterectomy was started after the round ligament on the right side was incised and the retroperitoneum was entered and the pararectal  space was developed. A duplicated right ureter was noted to be on the medial leaf of the broad ligament.  The right ovary was densely adherent to the posterior uterus and unable to be spared/dissected off of the posterior peritoneum. A decision was made to take it with the uterine specimen to avoid bleeding. The peritoneum above the ureter was incised and stretched and the infundibulopelvic ligament was skeletonized, cauterized and cut.  The posterior peritoneum was taken down to the level of the KOH ring.  The anterior peritoneum was also taken down.  The bladder flap was created to the level of the KOH ring.  The uterine artery on the right side was skeletonized, cauterized and cut in the normal manner.  A similar procedure was performed on the left.A singular ureter was noted on the left. However, the left ovary was spared and dissected off of the posterior uterus. The fallopian tube was dissected off of the ovary and kept with the uterine specimen. The colpotomy was made and the uterus, cervix, bilateral ovaries and tubes were amputated and placed in the endocatch bag.  Pedicles were inspected and excellent hemostasis was achieved.    The colpotomy at the vaginal cuff was closed with Vicryl on a CT1 needle in a figure of 8 manner.  Irrigation was used and excellent hemostasis was achieved.  At this point in the robotic portion of the procedure was completed.  Robotic instruments were removed under direct visulaization.  The robot was undocked.   A 6cm suprapubic low vertical incision was made with the scalpel. The subcutaneous skin was opened with the bovie. The fascia was opened with the bovie verrtically, and the rectus muscles were dissected off of the fascia inferiorally and superiorally. The peritoneum was opened sharply in the midline. The peritoneal incision was extended. The uterine specimen in the endocatch bag was retrieved through the incision. The fascia was closed with running 0-vicryl. The  subcutaneous fat was closed with 2-0 vicryl. 20cc of exparel mixed with 20cc of marcaine was infiltrated into the incision. The incision was closed at the skin with monocryl and dermabond. The 10 mm ports were closed with Vicryl on a UR-5 needle and the fascia was closed with 0 Vicryl on a UR-5 needle.  The skin was closed with 4-0 Vicryl in a subcuticular manner.  Dermabond was applied.  Sponge, lap and needle counts correct x 2.  The patient was taken to the recovery room in stable condition.  The vagina was swabbed with  minimal bleeding noted.   All instrument and needle counts were correct x  3.   The patient was transferred to the recovery room in a stable condition.  Donaciano Eva, MD

## 2017-06-11 NOTE — Anesthesia Postprocedure Evaluation (Signed)
Anesthesia Post Note  Patient: Remedy Corporan  Procedure(s) Performed: Procedure(s) (LRB): XI ROBOTIC ASSISTED TOTAL HYSTERECTOMY BILATERAL SALPINGECTOMY, RIGHT OOPHERECTOMY,  LYSIS OF ADHESIONS ,MINI LAPAROTOMY (Bilateral)     Patient location during evaluation: PACU Anesthesia Type: General Level of consciousness: awake and alert, patient cooperative and oriented Pain management: pain level controlled Vital Signs Assessment: post-procedure vital signs reviewed and stable Respiratory status: spontaneous breathing, nonlabored ventilation, respiratory function stable and patient connected to nasal cannula oxygen Cardiovascular status: blood pressure returned to baseline and stable Postop Assessment: no signs of nausea or vomiting Anesthetic complications: no    Last Vitals:  Vitals:   06/11/17 1438 06/11/17 1533  BP: 110/70 117/73  Pulse: 80 79  Resp: 14 14  Temp: 36.7 C 36.8 C  SpO2: 100% 100%    Last Pain:  Vitals:   06/11/17 1533  TempSrc: Oral  PainSc:                  Nayomi Tabron,E. Sanaai Doane

## 2017-06-11 NOTE — H&P (View-Only) (Signed)
Consult Note: Gyn-Onc  Consult was requested by Dr. Pamala Hurry and Dr Sherryl Barters the evaluation of Stephanie Hancock 51 y.o. female  CC:  Chief Complaint  Patient presents with  . Leiomyoma, intramural    Assessment/Plan:  [name]  is a 51 y.o.  year old with symptomatic uterine fibroids with anemia and menometorrhagia and pain. She is a Restaurant manager, fast food. Endometrial biopsy negative.  She is booked for a robotic assisted total hysterectomy for uterus >250gm, bilateral salpingectomy. I explained that hysterectomy carried with it particularly high risks for her because of her history of prior myomectomy (increased risk for adhesive disease and damage to GI or GU structures including need for reoperation), and her Jehovah's Witness status with anemia and bulky fibroid uterus which places her at risk for intraoperative or postoperative hemorrhage which cannot be controlled by transfusion (given her religious beliefs). I discussed that iron and Epo do not reverse acute blood loss anemia quickly enough to be life saving in critical situations. Her preop Hb is optimized at 14.1.   HPI: Stephanie Hancock is a 51 year old woman (P2) who is seen as a new patient for symptomatic uterine fibroids, menometorrhagia and pain. She is a Restaurant manager, fast food.  The patient has a long standing history of fibroids. She underwent laparoscopic myomectomy with minilaparotomy for specimen delivery in 2003. She did well postoperatively until 2017 when she began developing "severe pelvic pains" which are present all the time and worse with bowel movements and urination and lying flat at night. She also reports her menses becoming heavy at that time. Ultrasound confirmed multiple intramural fibroids. She was treated with 6 months of Depot Lupron until August, 2017. During that time the bleeding improved and the fibroids decreased in volume. After she came off the lupron her pain and menses became worse than ever. She was  started on Depot Provera which somewhat improved the bleeding symptoms but not the pain.  She was seen by Dr Quincy Simmonds on 03/29/17 and an endometrial biopsy was taken (not yet resulted). A TVUS showed a uterus measuring 12x9x9.2cm with multiple intramural fibroids.  The symptoms are quality of life limiting. She is a severe asthmatic but controlled on inhaled agents and not on oral steroids. She has had a laparoscopic myomectomy but no other abdominal surgeries. She has had 2 vaginal deliveries. She is treated for migraine headaches.  Interval Hx: Endometrial biopsy was benign. She was prescribed lupron. She became amenorrheic. Hb was 13 then 14.  Persistent mass symptoms.  Current Meds:  Outpatient Encounter Prescriptions as of 06/03/2017  Medication Sig  . albuterol (PROVENTIL HFA;VENTOLIN HFA) 108 (90 BASE) MCG/ACT inhaler Inhale 1-2 puffs into the lungs every 6 (six) hours as needed for wheezing or shortness of breath.  Marland Kitchen albuterol (PROVENTIL) (5 MG/ML) 0.5% nebulizer solution Take 2.5 mg by nebulization every 6 (six) hours as needed for wheezing or shortness of breath.  Marland Kitchen aspirin-acetaminophen-caffeine (EXCEDRIN MIGRAINE) 250-250-65 MG tablet Take 3 tablets by mouth 3 (three) times daily as needed for headache.   . Biotin 5000 MCG CAPS Take 5,000 mcg by mouth every evening.  . ferrous gluconate (FERGON) 225 (27 Fe) MG tablet Take 1 tablet (240 mg total) by mouth 3 (three) times daily with meals.  . flurbiprofen (ANSAID) 100 MG tablet Take 100 mg by mouth See admin instructions. TAKE 1 TABLET (100 MG) BY MOUTH DAILY AS NEEDED FOR MIGRAINE HEADACHES (DO NOT EXCEED 4 TABLETS IN A WEEK)  . fluticasone-salmeterol (ADVAIR HFA) 45-21 MCG/ACT inhaler  Inhale 2 puffs into the lungs 2 (two) times daily.  Marland Kitchen senna (SENOKOT) 8.6 MG TABS tablet Take 1 tablet (8.6 mg total) by mouth at bedtime. (Patient taking differently: Take 1 tablet by mouth daily as needed (for constipation). )  . SUMAtriptan (IMITREX)  25 MG tablet Take 25 mg by mouth every 2 (two) hours as needed for migraine (disintigating tablet). May repeat in 2 hours if headache persists or recurs.  Marland Kitchen zolmitriptan (ZOMIG-ZMT) 5 MG disintegrating tablet TK 1 T PO PRF MIGRAINE. MAY REPEAT IN 2 H PRN. NO MORE THAN 2 IN 24 H FOR 2 TO 3 DAYS   No facility-administered encounter medications on file as of 06/03/2017.     Allergy:  Allergies  Allergen Reactions  . Codeine Hives  . Kiwi Extract Swelling and Other (See Comments)    Tingling in throat/mouth swells.  . Levetiracetam Other (See Comments)    HALLUCINATIONS W/SUICIDAL IDEATION  . Other     NO BLOOD PRODUCTS   . Powder Itching and Other (See Comments)    LATEX GLOVES WITH POWDER CAUSES ITCHING    Social Hx:   Social History   Social History  . Marital status: Married    Spouse name: N/A  . Number of children: N/A  . Years of education: N/A   Occupational History  . Not on file.   Social History Main Topics  . Smoking status: Never Smoker  . Smokeless tobacco: Never Used  . Alcohol use 1.2 - 2.4 oz/week    2 - 4 Cans of beer per week     Comment: 1-2 beers per day  . Drug use: No  . Sexual activity: Yes    Birth control/ protection: None   Other Topics Concern  . Not on file   Social History Narrative  . No narrative on file    Past Surgical Hx:  Past Surgical History:  Procedure Laterality Date  . TUBAL LIGATION     then reversal  . UTERINE FIBROID SURGERY      Past Medical Hx:  Past Medical History:  Diagnosis Date  . Abnormal uterine bleeding   . Anemia   . Asthma   . Depression    hx of  . Fibroid   . Migraine    with aura  . Patient is Jehovah's Witness     Past Gynecological History:  Menometorrhagia. + SVD x 2 No LMP recorded. Patient is not currently having periods (Reason: Irregular Periods).  Family Hx:  Family History  Problem Relation Age of Onset  . Hypertension Mother   . Diabetes Mother   . Osteoporosis Mother   .  Hypertension Father   . Prostate cancer Father   . Heart attack Father   . Kidney cancer Sister     Review of Systems:  Constitutional  Feels  fatigued  ENT Normal appearing ears and nares bilaterally Skin/Breast  No rash, sores, jaundice, itching, dryness Cardiovascular  No chest pain, shortness of breath, or edema  Pulmonary  No cough or wheeze.  Gastro Intestinal  No nausea, vomitting, or diarrhoea. No bright red blood per rectum, no abdominal pain, change in bowel movement, or constipation.  Genito Urinary  No frequency, urgency, dysuria, + heavy vaginal bleeding Musculo Skeletal  No myalgia, arthralgia, joint swelling or pain  Neurologic  + headaches Psychology  No depression, anxiety, insomnia.   Vitals:  Blood pressure 134/79, pulse 70, temperature 98.2 F (36.8 C), resp. rate 18, height 5\' 2"  (1.575  m), weight 145 lb 12.8 oz (66.1 kg), SpO2 100 %.  Physical Exam: WD in NAD Neck  Supple NROM, without any enlargements.  Lymph Node Survey No cervical supraclavicular or inguinal adenopathy Cardiovascular  Pulse normal rate, regularity and rhythm. S1 and S2 normal.  Lungs  Clear to auscultation bilateraly, without wheezes/crackles/rhonchi. Good air movement.  Skin  No rash/lesions/breakdown  Psychiatry  Alert and oriented to person, place, and time  Abdomen  Normoactive bowel sounds, abdomen soft, non-tender and slightly overweight/obese without evidence of hernia. There is a palpable mass in the inferior aspect of her abdomen (below umbilicus, consistent with a 12 week size uterus). Back No CVA tenderness Genito Urinary  Vulva/vagina: Normal external female genitalia.  No lesions. No discharge or bleeding.  Bladder/urethra:  No lesions or masses, well supported bladder  Vagina: normal  Cervix: Normal appearing, no lesions.  Uterus: 12 week size, mobile, anterior left fibroid appreciated.   Adnexa: no discrete masses other than fibroids. Rectal  deferred   Extremities  No bilateral cyanosis, clubbing or edema.  Donaciano Eva, MD  06/03/2017, 5:13 PM

## 2017-06-11 NOTE — Transfer of Care (Signed)
Immediate Anesthesia Transfer of Care Note  Patient: Stephanie Hancock  Procedure(s) Performed: Procedure(s): XI ROBOTIC ASSISTED TOTAL HYSTERECTOMY BILATERAL SALPINGECTOMY, RIGHT OOPHERECTOMY,  LYSIS OF ADHESIONS ,MINI LAPAROTOMY (Bilateral)  Patient Location: PACU  Anesthesia Type:General  Level of Consciousness: awake, alert  and oriented  Airway & Oxygen Therapy: Patient Spontanous Breathing and Patient connected to face mask oxygen  Post-op Assessment: Report given to RN and Post -op Vital signs reviewed and stable  Post vital signs: Reviewed and stable  Last Vitals:  Vitals:   06/11/17 0550  BP: 131/79  Pulse: 83  Resp: 18  Temp: 36.7 C  SpO2: 99%    Last Pain:  Vitals:   06/11/17 0607  TempSrc:   PainSc: 3       Patients Stated Pain Goal: 3 (02/15/01 1117)  Complications: No apparent anesthesia complications

## 2017-06-12 ENCOUNTER — Ambulatory Visit: Payer: BLUE CROSS/BLUE SHIELD | Admitting: Gynecologic Oncology

## 2017-06-12 ENCOUNTER — Telehealth: Payer: Self-pay | Admitting: *Deleted

## 2017-06-12 DIAGNOSIS — Z91040 Latex allergy status: Secondary | ICD-10-CM | POA: Diagnosis not present

## 2017-06-12 DIAGNOSIS — D251 Intramural leiomyoma of uterus: Secondary | ICD-10-CM | POA: Diagnosis not present

## 2017-06-12 DIAGNOSIS — Z7982 Long term (current) use of aspirin: Secondary | ICD-10-CM | POA: Diagnosis not present

## 2017-06-12 DIAGNOSIS — G43909 Migraine, unspecified, not intractable, without status migrainosus: Secondary | ICD-10-CM | POA: Diagnosis not present

## 2017-06-12 DIAGNOSIS — Q625 Duplication of ureter: Secondary | ICD-10-CM | POA: Diagnosis not present

## 2017-06-12 DIAGNOSIS — Z79899 Other long term (current) drug therapy: Secondary | ICD-10-CM | POA: Diagnosis not present

## 2017-06-12 DIAGNOSIS — F329 Major depressive disorder, single episode, unspecified: Secondary | ICD-10-CM | POA: Diagnosis not present

## 2017-06-12 DIAGNOSIS — Z888 Allergy status to other drugs, medicaments and biological substances status: Secondary | ICD-10-CM | POA: Diagnosis not present

## 2017-06-12 LAB — CBC
HCT: 37 % (ref 36.0–46.0)
HEMOGLOBIN: 12.4 g/dL (ref 12.0–15.0)
MCH: 29.8 pg (ref 26.0–34.0)
MCHC: 33.5 g/dL (ref 30.0–36.0)
MCV: 88.9 fL (ref 78.0–100.0)
PLATELETS: 249 10*3/uL (ref 150–400)
RBC: 4.16 MIL/uL (ref 3.87–5.11)
RDW: 13.2 % (ref 11.5–15.5)
WBC: 8.4 10*3/uL (ref 4.0–10.5)

## 2017-06-12 LAB — BASIC METABOLIC PANEL
ANION GAP: 7 (ref 5–15)
BUN: 8 mg/dL (ref 6–20)
CO2: 24 mmol/L (ref 22–32)
Calcium: 9.4 mg/dL (ref 8.9–10.3)
Chloride: 107 mmol/L (ref 101–111)
Creatinine, Ser: 0.72 mg/dL (ref 0.44–1.00)
GFR calc Af Amer: >60 mL/min (ref >60)
GLUCOSE: 125 mg/dL — AB (ref 65–99)
POTASSIUM: 4.6 mmol/L (ref 3.5–5.1)
SODIUM: 138 mmol/L (ref 135–145)

## 2017-06-12 MED ORDER — HYDROMORPHONE HCL 2 MG PO TABS
2.0000 mg | ORAL_TABLET | ORAL | Status: DC | PRN
  Administered 2017-06-12: 2 mg via ORAL
  Filled 2017-06-12: qty 1

## 2017-06-12 MED ORDER — HYDROMORPHONE HCL 2 MG PO TABS: 2.0000 mg | ORAL_TABLET | ORAL | 0 refills | Status: DC | PRN

## 2017-06-12 NOTE — Discharge Summary (Signed)
Physician Discharge Summary  Patient ID: Stephanie Hancock MRN: [id number] DOB/AGE: Jan 15, 1966 51 y.o.  Admit date: 06/11/2017 Discharge date: 06/12/2017  Admission Diagnoses: Leiomyoma, intramural  Discharge Diagnoses:  Principal Problem:   Leiomyoma, intramural Active Problems:   Uterine fibroid   Discharged Condition:  The patient is in good condition and stable for discharge.    Hospital Course: On 06/11/2017, the patient underwent the following: Procedure(s): XI ROBOTIC ASSISTED TOTAL HYSTERECTOMY BILATERAL SALPINGECTOMY, RIGHT OOPHERECTOMY,  LYSIS OF ADHESIONS ,MINI LAPAROTOMY.  The postoperative course was uneventful.  She was discharged to home on postoperative day 1 tolerating a regular diet, ambulating, voiding, passing flatus, pain controlled with oral medication.  Consults: None  Significant Diagnostic Studies: None  Treatments: surgery: see above  Discharge Exam: Blood pressure 108/75, pulse 71, temperature 98 F (36.7 C), temperature source Oral, resp. rate 16, height 5\' 2"  (1.575 m), weight 145 lb (65.8 kg), SpO2 100 %. General appearance: alert, cooperative and no distress Resp: clear to auscultation bilaterally Cardio: regular rate and rhythm, S1, S2 normal, no murmur, click, rub or gallop GI: soft, non-tender; bowel sounds normal; no masses,  no organomegaly Extremities: extremities normal, atraumatic, no cyanosis or edema Incision/Wound: Lap sites to the abdomen with dermabond without erythema or drainage, mini-lap incision on the abdomen intact with dermabond without drainage  Disposition: 01-Home or Self Care  Discharge Instructions    Call MD for:  difficulty breathing, headache or visual disturbances    Complete by:  As directed    Call MD for:  extreme fatigue    Complete by:  As directed    Call MD for:  hives    Complete by:  As directed    Call MD for:  persistant dizziness or light-headedness    Complete by:  As directed    Call MD for:  persistant  nausea and vomiting    Complete by:  As directed    Call MD for:  redness, tenderness, or signs of infection (pain, swelling, redness, odor or green/yellow discharge around incision site)    Complete by:  As directed    Call MD for:  severe uncontrolled pain    Complete by:  As directed    Call MD for:  temperature >100.4    Complete by:  As directed    Diet - low sodium heart healthy    Complete by:  As directed    Driving Restrictions    Complete by:  As directed    No driving for 1 week.  Do not take narcotics and drive.   Increase activity slowly    Complete by:  As directed    Lifting restrictions    Complete by:  As directed    No lifting greater than 10 lbs.   Sexual Activity Restrictions    Complete by:  As directed    No sexual activity, nothing in the vagina, for 8 weeks.     Allergies as of 06/12/2017      Reactions   Codeine Hives   Kiwi Extract Swelling, Other (See Comments)   Tingling in throat/mouth swells.   Levetiracetam Other (See Comments)   HALLUCINATIONS W/SUICIDAL IDEATION   Other    NO BLOOD PRODUCTS   Powder Itching, Other (See Comments)   LATEX GLOVES WITH POWDER CAUSES ITCHING      Medication List    TAKE these medications   albuterol 108 (90 Base) MCG/ACT inhaler Commonly known as:  PROVENTIL HFA;VENTOLIN HFA Inhale 1-2 puffs into the  lungs every 6 (six) hours as needed for wheezing or shortness of breath.   albuterol (5 MG/ML) 0.5% nebulizer solution Commonly known as:  PROVENTIL Take 2.5 mg by nebulization every 6 (six) hours as needed for wheezing or shortness of breath.   aspirin-acetaminophen-caffeine 250-250-65 MG tablet Commonly known as:  EXCEDRIN MIGRAINE Take 3 tablets by mouth 3 (three) times daily as needed for headache.   Biotin 5000 MCG Caps Take 5,000 mcg by mouth every evening.   ferrous gluconate 225 (27 Fe) MG tablet Commonly known as:  FERGON Take 1 tablet (240 mg total) by mouth 3 (three) times daily with meals.    flurbiprofen 100 MG tablet Commonly known as:  ANSAID Take 100 mg by mouth See admin instructions. TAKE 1 TABLET (100 MG) BY MOUTH DAILY AS NEEDED FOR MIGRAINE HEADACHES (DO NOT EXCEED 4 TABLETS IN A WEEK)   fluticasone-salmeterol 45-21 MCG/ACT inhaler Commonly known as:  ADVAIR HFA Inhale 2 puffs into the lungs 2 (two) times daily.   HYDROmorphone 2 MG tablet Commonly known as:  DILAUDID Take 1 tablet (2 mg total) by mouth every 4 (four) hours as needed for severe pain.   senna 8.6 MG Tabs tablet Commonly known as:  SENOKOT Take 1 tablet (8.6 mg total) by mouth at bedtime. What changed:  when to take this  reasons to take this   SUMAtriptan 25 MG tablet Commonly known as:  IMITREX Take 25 mg by mouth every 2 (two) hours as needed for migraine (disintigating tablet). May repeat in 2 hours if headache persists or recurs.   zolmitriptan 5 MG disintegrating tablet Commonly known as:  ZOMIG-ZMT TK 1 T PO PRF MIGRAINE. MAY REPEAT IN 2 H PRN. NO MORE THAN 2 IN 24 H FOR 2 TO 3 DAYS            Discharge Care Instructions        Start     Ordered   06/12/17 0000  Increase activity slowly     06/12/17 0954   06/12/17 0000  Driving Restrictions    Comments:  No driving for 1 week.  Do not take narcotics and drive.   06/12/17 0954   06/12/17 0000  Lifting restrictions    Comments:  No lifting greater than 10 lbs.   06/12/17 0954   06/12/17 0000  Sexual Activity Restrictions    Comments:  No sexual activity, nothing in the vagina, for 8 weeks.   06/12/17 0954   06/12/17 0000  Diet - low sodium heart healthy     06/12/17 0954   06/12/17 0000  Call MD for:  temperature >100.4     06/12/17 0954   06/12/17 0000  Call MD for:  persistant nausea and vomiting     06/12/17 0954   06/12/17 0000  Call MD for:  severe uncontrolled pain     06/12/17 0954   06/12/17 0000  Call MD for:  redness, tenderness, or signs of infection (pain, swelling, redness, odor or green/yellow  discharge around incision site)     06/12/17 0954   06/12/17 0000  Call MD for:  difficulty breathing, headache or visual disturbances     06/12/17 0954   06/12/17 0000  Call MD for:  hives     06/12/17 0954   06/12/17 0000  Call MD for:  persistant dizziness or light-headedness     06/12/17 0954   06/12/17 0000  Call MD for:  extreme fatigue     06/12/17 0954  06/12/17 0000  HYDROmorphone (DILAUDID) 2 MG tablet  Every 4 hours PRN     06/12/17 1004       Greater than thirty minutes were spend for face to face discharge instructions and discharge orders/summary in EPIC.   Signed: CROSS, MELISSA DEAL 06/12/2017, 10:23 AM

## 2017-06-12 NOTE — Discharge Instructions (Signed)
06/12/2017  Return to work: 4-6 weeks if applicable  Activity: 1. Be up and out of the bed during the day.  Take a nap if needed.  You may walk up steps but be careful and use the hand rail.  Stair climbing will tire you more than you think, you may need to stop part way and rest.   2. No lifting or straining for 6 weeks.  3. No driving for 1 week(s).  Do not drive if you are taking narcotic pain medicine.  4. Shower daily.  Use soap and water on your incision and pat dry; don't rub.  No tub baths until cleared by your surgeon.   5. No sexual activity and nothing in the vagina for 8 weeks.  6. You may experience a small amount of clear drainage from your incisions, which is normal.  If the drainage persists or increases, please call the office.  7. You may experience vaginal spotting after surgery or around the 6-8 week mark from surgery when the stitches at the top of the vagina begin to dissolve.  The spotting is normal but if you experience heavy bleeding, call our office.  Diet: 1. Low sodium Heart Healthy Diet is recommended.  2. It is safe to use a laxative, such as Miralax or Colace, if you have difficulty moving your bowels.   Wound Care: 1. Keep clean and dry.  Shower daily.  Reasons to call the Doctor:  Fever - Oral temperature greater than 100.4 degrees Fahrenheit  Foul-smelling vaginal discharge  Difficulty urinating  Nausea and vomiting  Increased pain at the site of the incision that is unrelieved with pain medicine.  Difficulty breathing with or without chest pain  New calf pain especially if only on one side  Sudden, continuing increased vaginal bleeding with or without clots.   Contacts: For questions or concerns you should contact:  Dr. Everitt Amber at [phone]  Joylene John, NP at [phone]  After Hours: call [phone] and have the GYN Oncologist paged/contacted  Hydromorphone tablets What is this medicine? HYDROMORPHONE (hye droe MOR  fone) is a pain reliever. It is used to treat moderate to severe pain. This medicine may be used for other purposes; ask your health care provider or pharmacist if you have questions. COMMON BRAND NAME(S): Dilaudid What should I tell my health care provider before I take this medicine? They need to know if you have any of these conditions: -brain tumor -drug abuse or addiction -head injury -heart disease -if you often drink alcohol -kidney disease -liver disease -lung or breathing disease, like asthma -problems urinating -seizures -stomach or intestine problems -an unusual or allergic reaction to hydromorphone, other medicines, foods, dyes, or preservatives -pregnant or trying to get pregnant -breast-feeding How should I use this medicine? Take this medicine by mouth with a glass of water. Follow the directions on the prescription label. You can take this medicine with or without food. If it upsets your stomach, take it with food. Take your medicine at regular intervals. Do not take it more often than directed. Do not stop taking except on your doctor's advice. A special MedGuide will be given to you by the pharmacist with each prescription and refill. Be sure to read this information carefully each time. Talk to your pediatrician regarding the use of this medicine in children. Special care may be needed. Overdosage: If you think you have taken too much of this medicine contact a poison control center or emergency room at once. NOTE:  This medicine is only for you. Do not share this medicine with others. What if I miss a dose? If you miss a dose, take it as soon as you can. If it is almost time for your next dose, take only that dose. Do not take double or extra doses. What may interact with this medicine? This medicine may interact with the following medications: -alcohol -antihistamines for allergy, cough and cold -certain medicines for anxiety or sleep -certain medicines for  depression like amitriptyline, fluoxetine, sertraline -certain medicines for seizures like phenobarbital, primidone -general anesthetics like halothane, isoflurane, methoxyflurane, propofol -local anesthetics like lidocaine, pramoxine, tetracaine -MAOIs like Carbex, Eldepryl, Marplan, Nardil, and Parnate -medicines that relax muscles for surgery -other narcotic medicines for pain or cough -phenothiazines like chlorpromazine, mesoridazine, prochlorperazine, thioridazine This list may not describe all possible interactions. Give your health care provider a list of all the medicines, herbs, non-prescription drugs, or dietary supplements you use. Also tell them if you smoke, drink alcohol, or use illegal drugs. Some items may interact with your medicine. What should I watch for while using this medicine? Tell your doctor or health care professional if your pain does not go away, if it gets worse, or if you have new or a different type of pain. You may develop tolerance to the medicine. Tolerance means that you will need a higher dose of the medicine for pain relief. Tolerance is normal and is expected if you take this medicine for a long time. Do not suddenly stop taking your medicine because you may develop a severe reaction. Your body becomes used to the medicine. This does NOT mean you are addicted. Addiction is a behavior related to getting and using a drug for a non-medical reason. If you have pain, you have a medical reason to take pain medicine. Your doctor will tell you how much medicine to take. If your doctor wants you to stop the medicine, the dose will be slowly lowered over time to avoid any side effects. There are different types of narcotic medicines (opiates). If you take more than one type at the same time or if you are taking another medicine that also causes drowsiness, you may have more side effects. Give your health care provider a list of all medicines you use. Your doctor will tell you  how much medicine to take. Do not take more medicine than directed. Call emergency for help if you have problems breathing or unusual sleepiness. You may get drowsy or dizzy. Do not drive, use machinery, or do anything that needs mental alertness until you know how this medicine affects you. Do not stand or sit up quickly, especially if you are an older patient. This reduces the risk of dizzy or fainting spells. Alcohol may interfere with the effect of this medicine. Avoid alcoholic drinks. This medicine will cause constipation. Try to have a bowel movement at least every 2 to 3 days. If you do not have a bowel movement for 3 days, call your doctor or health care professional. Your mouth may get dry. Chewing sugarless gum or sucking hard candy, and drinking plenty of water may help. Contact your doctor if the problem does not go away or is severe. What side effects may I notice from receiving this medicine? Side effects that you should report to your doctor or health care professional as soon as possible: -allergic reactions like skin rash, itching or hives, swelling of the face, lips, or tongue -breathing problems -confusion -seizures -signs and symptoms of  low blood pressure like dizziness; feeling faint or lightheaded, falls; unusually weak or tired -trouble passing urine or change in the amount of urine Side effects that usually do not require medical attention (report to your doctor or health care professional if they continue or are bothersome): -constipation -dry mouth -nausea, vomiting -tiredness This list may not describe all possible side effects. Call your doctor for medical advice about side effects. You may report side effects to FDA at 1-800-FDA-1088. Where should I keep my medicine? Keep out of the reach of children. This medicine can be abused. Keep your medicine in a safe place to protect it from theft. Do not share this medicine with anyone. Selling or giving away this medicine  is dangerous and against the law. Store at room temperature between 15 and 30 degrees C (59 and 86 degrees F). Keep container tightly closed. Protect from light. This medicine may cause accidental overdose and death if it is taken by other adults, children, or pets. Flush any unused medicine down the toilet to reduce the chance of harm. Do not use the medicine after the expiration date. NOTE: This sheet is a summary. It may not cover all possible information. If you have questions about this medicine, talk to your doctor, pharmacist, or health care provider.  2018 [employer]vier/Gold Standard (2015-06-17 13:24:56)

## 2017-06-12 NOTE — Progress Notes (Signed)
D/C "Dhome with   Family ,Verbalized understanding of all instructions

## 2017-06-12 NOTE — Telephone Encounter (Signed)
Contacted the patient and gave the post op appt for October 1st at 12pm

## 2017-06-20 ENCOUNTER — Inpatient Hospital Stay (HOSPITAL_COMMUNITY)
Admission: EM | Admit: 2017-06-20 | Discharge: 2017-06-23 | DRG: 394 | Disposition: A | Discharge status: Home / Self Care | Payer: BLUE CROSS/BLUE SHIELD | Attending: Obstetrics & Gynecology | Admitting: Obstetrics & Gynecology

## 2017-06-20 ENCOUNTER — Encounter (HOSPITAL_COMMUNITY): Payer: Self-pay

## 2017-06-20 DIAGNOSIS — Z90710 Acquired absence of both cervix and uterus: Secondary | ICD-10-CM | POA: Diagnosis not present

## 2017-06-20 DIAGNOSIS — F329 Major depressive disorder, single episode, unspecified: Secondary | ICD-10-CM | POA: Diagnosis present

## 2017-06-20 DIAGNOSIS — Z885 Allergy status to narcotic agent status: Secondary | ICD-10-CM | POA: Diagnosis not present

## 2017-06-20 DIAGNOSIS — D649 Anemia, unspecified: Secondary | ICD-10-CM | POA: Diagnosis not present

## 2017-06-20 DIAGNOSIS — J45909 Unspecified asthma, uncomplicated: Secondary | ICD-10-CM | POA: Diagnosis present

## 2017-06-20 DIAGNOSIS — Z8262 Family history of osteoporosis: Secondary | ICD-10-CM | POA: Diagnosis not present

## 2017-06-20 DIAGNOSIS — Z8042 Family history of malignant neoplasm of prostate: Secondary | ICD-10-CM | POA: Diagnosis not present

## 2017-06-20 DIAGNOSIS — I8290 Acute embolism and thrombosis of unspecified vein: Secondary | ICD-10-CM | POA: Diagnosis not present

## 2017-06-20 DIAGNOSIS — Z91018 Allergy to other foods: Secondary | ICD-10-CM | POA: Diagnosis not present

## 2017-06-20 DIAGNOSIS — Z8249 Family history of ischemic heart disease and other diseases of the circulatory system: Secondary | ICD-10-CM | POA: Diagnosis not present

## 2017-06-20 DIAGNOSIS — Z888 Allergy status to other drugs, medicaments and biological substances status: Secondary | ICD-10-CM | POA: Diagnosis not present

## 2017-06-20 DIAGNOSIS — Z8051 Family history of malignant neoplasm of kidney: Secondary | ICD-10-CM

## 2017-06-20 DIAGNOSIS — I82890 Acute embolism and thrombosis of other specified veins: Secondary | ICD-10-CM

## 2017-06-20 DIAGNOSIS — Z833 Family history of diabetes mellitus: Secondary | ICD-10-CM | POA: Diagnosis not present

## 2017-06-20 DIAGNOSIS — K567 Ileus, unspecified: Secondary | ICD-10-CM | POA: Diagnosis present

## 2017-06-20 DIAGNOSIS — R109 Unspecified abdominal pain: Secondary | ICD-10-CM | POA: Diagnosis not present

## 2017-06-20 DIAGNOSIS — K9189 Other postprocedural complications and disorders of digestive system: Secondary | ICD-10-CM | POA: Diagnosis not present

## 2017-06-20 DIAGNOSIS — Z7951 Long term (current) use of inhaled steroids: Secondary | ICD-10-CM

## 2017-06-20 DIAGNOSIS — R112 Nausea with vomiting, unspecified: Secondary | ICD-10-CM | POA: Diagnosis not present

## 2017-06-20 DIAGNOSIS — R1110 Vomiting, unspecified: Secondary | ICD-10-CM | POA: Diagnosis not present

## 2017-06-20 NOTE — ED Triage Notes (Signed)
Pt had a hysterectomy last Tuesday, was constipated at first, took a laxative had a slight bowel movement, then she became nauseated and having some vomiting and abdominal pain

## 2017-06-21 ENCOUNTER — Encounter (HOSPITAL_COMMUNITY): Payer: Self-pay

## 2017-06-21 ENCOUNTER — Emergency Department (HOSPITAL_COMMUNITY): Payer: BLUE CROSS/BLUE SHIELD

## 2017-06-21 DIAGNOSIS — K9189 Other postprocedural complications and disorders of digestive system: Secondary | ICD-10-CM

## 2017-06-21 DIAGNOSIS — R112 Nausea with vomiting, unspecified: Secondary | ICD-10-CM | POA: Diagnosis not present

## 2017-06-21 DIAGNOSIS — R1110 Vomiting, unspecified: Secondary | ICD-10-CM | POA: Diagnosis not present

## 2017-06-21 DIAGNOSIS — R109 Unspecified abdominal pain: Secondary | ICD-10-CM | POA: Diagnosis not present

## 2017-06-21 DIAGNOSIS — K567 Ileus, unspecified: Secondary | ICD-10-CM | POA: Diagnosis present

## 2017-06-21 LAB — URINALYSIS, ROUTINE W REFLEX MICROSCOPIC
Bilirubin Urine: NEGATIVE
GLUCOSE, UA: NEGATIVE mg/dL
KETONES UR: 5 mg/dL — AB
Nitrite: NEGATIVE
PROTEIN: NEGATIVE mg/dL
Specific Gravity, Urine: 1.01 (ref 1.005–1.030)
pH: 5 (ref 5.0–8.0)

## 2017-06-21 LAB — CBC WITH DIFFERENTIAL/PLATELET
BASOS ABS: 0 10*3/uL (ref 0.0–0.1)
BASOS PCT: 0 %
BASOS PCT: 0 %
Basophils Absolute: 0 10*3/uL (ref 0.0–0.1)
EOS ABS: 0.1 10*3/uL (ref 0.0–0.7)
EOS ABS: 0.2 10*3/uL (ref 0.0–0.7)
Eosinophils Relative: 1 %
Eosinophils Relative: 3 %
HEMATOCRIT: 44.2 % (ref 36.0–46.0)
HEMATOCRIT: 32.6 % — AB (ref 36.0–46.0)
HEMOGLOBIN: 15.6 g/dL — AB (ref 12.0–15.0)
HEMOGLOBIN: 11.4 g/dL — AB (ref 12.0–15.0)
LYMPHS ABS: 1.6 10*3/uL (ref 0.7–4.0)
Lymphocytes Relative: 12 %
Lymphocytes Relative: 22 %
Lymphs Abs: 1.5 10*3/uL (ref 0.7–4.0)
MCH: 30.6 pg (ref 26.0–34.0)
MCH: 30.6 pg (ref 26.0–34.0)
MCHC: 35.3 g/dL (ref 30.0–36.0)
MCHC: 35 g/dL (ref 30.0–36.0)
MCV: 86.7 fL (ref 78.0–100.0)
MCV: 87.6 fL (ref 78.0–100.0)
MONO ABS: 0.5 10*3/uL (ref 0.1–1.0)
MONOS PCT: 3 %
MONOS PCT: 5 %
Monocytes Absolute: 0.4 10*3/uL (ref 0.1–1.0)
NEUTROS ABS: 11.5 10*3/uL — AB (ref 1.7–7.7)
NEUTROS ABS: 5 10*3/uL (ref 1.7–7.7)
NEUTROS PCT: 70 %
Neutrophils Relative %: 84 %
Platelets: 349 10*3/uL (ref 150–400)
Platelets: 257 10*3/uL (ref 150–400)
RBC: 5.1 MIL/uL (ref 3.87–5.11)
RBC: 3.72 MIL/uL — ABNORMAL LOW (ref 3.87–5.11)
RDW: 12.5 % (ref 11.5–15.5)
RDW: 12.7 % (ref 11.5–15.5)
WBC: 13.7 10*3/uL — ABNORMAL HIGH (ref 4.0–10.5)
WBC: 7.1 10*3/uL (ref 4.0–10.5)

## 2017-06-21 LAB — COMPREHENSIVE METABOLIC PANEL
ALBUMIN: 5.5 g/dL — AB (ref 3.5–5.0)
ALK PHOS: 95 U/L (ref 38–126)
ALT: 51 U/L (ref 14–54)
ANION GAP: 16 — AB (ref 5–15)
AST: 26 U/L (ref 15–41)
BILIRUBIN TOTAL: 0.6 mg/dL (ref 0.3–1.2)
BUN: 13 mg/dL (ref 6–20)
CALCIUM: 11 mg/dL — AB (ref 8.9–10.3)
CO2: 20 mmol/L — AB (ref 22–32)
CREATININE: 0.79 mg/dL (ref 0.44–1.00)
Chloride: 105 mmol/L (ref 101–111)
GLUCOSE: 157 mg/dL — AB (ref 65–99)
POTASSIUM: 3.6 mmol/L (ref 3.5–5.1)
Sodium: 141 mmol/L (ref 135–145)
Total Protein: 9.5 g/dL — ABNORMAL HIGH (ref 6.5–8.1)

## 2017-06-21 LAB — LIPASE, BLOOD: Lipase: 24 U/L (ref 11–51)

## 2017-06-21 LAB — BASIC METABOLIC PANEL
ANION GAP: 6 (ref 5–15)
BUN: 9 mg/dL (ref 6–20)
CALCIUM: 9.1 mg/dL (ref 8.9–10.3)
CHLORIDE: 113 mmol/L — AB (ref 101–111)
CO2: 22 mmol/L (ref 22–32)
CREATININE: 0.73 mg/dL (ref 0.44–1.00)
GFR calc non Af Amer: >60 mL/min (ref >60)
Glucose, Bld: 137 mg/dL — ABNORMAL HIGH (ref 65–99)
Potassium: 4.1 mmol/L (ref 3.5–5.1)
SODIUM: 141 mmol/L (ref 135–145)

## 2017-06-21 LAB — I-STAT CG4 LACTIC ACID, ED: LACTIC ACID, VENOUS: 1.69 mmol/L (ref 0.5–1.9)

## 2017-06-21 MED ORDER — DIPHENHYDRAMINE HCL 50 MG/ML IJ SOLN
12.5000 mg | Freq: Four times a day (QID) | INTRAMUSCULAR | Status: DC | PRN
  Administered 2017-06-21: 12.5 mg via INTRAVENOUS
  Filled 2017-06-21: qty 1

## 2017-06-21 MED ORDER — SUMATRIPTAN SUCCINATE 25 MG PO TABS
25.0000 mg | ORAL_TABLET | ORAL | Status: DC | PRN
Start: 2017-06-21 — End: 2017-06-23
  Administered 2017-06-21 – 2017-06-22 (×2): 25 mg via ORAL
  Filled 2017-06-21 (×4): qty 1

## 2017-06-21 MED ORDER — ALBUTEROL SULFATE (2.5 MG/3ML) 0.083% IN NEBU: 3.0000 mL | INHALATION_SOLUTION | Freq: Four times a day (QID) | RESPIRATORY_TRACT | Status: DC | PRN

## 2017-06-21 MED ORDER — IOPAMIDOL (ISOVUE-300) INJECTION 61%
INTRAVENOUS | Status: AC
  Filled 2017-06-21: qty 100

## 2017-06-21 MED ORDER — NON FORMULARY: 2.0000 | Freq: Two times a day (BID) | Status: DC

## 2017-06-21 MED ORDER — SODIUM CHLORIDE 0.9 % IV BOLUS (SEPSIS)
1000.0000 mL | Freq: Once | INTRAVENOUS | Status: AC
  Administered 2017-06-21: 1000 mL via INTRAVENOUS

## 2017-06-21 MED ORDER — KCL-LACTATED RINGERS-D5W 20 MEQ/L IV SOLN
INTRAVENOUS | Status: DC
  Administered 2017-06-21 – 2017-06-22 (×3): via INTRAVENOUS
  Administered 2017-06-22: 1000 mL via INTRAVENOUS
  Administered 2017-06-23: 05:00:00 via INTRAVENOUS
  Filled 2017-06-21 (×5): qty 1000

## 2017-06-21 MED ORDER — KETOROLAC TROMETHAMINE 15 MG/ML IJ SOLN
15.0000 mg | Freq: Four times a day (QID) | INTRAMUSCULAR | Status: AC
  Administered 2017-06-21 – 2017-06-22 (×4): 15 mg via INTRAVENOUS
  Filled 2017-06-21 (×4): qty 1

## 2017-06-21 MED ORDER — MORPHINE SULFATE (PF) 4 MG/ML IV SOLN: 4.0000 mg | INTRAVENOUS | Status: DC | PRN

## 2017-06-21 MED ORDER — MOMETASONE FURO-FORMOTEROL FUM 200-5 MCG/ACT IN AERO
2.0000 | INHALATION_SPRAY | Freq: Two times a day (BID) | RESPIRATORY_TRACT | Status: DC
  Administered 2017-06-21 – 2017-06-23 (×4): 2 via RESPIRATORY_TRACT
  Filled 2017-06-21: qty 8.8

## 2017-06-21 MED ORDER — MORPHINE SULFATE (PF) 4 MG/ML IV SOLN
4.0000 mg | Freq: Once | INTRAVENOUS | Status: AC
  Administered 2017-06-21: 4 mg via INTRAVENOUS
  Filled 2017-06-21: qty 1

## 2017-06-21 MED ORDER — PIPERACILLIN-TAZOBACTAM 3.375 G IVPB
3.3750 g | Freq: Three times a day (TID) | INTRAVENOUS | Status: DC
  Administered 2017-06-21 – 2017-06-23 (×5): 3.375 g via INTRAVENOUS
  Filled 2017-06-21 (×7): qty 50

## 2017-06-21 MED ORDER — IOPAMIDOL (ISOVUE-300) INJECTION 61%
100.0000 mL | Freq: Once | INTRAVENOUS | Status: AC | PRN
  Administered 2017-06-21: 100 mL via INTRAVENOUS

## 2017-06-21 MED ORDER — ONDANSETRON HCL 4 MG/2ML IJ SOLN: 4.0000 mg | Freq: Three times a day (TID) | INTRAMUSCULAR | Status: DC | PRN

## 2017-06-21 MED ORDER — MORPHINE SULFATE (PF) 2 MG/ML IV SOLN
4.0000 mg | INTRAVENOUS | Status: DC | PRN
  Administered 2017-06-21: 4 mg via INTRAVENOUS
  Filled 2017-06-21: qty 2

## 2017-06-21 MED ORDER — DIPHENHYDRAMINE HCL 50 MG/ML IJ SOLN
12.5000 mg | Freq: Once | INTRAMUSCULAR | Status: AC
  Administered 2017-06-21: 12.5 mg via INTRAVENOUS
  Filled 2017-06-21: qty 1

## 2017-06-21 MED ORDER — ONDANSETRON HCL 4 MG/2ML IJ SOLN
4.0000 mg | Freq: Once | INTRAMUSCULAR | Status: AC
  Administered 2017-06-21: 4 mg via INTRAVENOUS
  Filled 2017-06-21: qty 2

## 2017-06-21 MED ORDER — ALBUTEROL SULFATE (5 MG/ML) 0.5% IN NEBU: 2.5000 mg | INHALATION_SOLUTION | Freq: Four times a day (QID) | RESPIRATORY_TRACT | Status: DC | PRN

## 2017-06-21 MED ORDER — PIPERACILLIN-TAZOBACTAM 4.5 G IVPB: 4.5000 g | Freq: Three times a day (TID) | INTRAVENOUS | Status: DC

## 2017-06-21 MED ORDER — SUMATRIPTAN SUCCINATE 25 MG PO TABS
25.0000 mg | ORAL_TABLET | ORAL | Status: DC | PRN
  Filled 2017-06-21 (×4): qty 1

## 2017-06-21 NOTE — ED Provider Notes (Signed)
[facility] DEPT Provider Note   CSN: [id number] Arrival date & time: 06/20/17  2249     History   Chief Complaint Chief Complaint  Patient presents with  . Abdominal Pain    HPI Stephanie Hancock is a 51 y.o. female.  HPI 51 year old African-American female past medical history significant for anemia, asthma, depression that presents to the emergency Department today with complaints of abdominal pain, nausea, emesis. Patient states that she had a robotic-assisted hysterectomy performed on 9/5 by her OB/GYN doctor. The patient states that following the surgery she was constipated at first. States that she takes laxatives which helped with her bowel movements. The patient states that earlier this evening she became very nauseated and had several episodes of emesis. She also reports generalized abdominal pain. Patient has not taken anything at home for her pain prior to arrival. Moving makes the pain worse. Nothing makes the pain better. States that she should have a bowel movement prior to coming to the ED that was solid but denies any melena or hematochezia. Patient denies any urinary symptoms, vaginal bleeding, vaginal discharge.patient denies any blood in her emesis. Denies any associated fevers.  Pt denies any fever, chill, ha, vision changes, lightheadedness, dizziness, congestion, neck pain, cp, sob, cough, urinary symptoms,  melena, hematochezia, lower extremity paresthesias.  Past Medical History:  Diagnosis Date  . Abnormal uterine bleeding   . Anemia   . Asthma   . Depression    hx of  . Fibroid   . Migraine    with aura  . Patient is [employer] Witness     Patient Active Problem List   Diagnosis Date Noted  . Ileus ([place]) 06/21/2017  . Uterine fibroid 06/11/2017  . Iron deficiency anemia due to chronic blood loss 04/08/2017  . 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 Fibroids 03/31/2017    Past Surgical History:  Procedure Laterality Date  . ROBOTIC ASSISTED TOTAL  HYSTERECTOMY Bilateral 06/11/2017   Procedure: XI ROBOTIC ASSISTED TOTAL HYSTERECTOMY BILATERAL SALPINGECTOMY, RIGHT OOPHERECTOMY,  LYSIS OF ADHESIONS ,MINI LAPAROTOMY;  Surgeon: Everitt Amber, MD;  Location: [facility];  Service: Gynecology;  Laterality: Bilateral;  . TUBAL LIGATION     then reversal  . UTERINE FIBROID SURGERY      OB History    Gravida Para Term Preterm AB Living   3 2 0 0 1 2   SAB TAB Ectopic Multiple Live Births   1 0 0 0 0       Home Medications    Prior to Admission medications   Medication Sig Start Date End Date Taking? Authorizing Provider  albuterol (PROVENTIL HFA;VENTOLIN HFA) 108 (90 BASE) MCG/ACT inhaler Inhale 1-2 puffs into the lungs every 6 (six) hours as needed for wheezing or shortness of breath.   Yes [provider]  albuterol (PROVENTIL) (5 MG/ML) 0.5% nebulizer solution Take 2.5 mg by nebulization every 6 (six) hours as needed for wheezing or shortness of breath.   Yes [provider]  aspirin-acetaminophen-caffeine (EXCEDRIN MIGRAINE) [phone] MG tablet Take 3 tablets by mouth 3 (three) times daily as needed for headache.    Yes [provider]  flurbiprofen (ANSAID) 100 MG tablet Take 100 mg by mouth See admin instructions. TAKE 1 TABLET (100 MG) BY MOUTH DAILY AS NEEDED FOR MIGRAINE HEADACHES (DO NOT EXCEED 4 TABLETS IN A WEEK)   Yes [provider]  fluticasone-salmeterol (ADVAIR HFA) 45-21 MCG/ACT inhaler Inhale 2 puffs into the lungs 2 (two) times daily.   Yes [provider]  ibuprofen (ADVIL,MOTRIN) 200 MG tablet Take 400 mg by mouth every 6 (six) hours as needed for moderate pain.   Yes [provider]  SUMAtriptan (IMITREX) 25 MG tablet Take 25 mg by mouth every 2 (two) hours as needed for migraine (disintigating tablet). May repeat in 2 hours if headache persists or recurs.   Yes [provider]  zolmitriptan (ZOMIG-ZMT) 5 MG disintegrating tablet TK 1 T PO PRF MIGRAINE. MAY REPEAT  IN 2 H PRN. NO MORE THAN 2 IN 24 H FOR 2 TO 3 DAYS 12/20/16  Yes [provider]  ferrous gluconate (FERGON) 225 (27 Fe) MG tablet Take 1 tablet (240 mg total) by mouth 3 (three) times daily with meals. Patient not taking: Reported on 06/21/2017 04/08/17   Everitt Amber, MD  HYDROmorphone (DILAUDID) 2 MG tablet Take 1 tablet (2 mg total) by mouth every 4 (four) hours as needed for severe pain. Patient not taking: Reported on 06/21/2017 06/12/17   Joylene John D, NP  senna (SENOKOT) 8.6 MG TABS tablet Take 1 tablet (8.6 mg total) by mouth at bedtime. Patient not taking: Reported on 06/21/2017 04/08/17   Everitt Amber, MD    Family History Family History  Problem Relation Age of Onset  . Hypertension Mother   . Diabetes Mother   . Osteoporosis Mother   . Hypertension Father   . Prostate cancer Father   . Heart attack Father   . Kidney cancer Sister     Social History Social History  Substance Use Topics  . Smoking status: Never Smoker  . Smokeless tobacco: Never Used  . Alcohol use 1.2 - 2.4 oz/week    2 - 4 Cans of beer per week     Comment: 1-2 beers per day     Allergies   Codeine; Kiwi extract; Levetiracetam; Other; and Powder   Review of Systems Review of Systems  Constitutional: Negative for chills and fever.  HENT: Negative for congestion.   Eyes: Negative for visual disturbance.  Respiratory: Negative for cough and shortness of breath.   Cardiovascular: Negative for chest pain.  Gastrointestinal: Positive for abdominal pain, constipation, diarrhea, nausea and vomiting. Negative for blood in stool.  Genitourinary: Negative for dysuria, flank pain, frequency, hematuria, urgency, vaginal bleeding and vaginal discharge.  Musculoskeletal: Negative for arthralgias and myalgias.  Skin: Negative for rash.  Neurological: Negative for dizziness, syncope, weakness, light-headedness, numbness and headaches.  Psychiatric/Behavioral: Negative for sleep disturbance. The patient is  not nervous/anxious.      Physical Exam Updated Vital Signs BP 138/84 (BP Location: Left Arm)   Pulse 94   Temp 97.7 F (36.5 C) (Oral)   Resp 18   Ht 5\' 2"  (1.575 m)   Wt 63.5 kg (140 lb)   LMP 03/29/2017   SpO2 100%   BMI 25.61 kg/m   Physical Exam  Constitutional: She is oriented to person, place, and time. She appears well-developed and well-nourished.  Non-toxic appearance. She appears distressed (due to paion).  HENT:  Head: Normocephalic and atraumatic.  Nose: Nose normal.  Mouth/Throat: Oropharynx is clear and moist.  Eyes: Pupils are equal, round, and reactive to light. Conjunctivae are normal. Right eye exhibits no discharge. Left eye exhibits no discharge.  Neck: Normal range of motion. Neck supple.  Cardiovascular: Normal rate, regular rhythm, normal heart sounds and intact distal pulses.  Exam reveals no gallop and no friction rub.   No murmur heard. Pulmonary/Chest: Effort normal and breath sounds normal. No respiratory distress.  She has no wheezes. She has no rales. She exhibits no tenderness.  Abdominal: Soft. Bowel sounds are normal. There is generalized tenderness. There is guarding. There is no rigidity, no rebound, no CVA tenderness, no tenderness at McBurney's point and negative Murphy's sign.  2 surgical incisions approximate 1 cm in length better without signs of overlying cellulitis and are healing appropriately. No purulent drainage. Mild ecchymosis noted over the periumbilical area.  Musculoskeletal: Normal range of motion. She exhibits no tenderness.  Lymphadenopathy:    She has no cervical adenopathy.  Neurological: She is alert and oriented to person, place, and time.  Skin: Skin is warm and dry. Capillary refill takes less than 2 seconds.  Psychiatric: Her behavior is normal. Judgment and thought content normal.  Nursing note and vitals reviewed.    ED Treatments / Results  Labs (all labs ordered are listed, but only abnormal results are  displayed) Labs Reviewed  CBC WITH DIFFERENTIAL/PLATELET - Abnormal; Notable for the following:       Result Value   WBC 13.7 (*)    Hemoglobin 15.6 (*)    Neutro Abs 11.5 (*)    All other components within normal limits  COMPREHENSIVE METABOLIC PANEL - Abnormal; Notable for the following:    CO2 20 (*)    Glucose, Bld 157 (*)    Calcium 11.0 (*)    Total Protein 9.5 (*)    Albumin 5.5 (*)    Anion gap 16 (*)    All other components within normal limits  URINALYSIS, ROUTINE W REFLEX MICROSCOPIC - Abnormal; Notable for the following:    APPearance HAZY (*)    Hgb urine dipstick SMALL (*)    Ketones, ur 5 (*)    Leukocytes, UA TRACE (*)    Bacteria, UA RARE (*)    Squamous Epithelial / LPF 6-30 (*)    All other components within normal limits  LIPASE, BLOOD  I-STAT CG4 LACTIC ACID, ED    EKG  EKG Interpretation None       Radiology Ct Abdomen Pelvis W Contrast  Result Date: 06/21/2017 CLINICAL DATA:  Abdominal pain. Nausea and vomiting. Constipation. Hysterectomy 10 days prior. EXAM: CT ABDOMEN AND PELVIS WITH CONTRAST TECHNIQUE: Multidetector CT imaging of the abdomen and pelvis was performed using the standard protocol following bolus administration of intravenous contrast. CONTRAST:  158mL ISOVUE-300 IOPAMIDOL (ISOVUE-300) INJECTION 61% COMPARISON:  None. FINDINGS: Lower chest: The lung bases are clear. No pleural fluid. No consolidation. Hepatobiliary: No focal liver abnormality is seen. No gallstones, gallbladder wall thickening, or biliary dilatation. Pancreas: No ductal dilatation or inflammation. Spleen: Normal in size without focal abnormality. Adrenals/Urinary Tract: Adrenal glands are unremarkable. Tiny right renal cysts. Kidneys are otherwise normal, without renal calculi, suspicious focal lesion, or hydronephrosis. Bladder is unremarkable. Small amount of air in the urinary bladder likely related to recent catheterization. Stomach/Bowel: Stomach is decompressed. Small  bowel is diffusely fluid-filled and prominent. Small bowel measures up to 2.7 cm in caliber. No transition point. Liquid stool/fluid in the ascending and proximal transverse colon, with formed stool distally. There is sigmoid colonic redundancy. Vascular/Lymphatic: There is a filling defect within the left gonadal vein (image 30 series 2). No extension into the left renal vein. No surrounding inflammation. Normal caliber abdominal aorta. No adenopathy. Reproductive: Post recent hysterectomy with postsurgical stranding in the pelvis. Small amount of free fluid without well organized abscess. Other: No free air. Edema in the abdominal wall at port sites. No subcutaneous fluid collection. Musculoskeletal:  There are no acute or suspicious osseous abnormalities. Transitional lumbosacral anatomy. IMPRESSION: 1. Diffusely fluid-filled small bowel and proximal colon, suggestive of postoperative ileus. No transition point to suggest obstruction. 2. No evidence of abscess post recent hysterectomy. Expected postsurgical stranding in the pelvis. 3. 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 No extension into the renal vein or adjacent inflammation to suggest thrombophlebitis. These results were called by telephone at the time of interpretation on 06/21/2017 at 4:48 am to [place] in the Emergency Department, who verbally acknowledged these results. Electronically Signed   By: Jeb Levering M.D.   On: 06/21/2017 04:49    Procedures Procedures (including critical care time)  Medications Ordered in ED Medications  iopamidol (ISOVUE-300) 61 % injection (not administered)  morphine 4 MG/ML injection 4 mg (not administered)  morphine 4 MG/ML injection 4 mg (4 mg Intravenous Given 06/21/17 0206)  ondansetron (ZOFRAN) injection 4 mg (4 mg Intravenous Given 06/21/17 0206)  diphenhydrAMINE (BENADRYL) injection 12.5 mg (12.5 mg Intravenous Given 06/21/17 0206)  sodium chloride 0.9 % bolus 1,000 mL (0 mLs Intravenous Stopped 06/21/17  0405)  morphine 4 MG/ML injection 4 mg (4 mg Intravenous Given 06/21/17 0443)  iopamidol (ISOVUE-300) 61 % injection 100 mL (100 mLs Intravenous Contrast Given 06/21/17 0357)  sodium chloride 0.9 % bolus 1,000 mL (1,000 mLs Intravenous New Bag/Given 06/21/17 0503)     Initial Impression / Assessment and Plan / ED Course  I have reviewed the triage vital signs and the nursing notes.  Pertinent labs & imaging results that were available during my care of the patient were reviewed by me and considered in my medical decision making (see chart for details).     Patient presents to the emergency Department today with complaints of abdominal pain, nausea, emesis, change in bowel habits following a hysterectomy on 9/5. Patient does report fluctuance and bowel movement prior to arrival. Denies any associated fever, melena, hematochezia, vaginal bleeding, vaginal discharge  Patient does appear to be in discomfort due to pain. Voluntary guarding noted. No signs of peritonitis.patient is overall nontoxic appearing. Vital signs and reassuring. Patient is afebrile, no tachycardia, no hypotension noted.  Laboratory was obtained. Mild leukocytosis of 13,000.creatinine is normal.UA shows no signs of infection. Patient denies any urinary symptoms. Anion gap is 16 however lactic acid is normal.unsure of etiology but needs to be trended will likely improve with fluid bolus.  CT scan was obtained due to significant abdominal tenderness, nausea, emesis, recent surgery. Postop changes were noted on the CT scan. No signs of infection or abscess. Does note a postop ileus likely causing patient's symptoms. Radiologist did call to inform the patient has a thrombus in her left ovarian vein.  Spoke with Dr. Chrissie Noa- Laurance Flatten with OB/GYN.States that ovarian thrombus is common after hysterectomies and that no further intervention needs to be performed this time. She does state that concerning the ileus she would like patient  admitted to her service for observation.the patient's pain has been controlled in the ED. She is tolerating by mouth fluids. Patient is overall well-appearing. Vital signs remained reassuring. Patient up-to-date on plan of care. Observation and admission orders were placed by myself and Dr. Delsa Sale will see patient after admission on the floor.  Final Clinical Impressions(s) / ED Diagnoses   Final diagnoses:  Ileus ([place])  Thrombosis of ovarian vein    New Prescriptions New Prescriptions   No medications on file     Aaron Edelman 06/21/17 2951  Palumbo, April, MD 06/21/17 [phone]

## 2017-06-21 NOTE — ED Notes (Signed)
Unable to collect labs patient is unable to lay on her back at this time.  I made nurse aware

## 2017-06-21 NOTE — H&P (Signed)
Gynecologic Oncology History and Physical  Stephanie Hancock 51 y.o. female  CC:  Chief Complaint  Patient presents with  . Abdominal Pain    HPI: Stephanie Hancock is a 51 year old female initially seen in consultation for symptomatic uterine fibroids, menometorrhagia, and pain.  She had a long history of fibroids and underwent a laparoscopic myomectomy with mini-lap in 2003.  She began developing pelvic pain again in 2017.  US performed confirmed multiple intramural fibroids and she was treated with depot lupron for 6 months until August 2017.  While on lupron, her bleeding and pain improved but with discontinuation, her pain worsened along with her uterine bleeding.  She underwent an endometrial biopsy on 04/04/17 with Dr. Quincy Simmonds resulting [place] WITH EXOGENOUS HORMONE EFFECT .  TV US was performed as well on 04/04/17 .    On 06/11/17, she underwent a robotic-assisted laparoscopic total hysterectomy >150gm with right salpingoophorectomy and left salpingectomy, with minilaparotomy, lysis of adhesions with Dr. Everitt Amber.  Her post-operative course was uneventful and she was discharged on POD 1.  Final pathology revealed: Uterus, cervix and bilateral fallopian tubes, right Ovary - CERVIX: UNREMARKABLE. - ENDOMETRIUM: FEATURES CONSISTENT WITH PRIOR PROCEDURE. - MYOMETRIUM: LEIOMYOMATA. - SEROSA: ADHESIONS. - RIGHT ADNEXA: BENIGN OVARY AND FALLOPIAN TUBE. - LEFT FALLOPIAN TUBE: UNREMARKABLE.    Interval History:  Stephanie Hancock presented to the ED last pm with complaints of nausea, emesis, and severe abdominal pain post-op.  She reports abdominal pain at the level of the umbilicus with radiation of the pain from her lateral abdomen.  She states the pain developed yesterday afternoon and she felt it was related to her bowels.  She took magnesium citrate and drank prune juice and had a solid bowel movement but stated the pain returned.  She then attempted to lay down but  had several episodes of emesis with pain.  She states she was brought into the ED in a wheelchair because she was unable to walk due to the pain.  She reports soaking her clothes with sweat and having chills at this time.  Reports passing flatus since surgery and up until today.  She reports having one BM since surgery then another yesterday afternoon.  She has had a decreased appetite since surgery but has been eating and drinking.  She reports abdominal pain with urination as well but no urgency, frequency, or dysuria.      Review of Systems Constitutional: Feels abdominal pain intermittently.  Positive for chills and sweats at home but no documented fever.  Decreased appetite after surgery.  Cardiovascular: No chest pain, shortness of breath, or edema.  Pulmonary: No cough or wheeze.  Gastrointestinal: Positive for nausea, vomiting.  No bright red blood per rectum. Genitourinary: No frequency, urgency, or dysuria. No vaginal bleeding or discharge.  Musculoskeletal: No myalgia or joint pain. Neurologic: No weakness, numbness, or change in gait.  Psychology: No depression, anxiety, or insomnia.  Current Meds:   Current Facility-Administered Medications:  .  dextrose 5% in lactated ringers with KCl 20 mEq/L infusion, , Intravenous, Continuous, Isidro Monks D, NP, Last Rate: 100 mL/hr at 06/21/17 1236 .  diphenhydrAMINE (BENADRYL) injection 12.5 mg, 12.5 mg, Intravenous, Q6H PRN, Lahoma Crocker, MD, 12.5 mg at 06/21/17 1045 .  iopamidol (ISOVUE-300) 61 % injection, , , ,  .  morphine 2 MG/ML injection 4 mg, 4 mg, Intravenous, Q4H PRN, Lahoma Crocker, MD, 4 mg at 06/21/17 1045 .  ondansetron (ZOFRAN) injection 4 mg,  4 mg, Intravenous, Q8H PRN, Anias Bartol D, NP  Allergy:  Allergies  Allergen Reactions  . Codeine Hives  . Kiwi Extract Swelling and Other (See Comments)    Tingling in throat/mouth swells.  . Levetiracetam Other (See Comments)    HALLUCINATIONS W/SUICIDAL  IDEATION  . Other     NO BLOOD PRODUCTS   . Powder Itching and Other (See Comments)    LATEX GLOVES WITH POWDER CAUSES ITCHING    Social Hx:   Social History   Social History  . Marital status: Married    Spouse name: N/A  . Number of children: N/A  . Years of education: N/A   Occupational History  . Not on file.   Social History Main Topics  . Smoking status: Never Smoker  . Smokeless tobacco: Never Used  . Alcohol use 1.2 - 2.4 oz/week    2 - 4 Cans of beer per week     Comment: 1-2 beers per day  . Drug use: No  . Sexual activity: Yes    Birth control/ protection: None   Other Topics Concern  . Not on file   Social History Narrative  . No narrative on file    Past Surgical Hx:  Past Surgical History:  Procedure Laterality Date  . ROBOTIC ASSISTED TOTAL HYSTERECTOMY Bilateral 06/11/2017   Procedure: XI ROBOTIC ASSISTED TOTAL HYSTERECTOMY BILATERAL SALPINGECTOMY, RIGHT OOPHERECTOMY,  LYSIS OF ADHESIONS ,MINI LAPAROTOMY;  Surgeon: Everitt Amber, MD;  Location: [facility];  Service: Gynecology;  Laterality: Bilateral;  . TUBAL LIGATION     then reversal  . UTERINE FIBROID SURGERY      Past Medical Hx:  Past Medical History:  Diagnosis Date  . Abnormal uterine bleeding   . Anemia   . Asthma   . Depression    hx of  . Fibroid   . Migraine    with aura  . Patient is [employer]hov[employer]'s Witness     Family Hx:  Family History  Problem Relation Age of Onset  . Hypertension Mother   . Diabetes Mother   . Osteoporosis Mother   . Hypertension Father   . Prostate cancer Father   . Heart attack Father   . Kidney cancer Sister    CT AP on 06/21/17: IMPRESSION: 1. Diffusely fluid-filled small bowel and proximal colon, suggestive of postoperative ileus. No transition point to suggest obstruction. 2. No evidence of abscess post recent hysterectomy. Expected postsurgical stranding in the pelvis. 3. 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 No extension into the renal vein or  adjacent inflammation to suggest thrombophlebitis.  CBC    Component Value Date/Time   WBC 13.7 (H) 06/21/2017 0145   RBC 5.10 06/21/2017 0145   HGB 15.6 (H) 06/21/2017 0145   HGB 13.1 04/26/2017 1436   HCT 44.2 06/21/2017 0145   HCT 39.3 04/26/2017 1436   PLT 349 06/21/2017 0145   PLT 215 04/26/2017 1436   MCV 86.7 06/21/2017 0145   MCV 88.1 04/26/2017 1436   MCH 30.6 06/21/2017 0145   MCHC 35.3 06/21/2017 0145   RDW 12.5 06/21/2017 0145   RDW 13.6 04/26/2017 1436   LYMPHSABS 1.6 06/21/2017 0145   LYMPHSABS 1.2 04/08/2017 1407   MONOABS 0.5 06/21/2017 0145   MONOABS 0.3 04/08/2017 1407   EOSABS 0.1 06/21/2017 0145   EOSABS 0.1 04/08/2017 1407   BASOSABS 0.0 06/21/2017 0145   BASOSABS 0.0 04/08/2017 1407   CMP Latest Ref Rng & Units 06/21/2017 06/12/2017 06/03/2017  Glucose 65 -  99 mg/dL 157(H) 125(H) 89  BUN 6 - 20 mg/dL 13 8 16   Creatinine 0.44 - 1.00 mg/dL 0.79 0.72 0.82  Sodium 135 - 145 mmol/L 141 138 138  Potassium 3.5 - 5.1 mmol/L 3.6 4.6 3.8  Chloride 101 - 111 mmol/L 105 107 108  CO2 22 - 32 mmol/L 20(L) 24 21(L)  Calcium 8.9 - 10.3 mg/dL 11.0(H) 9.4 9.7  Total Protein 6.5 - 8.1 g/dL 9.5(H) - 8.1  Total Bilirubin 0.3 - 1.2 mg/dL 0.6 - 0.5  Alkaline Phos 38 - 126 U/L 95 - 65  AST 15 - 41 U/L 26 - 23  ALT 14 - 54 U/L 51 - 20    Vitals:  Blood pressure 133/84, pulse 98, temperature 98.6 F (37 C), temperature source Oral, resp. rate 16, height 5\' 2"  (1.575 m), weight 140 lb (63.5 kg), last menstrual period 03/29/2017, SpO2 99 %.  Physical Exam:  General: Well developed, well nourished female in no acute distress. Alert and oriented x 3. Resting comfortably.   Cardiovascular: Regular rate and rhythm. S1 and S2 normal.  Lungs: Clear to auscultation bilaterally. No wheezes/crackles/rhonchi noted.  Skin: No rashes or lesions present. Back: No CVA tenderness.  Abdomen: Abdomen soft, tender intermittently and non-obese. Active bowel sounds left upper quadrant,  hypoactive in other quadrants. Non-tympanic.  Lap sites to the abdomen and mini-lap site midline in lower abdomen intact with no erythema or drainage.  Dermabond remains in place.  Mild grimacing with light palpation of the abdomen that resolved after 3 seconds with no grimacing when using stethoscope.   Extremities: No bilateral cyanosis, edema, or clubbing.   Assessment/Plan:  51 year old s/p robotic-assisted laparoscopic total hysterectomy >150gm with right salpingoophorectomy and left salpingectomy, with minilaparotomy, lysis of adhesions with Dr. Denman George for symptomatic fibroids currently admitted with probable post-operative ileus.  Repeat CBC and Bmet ordered for 14:30.  Labs in the am.  Begin IVF D5LR +20 meq KCL for rehydration.  IV morphine ordered for pain and zofran for nausea.Continue plan of care per Dr. Delsa Sale.          Daviana Haymaker DEAL, NP 06/21/2017, 1:15 PM

## 2017-06-21 NOTE — ED Notes (Signed)
Patient transported to CT 

## 2017-06-21 NOTE — ED Notes (Signed)
This RN attempted IV access x2 without success. Charge RN to try.

## 2017-06-22 DIAGNOSIS — K567 Ileus, unspecified: Secondary | ICD-10-CM | POA: Diagnosis present

## 2017-06-22 DIAGNOSIS — Z833 Family history of diabetes mellitus: Secondary | ICD-10-CM | POA: Diagnosis not present

## 2017-06-22 DIAGNOSIS — Z8051 Family history of malignant neoplasm of kidney: Secondary | ICD-10-CM | POA: Diagnosis not present

## 2017-06-22 DIAGNOSIS — Z8249 Family history of ischemic heart disease and other diseases of the circulatory system: Secondary | ICD-10-CM | POA: Diagnosis not present

## 2017-06-22 DIAGNOSIS — Z91018 Allergy to other foods: Secondary | ICD-10-CM | POA: Diagnosis not present

## 2017-06-22 DIAGNOSIS — J45909 Unspecified asthma, uncomplicated: Secondary | ICD-10-CM | POA: Diagnosis present

## 2017-06-22 DIAGNOSIS — Z8042 Family history of malignant neoplasm of prostate: Secondary | ICD-10-CM | POA: Diagnosis not present

## 2017-06-22 DIAGNOSIS — Z90710 Acquired absence of both cervix and uterus: Secondary | ICD-10-CM | POA: Diagnosis not present

## 2017-06-22 DIAGNOSIS — D649 Anemia, unspecified: Secondary | ICD-10-CM | POA: Diagnosis present

## 2017-06-22 DIAGNOSIS — Z885 Allergy status to narcotic agent status: Secondary | ICD-10-CM | POA: Diagnosis not present

## 2017-06-22 DIAGNOSIS — K9189 Other postprocedural complications and disorders of digestive system: Secondary | ICD-10-CM | POA: Diagnosis present

## 2017-06-22 DIAGNOSIS — Z8262 Family history of osteoporosis: Secondary | ICD-10-CM | POA: Diagnosis not present

## 2017-06-22 DIAGNOSIS — F329 Major depressive disorder, single episode, unspecified: Secondary | ICD-10-CM | POA: Diagnosis present

## 2017-06-22 DIAGNOSIS — Z7951 Long term (current) use of inhaled steroids: Secondary | ICD-10-CM | POA: Diagnosis not present

## 2017-06-22 DIAGNOSIS — Z888 Allergy status to other drugs, medicaments and biological substances status: Secondary | ICD-10-CM | POA: Diagnosis not present

## 2017-06-22 LAB — BASIC METABOLIC PANEL
ANION GAP: 6 (ref 5–15)
Anion gap: 5 (ref 5–15)
BUN: 7 mg/dL (ref 6–20)
CALCIUM: 8.9 mg/dL (ref 8.9–10.3)
CALCIUM: 9.3 mg/dL (ref 8.9–10.3)
CO2: 23 mmol/L (ref 22–32)
CO2: 22 mmol/L (ref 22–32)
CREATININE: 0.79 mg/dL (ref 0.44–1.00)
Chloride: 111 mmol/L (ref 101–111)
Chloride: 110 mmol/L (ref 101–111)
Creatinine, Ser: 0.82 mg/dL (ref 0.44–1.00)
GFR calc Af Amer: >60 mL/min (ref >60)
GFR calc non Af Amer: >60 mL/min (ref >60)
Glucose, Bld: 120 mg/dL — ABNORMAL HIGH (ref 65–99)
Glucose, Bld: 134 mg/dL — ABNORMAL HIGH (ref 65–99)
POTASSIUM: 4.2 mmol/L (ref 3.5–5.1)
Potassium: 4 mmol/L (ref 3.5–5.1)
SODIUM: 139 mmol/L (ref 135–145)
SODIUM: 138 mmol/L (ref 135–145)

## 2017-06-22 LAB — CBC
HCT: 35.2 % — ABNORMAL LOW (ref 36.0–46.0)
Hemoglobin: 11.9 g/dL — ABNORMAL LOW (ref 12.0–15.0)
MCH: 29.6 pg (ref 26.0–34.0)
MCHC: 33.8 g/dL (ref 30.0–36.0)
MCV: 87.6 fL (ref 78.0–100.0)
PLATELETS: 273 10*3/uL (ref 150–400)
RBC: 4.02 MIL/uL (ref 3.87–5.11)
RDW: 12.5 % (ref 11.5–15.5)
WBC: 8.6 10*3/uL (ref 4.0–10.5)

## 2017-06-22 LAB — CBC WITH DIFFERENTIAL/PLATELET
BASOS PCT: 1 %
Basophils Absolute: 0 10*3/uL (ref 0.0–0.1)
EOS PCT: 6 %
Eosinophils Absolute: 0.4 10*3/uL (ref 0.0–0.7)
HCT: 32.2 % — ABNORMAL LOW (ref 36.0–46.0)
Hemoglobin: 10.8 g/dL — ABNORMAL LOW (ref 12.0–15.0)
Lymphocytes Relative: 25 %
Lymphs Abs: 1.6 10*3/uL (ref 0.7–4.0)
MCH: 29 pg (ref 26.0–34.0)
MCHC: 33.5 g/dL (ref 30.0–36.0)
MCV: 86.6 fL (ref 78.0–100.0)
MONO ABS: 0.2 10*3/uL (ref 0.1–1.0)
MONOS PCT: 4 %
NEUTROS PCT: 64 %
Neutro Abs: 4.1 10*3/uL (ref 1.7–7.7)
PLATELETS: 246 10*3/uL (ref 150–400)
RBC: 3.72 MIL/uL — ABNORMAL LOW (ref 3.87–5.11)
RDW: 12.5 % (ref 11.5–15.5)
WBC: 6.3 10*3/uL (ref 4.0–10.5)

## 2017-06-22 MED ORDER — TRAMADOL HCL 50 MG PO TABS
50.0000 mg | ORAL_TABLET | Freq: Four times a day (QID) | ORAL | Status: DC | PRN
  Administered 2017-06-22 – 2017-06-23 (×4): 50 mg via ORAL
  Filled 2017-06-22 (×4): qty 1

## 2017-06-22 NOTE — Progress Notes (Signed)
Ambulated this am also had a medium BM.

## 2017-06-22 NOTE — Progress Notes (Signed)
   Subjective: Patient reports +flatus. No N/V/BM.    Objective: Vital signs in last 24 hours: Temp:  [98.5 F (36.9 C)-99.9 F (37.7 C)] 99.9 F (37.7 C) (09/15 0506) Pulse Rate:  [81-98] 83 (09/15 0506) Resp:  [16-18] 18 (09/15 0506) BP: (111-136)/(68-84) 111/68 (09/15 0506) SpO2:  [99 %-100 %] 100 % (09/15 0506) Last BM Date: 06/20/17  Intake/Output from previous day: 09/14 0701 - 09/15 0700 In: 923.3 [P.O.:240; I.V.:633.3; IV Piggyback:50] Out: -   Physical Examination: General: alert Resp: clear to auscultation bilaterally Cardio: regular rate and rhythm, S1, S2 normal, no murmur, click, rub or gallop GI: soft, mild distension, mild periumbilical tenderness, no rebound/guarding Extremities: extremities normal, atraumatic, no cyanosis or edema  Labs: WBC/Hgb/Hct/Plts:  6.3/10.8/32.2/246 (09/15 0401) BUN/Cr/glu/ALT/AST/amyl/lip:  7/0.79/--/--/--/--/-- (09/15 0401)   Assessment:  51 y.o. s/p : TRH/adhesiolysis Pain:  Pain is well-controlled on current medications.  She has minimal pain  ID: Low grade fever.  No SIRS at this point  GI:  Resolving ileus Tolerating po: Yes     FEN: Electrolytes in range  Prophylaxis: intermittent pneumatic compression boots.  Plan: Encourage ambulation Clear liquids Monitor temperature curve Serial labs/exam   LOS: 0 days    JACKSON-MOORE,Ragina Fenter A 06/22/2017, 9:04 AM     Patient ID: Stephanie Hancock, female   DOB: 1966/03/19, 51 y.o.   MRN: [id number]

## 2017-06-23 LAB — CBC WITH DIFFERENTIAL/PLATELET
BASOS ABS: 0 10*3/uL (ref 0.0–0.1)
BASOS PCT: 1 %
Eosinophils Absolute: 0.4 10*3/uL (ref 0.0–0.7)
Eosinophils Relative: 7 %
HEMATOCRIT: 33.5 % — AB (ref 36.0–46.0)
HEMOGLOBIN: 11.1 g/dL — AB (ref 12.0–15.0)
Lymphocytes Relative: 32 %
Lymphs Abs: 2 10*3/uL (ref 0.7–4.0)
MCH: 30.2 pg (ref 26.0–34.0)
MCHC: 33.1 g/dL (ref 30.0–36.0)
MCV: 91.3 fL (ref 78.0–100.0)
Monocytes Absolute: 0.3 10*3/uL (ref 0.1–1.0)
Monocytes Relative: 4 %
NEUTROS ABS: 3.5 10*3/uL (ref 1.7–7.7)
Neutrophils Relative %: 56 %
Platelets: 260 10*3/uL (ref 150–400)
RBC: 3.67 MIL/uL — AB (ref 3.87–5.11)
RDW: 13 % (ref 11.5–15.5)
WBC: 6.2 10*3/uL (ref 4.0–10.5)

## 2017-06-23 LAB — BASIC METABOLIC PANEL
Anion gap: 7 (ref 5–15)
BUN: <5 mg/dL — ABNORMAL LOW (ref 6–20)
CALCIUM: 9.2 mg/dL (ref 8.9–10.3)
CHLORIDE: 108 mmol/L (ref 101–111)
CO2: 22 mmol/L (ref 22–32)
CREATININE: 0.76 mg/dL (ref 0.44–1.00)
Glucose, Bld: 121 mg/dL — ABNORMAL HIGH (ref 65–99)
Potassium: 4 mmol/L (ref 3.5–5.1)
SODIUM: 137 mmol/L (ref 135–145)

## 2017-06-23 NOTE — Progress Notes (Signed)
Patient discharged to home with family, discharge instructions reviewed with Patient who verbalized understanding. No new RX for patient.

## 2017-06-23 NOTE — Discharge Instructions (Signed)
Ileus Ileus is a condition in which the intestines, also called the bowels, stop working and moving correctly. If the intestines stop working, food cannot pass through to get digested. The intestines are hollow organs that digest food after the food leaves the stomach. These organs are long, muscular tubes that connect the stomach to the rectum. When ileus occurs, the muscular contractions that cause food to move through the intestines stop happening as they normally would. Ileus can occur for various reasons. This condition is a serious problem that usually requires hospitalization. It can cause symptoms such as nausea, abdominal pain, and bloating. Ileus can last from a few hours to a few days. If the intestines stop working because of a blockage, that is a different condition that is called a bowel obstruction. What are the causes? This condition may be caused by:  Surgery on the abdomen.  An infection or inflammation in the abdomen. This includes inflammation of the lining of the abdomen (peritonitis).  Infection or inflammation in other parts of the body, such as pneumonia or pancreatitis.  Passage of gallstones or kidney stones.  Damage to the nerves or blood vessels that go to the intestines.  A collection of blood within the abdominal cavity.  Imbalance in the salts in the blood (electrolytes).  Injury to the brain or spinal cord.  Medicines. Many medicines, including strong pain medicines, can cause ileus or make it worse.  What are the signs or symptoms? Symptoms of this condition include:  Bloating of the abdomen.  Pain or discomfort in the abdomen.  Poor appetite.  Nausea and vomiting.  Lack of normal bowel sounds, such as growling" in the stomach.  How is this diagnosed? This condition may be diagnosed with:  A physical exam and medical history.  X-rays or a CT scan of the abdomen.  You may also have other tests to help find the cause of the  condition. How is this treated? Treatment for this condition may include:  Resting the intestines until they start to work again. This is often done by: ? Stopping oral intake of food and drink. You will be given fluid through an IV tube to prevent dehydration. ? Placing a small tube (nasogastric tube or NG tube) that is passed through your nose and into your stomach. The tube is attached to a suction device and keeps the stomach emptied out. This allows the bowels to rest and also helps to reduce nausea and vomiting.  Correcting any electrolyte imbalance by giving supplements in the IV fluid.  Stopping any medicines that might make ileus worse.  Treating any condition that may have caused ileus.  Follow these instructions at home:  Follow instructions from your health care provider about diet and fluid intake. Usually, you will be told to: ? Drink plenty of clear fluids. ? Avoid alcohol. ? Avoid caffeine. ? Eat a bland diet.  Get plenty of rest. Return to your normal activities as told by your health care provider.  Take over-the-counter and prescription medicines only as told by your health care provider.  Keep all follow-up visits as told by your health care provider. This is important. Contact a health care provider if:  You have nausea, vomiting, or abdominal discomfort.  You have a fever. Get help right away if:  You have severe abdominal pain or bloating.  You cannot eat or drink without vomiting. This information is not intended to replace advice given to you by your health care provider. Make sure  you discuss any questions you have with your health care provider. Document Released: 09/27/2003 Document Revised: 03/01/2016 Document Reviewed: 11/18/2014 Elsevier Interactive Patient Education  2018 [employer]. Constipation, Adult Constipation is when a person has fewer bowel movements in a week than normal, has difficulty having a bowel movement, or has stools that  are dry, hard, or larger than normal. Constipation may be caused by an underlying condition. It may become worse with age if a person takes certain medicines and does not take in enough fluids. Follow these instructions at home: Eating and drinking   Eat foods that have a lot of fiber, such as fresh fruits and vegetables, whole grains, and beans.  Limit foods that are high in fat, low in fiber, or overly processed, such as french fries, hamburgers, cookies, candies, and soda.  Drink enough fluid to keep your urine clear or pale yellow. General instructions  Exercise regularly or as told by your health care provider.  Go to the restroom when you have the urge to go. Do not hold it in.  Take over-the-counter and prescription medicines only as told by your health care provider. These include any fiber supplements.  Practice pelvic floor retraining exercises, such as deep breathing while relaxing the lower abdomen and pelvic floor relaxation during bowel movements.  Watch your condition for any changes.  Keep all follow-up visits as told by your health care provider. This is important. Contact a health care provider if:  You have pain that gets worse.  You have a fever.  You do not have a bowel movement after 4 days.  You vomit.  You are not hungry.  You lose weight.  You are bleeding from the anus.  You have thin, pencil-like stools. Get help right away if:  You have a fever and your symptoms suddenly get worse.  You leak stool or have blood in your stool.  Your abdomen is bloated.  You have severe pain in your abdomen.  You feel dizzy or you faint. This information is not intended to replace advice given to you by your health care provider. Make sure you discuss any questions you have with your health care provider. Document Released: 06/22/2004 Document Revised: 04/13/2016 Document Reviewed: 03/14/2016 Elsevier Interactive Patient Education  2017 [employer].

## 2017-06-23 NOTE — Discharge Summary (Signed)
Physician Discharge Summary  Patient ID: Stephanie Hancock MRN: [id number] DOB/AGE: 1966-05-28 51 y.o.  Admit date: 06/20/2017 Discharge date: 06/23/2017  Admission Diagnoses: Ileus, postoperative Discharge Diagnoses:  Active Problems:   Ileus ([place])   Ileus, postoperative [facility])   Discharged Condition: good  Hospital Course: The patient was admitted for bowel rest, supportive care, serial exams/labs.  On HD#0, the WBC trended down from 14,000 to normal range.  Serial labs/electrolytes remained in range.  She had no further episodes of N/V or severe abdominal pain.  On HD#1, a tmax was 99.9.  Subsequently there were no further low grade temperature elevations. On HD#2 she was advanced to a regular diet prior to discharge.  Consults: None  Significant Diagnostic Studies: labs: CBC, CMET and radiology: CT scan: abdomen/pelvis  Treatments: IV hydration  Discharge Exam: Blood pressure 130/83, pulse 85, temperature 98 F (36.7 C), temperature source Oral, resp. rate 16, height 5\' 2"  (1.575 m), weight 154 lb 5.2 oz (70 kg), last menstrual period 03/29/2017, SpO2 98 %. General appearance: alert Resp: clear to auscultation bilaterally Cardio: regular rate and rhythm, S1, S2 normal, no murmur, click, rub or gallop GI: soft, non-tender; bowel sounds normal; no masses,  no organomegaly Extremities: extremities normal, atraumatic, no cyanosis or edema Incision/Wound: C/D/I  Disposition: 01-Home or Self Care  Discharge Instructions    Activity as tolerated - No restrictions    Complete by:  As directed    Call MD for:  extreme fatigue    Complete by:  As directed    Call MD for:  persistant dizziness or light-headedness    Complete by:  As directed    Call MD for:  persistant nausea and vomiting    Complete by:  As directed    Call MD for:  redness, tenderness, or signs of infection (pain, swelling, redness, odor or green/yellow discharge around incision site)    Complete by:  As directed     Call MD for:  severe uncontrolled pain    Complete by:  As directed    Call MD for:  temperature >100.4    Complete by:  As directed    Diet - low sodium heart healthy    Complete by:  As directed    Diet Carb Modified    Complete by:  As directed    Diet general    Complete by:  As directed    Increase activity slowly    Complete by:  As directed    Lifting restrictions    Complete by:  As directed    No lifting > 5 lbs for 6 weeks   May shower / Bathe    Complete by:  As directed    No tub baths for 6 weeks   Sexual Activity Restrictions    Complete by:  As directed    No intercourse for 6 - 8 weeks     Allergies as of 06/23/2017      Reactions   Codeine Hives   Kiwi Extract Swelling, Other (See Comments)   Tingling in throat/mouth swells.   Levetiracetam Other (See Comments)   HALLUCINATIONS W/SUICIDAL IDEATION   Other    NO BLOOD PRODUCTS   Powder Itching, Other (See Comments)   LATEX GLOVES WITH POWDER CAUSES ITCHING      Medication List    TAKE these medications   albuterol 108 (90 Base) MCG/ACT inhaler Commonly known as:  PROVENTIL HFA;VENTOLIN HFA Inhale 1-2 puffs into the lungs every 6 (six) hours as  needed for wheezing or shortness of breath.   albuterol (5 MG/ML) 0.5% nebulizer solution Commonly known as:  PROVENTIL Take 2.5 mg by nebulization every 6 (six) hours as needed for wheezing or shortness of breath.   aspirin-acetaminophen-caffeine 250-250-65 MG tablet Commonly known as:  EXCEDRIN MIGRAINE Take 3 tablets by mouth 3 (three) times daily as needed for headache.   ferrous gluconate 225 (27 Fe) MG tablet Commonly known as:  FERGON Take 1 tablet (240 mg total) by mouth 3 (three) times daily with meals.   flurbiprofen 100 MG tablet Commonly known as:  ANSAID Take 100 mg by mouth See admin instructions. TAKE 1 TABLET (100 MG) BY MOUTH DAILY AS NEEDED FOR MIGRAINE HEADACHES (DO NOT EXCEED 4 TABLETS IN A WEEK)   fluticasone-salmeterol 45-21  MCG/ACT inhaler Commonly known as:  ADVAIR HFA Inhale 2 puffs into the lungs 2 (two) times daily.   HYDROmorphone 2 MG tablet Commonly known as:  DILAUDID Take 1 tablet (2 mg total) by mouth every 4 (four) hours as needed for severe pain.   ibuprofen 200 MG tablet Commonly known as:  ADVIL,MOTRIN Take 400 mg by mouth every 6 (six) hours as needed for moderate pain.   senna 8.6 MG Tabs tablet Commonly known as:  SENOKOT Take 1 tablet (8.6 mg total) by mouth at bedtime.   SUMAtriptan 25 MG tablet Commonly known as:  IMITREX Take 25 mg by mouth every 2 (two) hours as needed for migraine (disintigating tablet). May repeat in 2 hours if headache persists or recurs.   zolmitriptan 5 MG disintegrating tablet Commonly known as:  ZOMIG-ZMT TK 1 T PO PRF MIGRAINE. MAY REPEAT IN 2 H PRN. NO MORE THAN 2 IN 24 H FOR 2 TO 3 DAYS            Discharge Care Instructions        Start     Ordered   06/23/17 0000  Activity as tolerated - No restrictions     06/23/17 1106   06/23/17 0000  Increase activity slowly     06/23/17 1106   06/23/17 0000  May shower / Bathe    Comments:  No tub baths for 6 weeks   06/23/17 1106   06/23/17 0000  Lifting restrictions    Comments:  No lifting > 5 lbs for 6 weeks   06/23/17 1106   06/23/17 0000  Sexual Activity Restrictions    Comments:  No intercourse for 6 - 8 weeks   06/23/17 1106   06/23/17 0000  Diet - low sodium heart healthy     06/23/17 1106   06/23/17 0000  Diet general     06/23/17 1106   06/23/17 0000  Diet Carb Modified     06/23/17 1106   06/23/17 0000  Call MD for:  temperature >100.4     06/23/17 1106   06/23/17 0000  Call MD for:  persistant nausea and vomiting     06/23/17 1106   06/23/17 0000  Call MD for:  severe uncontrolled pain     06/23/17 1106   06/23/17 0000  Call MD for:  redness, tenderness, or signs of infection (pain, swelling, redness, odor or green/yellow discharge around incision site)     06/23/17 1106    06/23/17 0000  Call MD for:  persistant dizziness or light-headedness     06/23/17 1106   06/23/17 0000  Call MD for:  extreme fatigue     06/23/17 1106  SignedLahoma Crocker A 06/23/2017, 11:06 AM

## 2017-06-27 ENCOUNTER — Ambulatory Visit: Payer: BLUE CROSS/BLUE SHIELD

## 2017-06-28 ENCOUNTER — Ambulatory Visit: Payer: BLUE CROSS/BLUE SHIELD

## 2017-06-28 ENCOUNTER — Telehealth: Payer: Self-pay | Admitting: *Deleted

## 2017-06-28 NOTE — Telephone Encounter (Signed)
Husband called and left a message that he needed FMLA paperwork finished. Attempted to contact the patient with no answer, LMOM with the office fax number

## 2017-07-08 ENCOUNTER — Ambulatory Visit: Payer: BLUE CROSS/BLUE SHIELD | Attending: Gynecologic Oncology | Admitting: Gynecologic Oncology

## 2017-07-08 ENCOUNTER — Encounter: Payer: Self-pay | Admitting: Gynecologic Oncology

## 2017-07-08 VITALS — BP 114/71 | HR 80 | Temp 98.3°F | Resp 18 | Ht 62.0 in | Wt 138.3 lb

## 2017-07-08 DIAGNOSIS — F329 Major depressive disorder, single episode, unspecified: Secondary | ICD-10-CM | POA: Diagnosis not present

## 2017-07-08 DIAGNOSIS — Z8051 Family history of malignant neoplasm of kidney: Secondary | ICD-10-CM | POA: Diagnosis not present

## 2017-07-08 DIAGNOSIS — Z8742 Personal history of other diseases of the female genital tract: Secondary | ICD-10-CM | POA: Diagnosis not present

## 2017-07-08 DIAGNOSIS — N939 Abnormal uterine and vaginal bleeding, unspecified: Secondary | ICD-10-CM | POA: Diagnosis present

## 2017-07-08 DIAGNOSIS — D251 Intramural leiomyoma of uterus: Secondary | ICD-10-CM

## 2017-07-08 DIAGNOSIS — Z09 Encounter for follow-up examination after completed treatment for conditions other than malignant neoplasm: Secondary | ICD-10-CM | POA: Diagnosis not present

## 2017-07-08 DIAGNOSIS — Z90722 Acquired absence of ovaries, bilateral: Secondary | ICD-10-CM | POA: Diagnosis not present

## 2017-07-08 DIAGNOSIS — Z8042 Family history of malignant neoplasm of prostate: Secondary | ICD-10-CM | POA: Insufficient documentation

## 2017-07-08 DIAGNOSIS — G43101 Migraine with aura, not intractable, with status migrainosus: Secondary | ICD-10-CM | POA: Diagnosis not present

## 2017-07-08 DIAGNOSIS — D649 Anemia, unspecified: Secondary | ICD-10-CM | POA: Diagnosis present

## 2017-07-08 DIAGNOSIS — Z90710 Acquired absence of both cervix and uterus: Secondary | ICD-10-CM | POA: Diagnosis not present

## 2017-07-08 DIAGNOSIS — Z9079 Acquired absence of other genital organ(s): Secondary | ICD-10-CM | POA: Diagnosis not present

## 2017-07-08 NOTE — Patient Instructions (Signed)
Plan to follow up with Dr. Pamala Hurry for your well woman checks.  Please call for any concerns.  No intercourse for another month and when the spotting has stopped.

## 2017-07-08 NOTE — Progress Notes (Signed)
Follow-up Note: Gyn-Onc  Consult was requested by Dr. Pamala Hurry and Dr Quincy Simmonds or the evaluation of Merridy Pascoe 51 y.o. female  CC:  Chief Complaint  Patient presents with  . Leiomyoma, intramural    Assessment/Plan:  Ms. Liisa Picone  is a 51 y.o.  year old with history of symptomatic uterine fibroids with anemia and menometorrhagia and pain. She is a Restaurant manager, fast food.  S/p robotic hysterectomy, bilateral salpingectomy,right oophorectomy on 06/11/17.  She is doing well now postop with no issues.  HPI: Macee Venables is a 51 year old woman (P2) who is seen as a new patient for symptomatic uterine fibroids, menometorrhagia and pain. She is a Restaurant manager, fast food.  The patient has a long standing history of fibroids. She underwent laparoscopic myomectomy with minilaparotomy for specimen delivery in 2003. She did well postoperatively until 2017 when she began developing "severe pelvic pains" which are present all the time and worse with bowel movements and urination and lying flat at night. She also reports her menses becoming heavy at that time. Ultrasound confirmed multiple intramural fibroids. She was treated with 6 months of Depot Lupron until August, 2017. During that time the bleeding improved and the fibroids decreased in volume. After she came off the lupron her pain and menses became worse than ever. She was started on Depot Provera which somewhat improved the bleeding symptoms but not the pain.  She was seen by Dr Quincy Simmonds on 03/29/17 and an endometrial biopsy was taken (not yet resulted). A TVUS showed a uterus measuring 12x9x9.2cm with multiple intramural fibroids.  The symptoms are quality of life limiting. She is a severe asthmatic but controlled on inhaled agents and not on oral steroids. She has had a laparoscopic myomectomy but no other abdominal surgeries. She has had 2 vaginal deliveries. She is treated for migraine headaches.  Endometrial biopsy was benign. She was prescribed  lupron. She became amenorrheic. Hb was 13 then 14.  Persistent mass symptoms.  Interval Hx:  On 06/11/17 she underwent robotic assisted total hyst (>250gm) with bilateral salpingectomy and RSO for fibroids, endometriosis and adhesions. Final pathology was benign. 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  She developed a postop ileus on POD9 and was readmitted for observation with IVF's and expectant management and discharged at approximately 48 hours when she had clinically improved. Her CT during this admission showed no leak, collection or obstruction.   Current Meds:  Outpatient Encounter Prescriptions as of 07/08/2017  Medication Sig  . albuterol (PROVENTIL HFA;VENTOLIN HFA) 108 (90 BASE) MCG/ACT inhaler Inhale 1-2 puffs into the lungs every 6 (six) hours as needed for wheezing or shortness of breath.  Marland Kitchen albuterol (PROVENTIL) (5 MG/ML) 0.5% nebulizer solution Take 2.5 mg by nebulization every 6 (six) hours as needed for wheezing or shortness of breath.  Marland Kitchen aspirin-acetaminophen-caffeine (EXCEDRIN MIGRAINE) 250-250-65 MG tablet Take 3 tablets by mouth 3 (three) times daily as needed for headache.   . ferrous gluconate (FERGON) 225 (27 Fe) MG tablet Take 1 tablet (240 mg total) by mouth 3 (three) times daily with meals.  . flurbiprofen (ANSAID) 100 MG tablet Take 100 mg by mouth See admin instructions. TAKE 1 TABLET (100 MG) BY MOUTH DAILY AS NEEDED FOR MIGRAINE HEADACHES (DO NOT EXCEED 4 TABLETS IN A WEEK)  . fluticasone-salmeterol (ADVAIR HFA) 45-21 MCG/ACT inhaler Inhale 2 puffs into the lungs 2 (two) times daily.  Marland Kitchen ibuprofen (ADVIL,MOTRIN) 200 MG tablet Take 400 mg by mouth every 6 (six) hours as needed  for moderate pain.  Marland Kitchen senna (SENOKOT) 8.6 MG TABS tablet Take 1 tablet (8.6 mg total) by mouth at bedtime.  . SUMAtriptan (IMITREX) 25 MG tablet Take 25 mg by mouth every 2 (two) hours as needed for migraine (disintigating tablet). May repeat in 2 hours if headache persists  or recurs.  Marland Kitchen zolmitriptan (ZOMIG-ZMT) 5 MG disintegrating tablet TK 1 T PO PRF MIGRAINE. MAY REPEAT IN 2 H PRN. NO MORE THAN 2 IN 24 H FOR 2 TO 3 DAYS  . [DISCONTINUED] HYDROmorphone (DILAUDID) 2 MG tablet Take 1 tablet (2 mg total) by mouth every 4 (four) hours as needed for severe pain. (Patient not taking: Reported on 06/21/2017)   No facility-administered encounter medications on file as of 07/08/2017.     Allergy:  Allergies  Allergen Reactions  . Codeine Hives  . Kiwi Extract Swelling and Other (See Comments)    Tingling in throat/mouth swells.  . Levetiracetam Other (See Comments)    HALLUCINATIONS W/SUICIDAL IDEATION  . Other     NO BLOOD PRODUCTS   . Powder Itching and Other (See Comments)    LATEX GLOVES WITH POWDER CAUSES ITCHING    Social Hx:   Social History   Social History  . Marital status: Married    Spouse name: N/A  . Number of children: N/A  . Years of education: N/A   Occupational History  . Not on file.   Social History Main Topics  . Smoking status: Never Smoker  . Smokeless tobacco: Never Used  . Alcohol use 1.2 - 2.4 oz/week    2 - 4 Cans of beer per week     Comment: 1-2 beers per day  . Drug use: No  . Sexual activity: Yes    Birth control/ protection: None   Other Topics Concern  . Not on file   Social History Narrative  . No narrative on file    Past Surgical Hx:  Past Surgical History:  Procedure Laterality Date  . ROBOTIC ASSISTED TOTAL HYSTERECTOMY Bilateral 06/11/2017   Procedure: XI ROBOTIC ASSISTED TOTAL HYSTERECTOMY BILATERAL SALPINGECTOMY, RIGHT OOPHERECTOMY,  LYSIS OF ADHESIONS ,MINI LAPAROTOMY;  Surgeon: Everitt Amber, MD;  Location: [facility];  Service: Gynecology;  Laterality: Bilateral;  . TUBAL LIGATION     then reversal  . UTERINE FIBROID SURGERY      Past Medical Hx:  Past Medical History:  Diagnosis Date  . Abnormal uterine bleeding   . Anemia   . Asthma   . Depression    hx of  . Fibroid   . Migraine    with  aura  . Patient is Jehov[employer]'s Witness     Past Gynecological History:  Menometorrhagia. + SVD x 2 Patient's last menstrual period was 03/29/2017.  Family Hx:  Family History  Problem Relation Age of Onset  . Hypertension Mother   . Diabetes Mother   . Osteoporosis Mother   . Hypertension Father   . Prostate cancer Father   . Heart attack Father   . Kidney cancer Sister     Review of Systems:  Constitutional  Feels  fatigued  ENT Normal appearing ears and nares bilaterally Skin/Breast  No rash, sores, jaundice, itching, dryness Cardiovascular  No chest pain, shortness of breath, or edema  Pulmonary  No cough or wheeze.  Gastro Intestinal  No nausea, vomitting, or diarrhoea. No bright red blood per rectum, no abdominal pain, change in bowel movement, or constipation.  Genito Urinary  No frequency, urgency, dysuria, no  vaginal bleeding Musculo Skeletal  No myalgia, arthralgia, joint swelling or pain  Neurologic  + headaches Psychology  No depression, anxiety, insomnia.   Vitals:  Blood pressure 114/71, pulse 80, temperature 98.3 F (36.8 C), temperature source Oral, resp. rate 18, height 5\' 2"  (1.575 m), weight 138 lb 4.8 oz (62.7 kg), last menstrual period 03/29/2017, SpO2 100 %.  Physical Exam: WD in NAD Neck  Supple NROM, without any enlargements.  Lymph Node Survey No cervical supraclavicular or inguinal adenopathy Cardiovascular  Pulse normal rate, regularity and rhythm. S1 and S2 normal.  Lungs  Clear to auscultation bilateraly, without wheezes/crackles/rhonchi. Good air movement.  Skin  No rash/lesions/breakdown  Psychiatry  Alert and oriented to person, place, and time  Abdomen  Normoactive bowel sounds, abdomen soft, non-tender and slightly overweight/obese without evidence of hernia. Mini laparotomy incision and robotic incisions are healing well. Back No CVA tenderness Genito Urinary  : vaginal cuff in tact, pink discharge, no active  bleeding. Rectal  deferred  Extremities  No bilateral cyanosis, clubbing or edema.  Donaciano Eva, MD  07/08/2017, 12:20 PM

## 2017-07-12 ENCOUNTER — Telehealth: Payer: Self-pay | Admitting: Gynecologic Oncology

## 2017-07-12 NOTE — Telephone Encounter (Signed)
Returned call to patient.  All questions answered.  Patient concerned about bruise near an IV site from her recent hospitalization and two bumps on her right hand where an IV was placed.  Advised to monitor the area and to call for any changes (redness, increased warmth, drainage).

## 2017-07-15 ENCOUNTER — Other Ambulatory Visit: Payer: BLUE CROSS/BLUE SHIELD

## 2017-07-15 ENCOUNTER — Inpatient Hospital Stay
Admission: RE | Admit: 2017-07-15 | Discharge: 2017-07-15 | Disposition: A | Discharge status: Home / Self Care | Payer: BLUE CROSS/BLUE SHIELD | Source: Ambulatory Visit | Attending: Neurology | Admitting: Neurology

## 2017-07-22 ENCOUNTER — Other Ambulatory Visit: Payer: Self-pay | Admitting: Family Medicine

## 2017-07-22 DIAGNOSIS — G43719 Chronic migraine without aura, intractable, without status migrainosus: Secondary | ICD-10-CM

## 2017-07-24 ENCOUNTER — Ambulatory Visit
Admission: RE | Admit: 2017-07-24 | Discharge: 2017-07-24 | Disposition: A | Discharge status: Home / Self Care | Payer: BLUE CROSS/BLUE SHIELD | Source: Ambulatory Visit | Attending: Family Medicine | Admitting: Family Medicine

## 2017-07-24 DIAGNOSIS — G43719 Chronic migraine without aura, intractable, without status migrainosus: Secondary | ICD-10-CM

## 2017-07-24 DIAGNOSIS — G43909 Migraine, unspecified, not intractable, without status migrainosus: Secondary | ICD-10-CM | POA: Diagnosis not present

## 2017-07-24 MED ORDER — GADOBENATE DIMEGLUMINE 529 MG/ML IV SOLN
13.0000 mL | Freq: Once | INTRAVENOUS | Status: AC | PRN
  Administered 2017-07-24: 13 mL via INTRAVENOUS

## 2017-07-29 ENCOUNTER — Telehealth: Payer: Self-pay | Admitting: Neurology

## 2017-07-29 NOTE — Telephone Encounter (Signed)
MRi of the brain normal thanks

## 2017-07-30 NOTE — Telephone Encounter (Signed)
Called and spoke with pt about normal MRI per AA,MD. Reminded her of NP appt on 09/09/17 with AA,MD at 9am. She verbalized understanding.

## 2017-08-12 DIAGNOSIS — K5900 Constipation, unspecified: Secondary | ICD-10-CM | POA: Diagnosis not present

## 2017-08-12 DIAGNOSIS — N951 Menopausal and female climacteric states: Secondary | ICD-10-CM | POA: Diagnosis not present

## 2017-09-09 ENCOUNTER — Ambulatory Visit: Payer: BLUE CROSS/BLUE SHIELD | Admitting: Neurology

## 2017-09-09 ENCOUNTER — Encounter: Payer: Self-pay | Admitting: Neurology

## 2017-09-09 VITALS — BP 135/87 | HR 71 | Ht 62.0 in | Wt 142.4 lb

## 2017-09-09 DIAGNOSIS — G43009 Migraine without aura, not intractable, without status migrainosus: Secondary | ICD-10-CM | POA: Insufficient documentation

## 2017-09-09 DIAGNOSIS — G43011 Migraine without aura, intractable, with status migrainosus: Secondary | ICD-10-CM

## 2017-09-09 DIAGNOSIS — G43709 Chronic migraine without aura, not intractable, without status migrainosus: Secondary | ICD-10-CM | POA: Insufficient documentation

## 2017-09-09 MED ORDER — TOPIRAMATE ER 100 MG PO SPRINKLE CAP24: 100.0000 mg | EXTENDED_RELEASE_CAPSULE | Freq: Every day | ORAL | 11 refills | Status: DC

## 2017-09-09 MED ORDER — METHYLPREDNISOLONE 4 MG PO TBPK: ORAL_TABLET | ORAL | 1 refills | Status: DC

## 2017-09-09 MED ORDER — ELETRIPTAN HYDROBROMIDE 40 MG PO TABS: 40.0000 mg | ORAL_TABLET | ORAL | 11 refills | Status: DC | PRN

## 2017-09-09 NOTE — Patient Instructions (Addendum)
25mg  x 1 week, 50mg  x 1 week, 75mg  x 1 week then 100mg  (4 pills) at bedtime  Medrol dosepak  Eletriptan: Please take one tablet at the onset of your headache. If it does not improve the symptoms please take one additional tablet. Do not take more then 2 tablets in 24hrs. Do not take use more then 2 to 3 times in a week.  Stop Excedrin :)  Methylprednisolone tablets What is this medicine? METHYLPREDNISOLONE (meth ill pred NISS oh lone) is a corticosteroid. It is commonly used to treat inflammation of the skin, joints, lungs, and other organs. Common conditions treated include asthma, allergies, and arthritis. It is also used for other conditions, such as blood disorders and diseases of the adrenal glands. This medicine may be used for other purposes; ask your health care provider or pharmacist if you have questions. COMMON BRAND NAME(S): Medrol, Medrol Dosepak What should I tell my health care provider before I take this medicine? They need to know if you have any of these conditions: -Cushing's syndrome -eye disease, vision problems -diabetes -glaucoma -heart disease -high blood pressure -infection (especially a virus infection such as chickenpox, cold sores, or herpes) -liver disease -mental illness -myasthenia gravis -osteoporosis -recently received or scheduled to receive a vaccine -seizures -stomach or intestine problems -thyroid disease -an unusual or allergic reaction to lactose, methylprednisolone, other medicines, foods, dyes, or preservatives -pregnant or trying to get pregnant -breast-feeding How should I use this medicine? Take this medicine by mouth with a glass of water. Follow the directions on the prescription label. Take this medicine with food. If you are taking this medicine once a day, take it in the morning. Do not take it more often than directed. Do not suddenly stop taking your medicine because you may develop a severe reaction. Your doctor will tell you how  much medicine to take. If your doctor wants you to stop the medicine, the dose may be slowly lowered over time to avoid any side effects. Talk to your pediatrician regarding the use of this medicine in children. Special care may be needed. Overdosage: If you think you have taken too much of this medicine contact a poison control center or emergency room at once. NOTE: This medicine is only for you. Do not share this medicine with others. What if I miss a dose? If you miss a dose, take it as soon as you can. If it is almost time for your next dose, talk to your doctor or health care professional. You may need to miss a dose or take an extra dose. Do not take double or extra doses without advice. What may interact with this medicine? Do not take this medicine with any of the following medications: -alefacept -echinacea -live virus vaccines -metyrapone -mifepristone This medicine may also interact with the following medications: -amphotericin B -aspirin and aspirin-like medicines -certain antibiotics like erythromycin, clarithromycin, troleandomycin -certain medicines for diabetes -certain medicines for fungal infections like ketoconazole -certain medicines for seizures like carbamazepine, phenobarbital, phenytoin -certain medicines that treat or prevent blood clots like warfarin -cholestyramine -cyclosporine -digoxin -diuretics -female hormones, like estrogens and birth control pills -isoniazid -NSAIDs, medicines for pain inflammation, like ibuprofen or naproxen -other medicines for myasthenia gravis -rifampin -vaccines This list may not describe all possible interactions. Give your health care provider a list of all the medicines, herbs, non-prescription drugs, or dietary supplements you use. Also tell them if you smoke, drink alcohol, or use illegal drugs. Some items may interact with your medicine.  What should I watch for while using this medicine? Tell your doctor or healthcare  professional if your symptoms do not start to get better or if they get worse. Do not stop taking except on your doctor's advice. You may develop a severe reaction. Your doctor will tell you how much medicine to take. This medicine may increase your risk of getting an infection. Tell your doctor or health care professional if you are around anyone with measles or chickenpox, or if you develop sores or blisters that do not heal properly. This medicine may affect blood sugar levels. If you have diabetes, check with your doctor or health care professional before you change your diet or the dose of your diabetic medicine. Tell your doctor or health care professional right away if you have any change in your eyesight. Using this medicine for a long time may increase your risk of low bone mass. Talk to your doctor about bone health. What side effects may I notice from receiving this medicine? Side effects that you should report to your doctor or health care professional as soon as possible: -allergic reactions like skin rash, itching or hives, swelling of the face, lips, or tongue -bloody or tarry stools -changes in vision -hallucination, loss of contact with reality -muscle cramps -muscle pain -palpitations -signs and symptoms of high blood sugar such as dizziness; dry mouth; dry skin; fruity breath; nausea; stomach pain; increased hunger or thirst; increased urination -signs and symptoms of infection like fever or chills; cough; sore throat; pain or trouble passing urine -trouble passing urine or change in the amount of urine Side effects that usually do not require medical attention (report to your doctor or health care professional if they continue or are bothersome): -changes in emotions or mood -constipation -diarrhea -excessive hair growth on the face or body -headache -nausea, vomiting -trouble sleeping -weight gain This list may not describe all possible side effects. Call your doctor  for medical advice about side effects. You may report side effects to FDA at 1-800-FDA-1088. Where should I keep my medicine? Keep out of the reach of children. Store at room temperature between 20 and 25 degrees C (68 and 77 degrees F). Throw away any unused medicine after the expiration date. NOTE: This sheet is a summary. It may not cover all possible information. If you have questions about this medicine, talk to your doctor, pharmacist, or health care provider.  2018 Elsevier/Gold Standard (2015-12-01 15:53:30)  Topiramate extended-release capsules What is this medicine? TOPIRAMATE (toe PYRE a mate) is used to treat seizures in adults or children with epilepsy. It is also used for the prevention of migraine headaches. This medicine may be used for other purposes; ask your health care provider or pharmacist if you have questions. COMMON BRAND NAME(S): Trokendi XR What should I tell my health care provider before I take this medicine? They need to know if you have any of these conditions: -cirrhosis of the liver or liver disease -diarrhea -glaucoma -kidney stones or kidney disease -lung disease like asthma, obstructive pulmonary disease, emphysema -metabolic acidosis -on a ketogenic diet -scheduled for surgery or a procedure -suicidal thoughts, plans, or attempt; a previous suicide attempt by you or a family member -an unusual or allergic reaction to topiramate, other medicines, foods, dyes, or preservatives -pregnant or trying to get pregnant -breast-feeding How should I use this medicine? Take this medicine by mouth with a glass of water. Follow the directions on the prescription label. Trokendi XR capsules must be swallowed  whole. Do not sprinkle on food, break, crush, dissolve, or chew. Qudexy XR capsules may be swallowed whole or opened and sprinkled on a small amount of soft food. This mixture must be swallowed immediately. Do not chew or store mixture for later use. You may take  this medicine with meals. Take your medicine at regular intervals. Do not take it more often than directed. Talk to your pediatrician regarding the use of this medicine in children. Special care may be needed. While Trokendi XR may be prescribed for children as young as 6 years and Qudexy XR may be prescribed for children as young as 2 years for selected conditions, precautions do apply. Overdosage: If you think you have taken too much of this medicine contact a poison control center or emergency room at once. NOTE: This medicine is only for you. Do not share this medicine with others. What if I miss a dose? If you miss a dose, take it as soon as you can. If it is almost time for your next dose, take only that dose. Do not take double or extra doses. What may interact with this medicine? Do not take this medicine with any of the following medications: -probenecid This medicine may also interact with the following medications: -acetazolamide -alcohol -amitriptyline -birth control pills -digoxin -hydrochlorothiazide -lithium -medicines for pain, sleep, or muscle relaxation -metformin -methazolamide -other seizure or epilepsy medicines -pioglitazone -risperidone This list may not describe all possible interactions. Give your health care provider a list of all the medicines, herbs, non-prescription drugs, or dietary supplements you use. Also tell them if you smoke, drink alcohol, or use illegal drugs. Some items may interact with your medicine. What should I watch for while using this medicine? Visit your doctor or health care professional for regular checks on your progress. Do not stop taking this medicine suddenly. This increases the risk of seizures if you are using this medicine to control epilepsy. Wear a medical identification bracelet or chain to say you have epilepsy or seizures, and carry a card that lists all your medicines. This medicine can decrease sweating and increase your body  temperature. Watch for signs of deceased sweating or fever, especially in children. Avoid extreme heat, hot baths, and saunas. Be careful about exercising, especially in hot weather. Contact your health care provider right away if you notice a fever or decrease in sweating. You should drink plenty of fluids while taking this medicine. If you have had kidney stones in the past, this will help to reduce your chances of forming kidney stones. If you have stomach pain, with nausea or vomiting and yellowing of your eyes or skin, call your doctor immediately. You may get drowsy, dizzy, or have blurred vision. Do not drive, use machinery, or do anything that needs mental alertness until you know how this medicine affects you. To reduce dizziness, do not sit or stand up quickly, especially if you are an older patient. Alcohol can increase drowsiness and dizziness. Avoid alcoholic drinks. Do not drink alcohol for 6 hours before or 6 hours after taking Trokendi XR. If you notice blurred vision, eye pain, or other eye problems, seek medical attention at once for an eye exam. The use of this medicine may increase the chance of suicidal thoughts or actions. Pay special attention to how you are responding while on this medicine. Any worsening of mood, or thoughts of suicide or dying should be reported to your health care professional right away. This medicine may increase the chance of  developing metabolic acidosis. If left untreated, this can cause kidney stones, bone disease, or slowed growth in children. Symptoms include breathing fast, fatigue, loss of appetite, irregular heartbeat, or loss of consciousness. Call your doctor immediately if you experience any of these side effects. Also, tell your doctor about any surgery you plan on having while taking this medicine since this may increase your risk for metabolic acidosis. Birth control pills may not work properly while you are taking this medicine. Talk to your doctor  about using an extra method of birth control. Women who become pregnant while using this medicine may enroll in the [place] Pregnancy Registry by calling [phone]. This registry collects information about the safety of antiepileptic drug use during pregnancy. What side effects may I notice from receiving this medicine? Side effects that you should report to your doctor or health care professional as soon as possible: -allergic reactions like skin rash, itching or hives, swelling of the face, lips, or tongue -decreased sweating and/or rise in body temperature -depression -difficulty breathing, fast or irregular breathing patterns -difficulty speaking -difficulty walking or controlling muscle movements -hearing impairment -redness, blistering, peeling or loosening of the skin, including inside the mouth -tingling, pain or numbness in the hands or feet -unusually weak or tired -worsening of mood, thoughts or actions of suicide or dying Side effects that usually do not require medical attention (report to your doctor or health care professional if they continue or are bothersome): -altered taste -back pain, joint or muscle aches and pains -diarrhea, or constipation -headache -loss of appetite -nausea -stomach upset, indigestion -tremors This list may not describe all possible side effects. Call your doctor for medical advice about side effects. You may report side effects to FDA at 1-800-FDA-1088. Where should I keep my medicine? Keep out of the reach of children. Store at room temperature between 15 and 30 degrees C (59 and 86 degrees F) in a tightly closed container. Protect from moisture. Throw away any unused medicine after the expiration date. NOTE: This sheet is a summary. It may not cover all possible information. If you have questions about this medicine, talk to your doctor, pharmacist, or health care provider.  2018 Elsevier/Gold Standard  (2016-01-13 12:33:11)

## 2017-09-09 NOTE — Progress Notes (Addendum)
[facility]    Provider:  Dr Jaynee Eagles Referring Provider: Jonathon Jordan, MD Primary Care Physician:  Jonathon Jordan, MD  CC:  Migraines  Addendum Patient had hair loss with topiramate. Doesn't want to try Zonegran bc it has similar side effects. Medications tried: Sumatriptan, zolmitriptan, nortriptyline, tizanidine, Keppra (hallucinations and suicidal ideation), excedrin migraine, advil. Amerge, Topamx, maxalt, steroids   Medications tried: Sumatriptan, zolmitriptan, nortriptyline, tizanidine, Keppra (hallucinations and suicidal ideation), excedrin migraine, advil. Amerge, Topamx, maxalt, steroids, Topiramate (allopecia), Zonegran (doesn't want to try bc may also have allopecia).   HPI:  Stephanie Hancock is a 51 y.o. female here as a referral from Dr. Stephanie Acre for migraines.  Past medical history of asthma, migraines, anemia, uterine fibroids, rhinitis, H. pylori, vitamin D deficiency. Mother with migraines. Daughter with migraines. She has had migraines since the age of 64. She used to get an aura, "bouncy orbs or black and white lines", no auras now. She takes excedrin excessively, +overuse. No aura. Daily headaches, she has 14 migrainous a month for years and can last all day. If she didn't treat she would have daily migraines. They can be unilateral, pounding, throbbing, behind the eye, pain in the left temple, nausea, vomiting, light and sound sensitivity. Lights aggravate. Sitting still helps in a dark room. Weather changes with aggravate her migraines.   Medications tried: Sumatriptan, zolmitriptan, nortriptyline, tizanidine, Keppra (hallucinations and suicidal ideation), excedrin migraine, advil. Amerge, Topamx, maxalt, steroids   Reviewed notes, labs and imaging from outside physicians, which showed:  MRI brain 07/2017 showed No acute intracranial abnormalities including mass lesion or mass effect, hydrocephalus, extra-axial fluid collection, midline shift, hemorrhage,  or acute infarction, large ischemic events (personally reviewed images)  CBC with anemia 11.2/33.5, BMP elevated glusoce. 06/2017.  Medications tried: Sumatriptan, zolmitriptan, nortriptyline, tizanidine.  Patient was being seen by the [facility] and he retired.  Review of Systems: Patient complains of symptoms per HPI as well as the following symptoms: confusion with migraine, headache, insomnia. Pertinent negatives and positives per HPI. All others negative.   Social History   Socioeconomic History  . Marital status: Married    Spouse name: Not on file  . Number of children: Not on file  . Years of education: Not on file  . Highest education level: Not on file  Social Needs  . Financial resource strain: Not on file  . Food insecurity - worry: Not on file  . Food insecurity - inability: Not on file  . Transportation needs - medical: Not on file  . Transportation needs - non-medical: Not on file  Occupational History  . Not on file  Tobacco Use  . Smoking status: Never Smoker  . Smokeless tobacco: Never Used  Substance and Sexual Activity  . Alcohol use: Yes    Alcohol/week: 1.2 - 2.4 oz    Types: 2 - 4 Cans of beer per week    Comment: 1-2 beers per day  . Drug use: No  . Sexual activity: Yes    Birth control/protection: None  Other Topics Concern  . Not on file  Social History Narrative  . Not on file    Family History  Problem Relation Age of Onset  . Hypertension Mother   . Diabetes Mother   . Osteoporosis Mother   . Hypertension Father   . Prostate cancer Father   . Heart attack Father   . Kidney cancer Sister     Past Medical History:  Diagnosis Date  . Abnormal uterine  bleeding   . Allergic rhinitis   . Anemia   . Asthma   . Depression    hx of  . Fibroid   . History of esophagogastroduodenoscopy (EGD) 2016   H pylori +  . Hypercholesterolemia   . Migraine    with aura  . Patient is Jehovah's Witness   . Vitamin D deficiency      Past Surgical History:  Procedure Laterality Date  . ROBOTIC ASSISTED TOTAL HYSTERECTOMY Bilateral 06/11/2017   Procedure: XI ROBOTIC ASSISTED TOTAL HYSTERECTOMY BILATERAL SALPINGECTOMY, RIGHT OOPHERECTOMY,  LYSIS OF ADHESIONS ,MINI LAPAROTOMY;  Surgeon: Everitt Amber, MD;  Location: [facility];  Service: Gynecology;  Laterality: Bilateral;  . TUBAL LIGATION     then reversal  . UTERINE FIBROID SURGERY      Current Outpatient Medications  Medication Sig Dispense Refill  . albuterol (PROVENTIL HFA;VENTOLIN HFA) 108 (90 BASE) MCG/ACT inhaler Inhale 1-2 puffs into the lungs every 6 (six) hours as needed for wheezing or shortness of breath.    Marland Kitchen albuterol (PROVENTIL) (5 MG/ML) 0.5% nebulizer solution Take 2.5 mg by nebulization every 6 (six) hours as needed for wheezing or shortness of breath.    Marland Kitchen aspirin-acetaminophen-caffeine (EXCEDRIN MIGRAINE) 250-250-65 MG tablet Take 3 tablets by mouth 3 (three) times daily as needed for headache.     . ferrous gluconate (FERGON) 225 (27 Fe) MG tablet Take 1 tablet (240 mg total) by mouth 3 (three) times daily with meals. 90 each 3  . fluticasone-salmeterol (ADVAIR HFA) 45-21 MCG/ACT inhaler Inhale 2 puffs into the lungs 2 (two) times daily.    Marland Kitchen ibuprofen (ADVIL,MOTRIN) 200 MG tablet Take 400 mg by mouth every 6 (six) hours as needed for moderate pain.    Marland Kitchen ketoprofen (ORUDIS) 75 MG capsule Take 75 mg by mouth 3 (three) times daily.    Marland Kitchen senna (SENOKOT) 8.6 MG TABS tablet Take 1 tablet (8.6 mg total) by mouth at bedtime. 120 each 0  . eletriptan (RELPAX) 40 MG tablet Take 1 tablet (40 mg total) by mouth as needed for migraine or headache. May repeat in 2 hours if headache persists or recurs. 10 tablet 11  . methylPREDNISolone (MEDROL DOSEPAK) 4 MG TBPK tablet Take pills in the morning with food for 6 days 21 tablet 1  . Topiramate ER (QUDEXY XR) 100 MG CS24 sprinkle capsule Take 100 mg by mouth at bedtime. 30 each 11   No current facility-administered  medications for this visit.     Allergies as of 09/09/2017 - Review Complete 09/09/2017  Allergen Reaction Noted  . Codeine Hives 09/25/2015  . Kiwi extract Swelling and Other (See Comments) 03/29/2017  . Levetiracetam Other (See Comments) 05/30/2017  . Other  06/03/2017  . Powder Itching and Other (See Comments) 05/30/2017    Vitals: BP 135/87   Pulse 71   Ht 5\' 2"  (1.575 m)   Wt 142 lb 6.4 oz (64.6 kg)   LMP 03/29/2017   BMI 26.05 kg/m  Last Weight:  Wt Readings from Last 1 Encounters:  09/09/17 142 lb 6.4 oz (64.6 kg)   Last Height:   Ht Readings from Last 1 Encounters:  09/09/17 5\' 2"  (1.575 m)    Physical exam: Exam: Gen: NAD, conversant, well nourised, well groomed                     CV: RRR, no MRG. No Carotid Bruits. No peripheral edema, warm, nontender Eyes: Conjunctivae clear without exudates or hemorrhage  Neuro:  Detailed Neurologic Exam  Speech:    Speech is normal; fluent and spontaneous with normal comprehension.  Cognition:    The patient is oriented to person, place, and time;     recent and remote memory intact;     language fluent;     normal attention, concentration,     fund of knowledge Cranial Nerves:    The pupils are equal, round, and reactive to light. The fundi are normal and spontaneous venous pulsations are present. Visual fields are full to finger confrontation. Extraocular movements are intact. Trigeminal sensation is intact and the muscles of mastication are normal. The face is symmetric. The palate elevates in the midline. Hearing intact. Voice is normal. Shoulder shrug is normal. The tongue has normal motion without fasciculations.   Coordination:    Normal finger to nose and heel to shin. Normal rapid alternating movements.   Gait:    Heel-toe and tandem gait are normal.   Motor Observation:    No asymmetry, no atrophy, and no involuntary movements noted. Tone:    Normal muscle tone.    Posture:    Posture is normal.  normal erect    Strength:    Strength is V/V in the upper and lower limbs.      Sensation: intact to LT     Reflex Exam:  DTR's:    Deep tendon reflexes in the upper and lower extremities are normal bilaterally.   Toes:    The toes are downgoing bilaterally.   Clonus:    Clonus is absent.   Assessment/Plan:  51 year old with chronic intractable migraines with excedrin overuse.   Medications tried: Sumatriptan, zolmitriptan, nortriptyline, tizanidine, Keppra (hallucinations and suicidal ideation), excedrin migraine, advil. Amerge, Topamx, maxalt, steroids, Topiramate (allopecia), Zonegran (doesn't want to try bc may also have allopecia).  Preventative: recommend Qudexy. Will titrate to 100mg . Discussed side effects.   She declines botox and CGRP injections due to fear of needles   Rizatriptan: Please take one tablet at the onset of your headache. If it does not improve the symptoms please take one additional tablet. Do not take more then 2 tablets in 24hrs. Do not take use more then 2 to 3 times in a week.  Medrol dosepak  Discussed clinical trial for oral CGRP antagonist   To prevent or relieve headaches, try the following: Cool Compress. Lie down and place a cool compress on your head.  Avoid headache triggers. If certain foods or odors seem to have triggered your migraines in the past, avoid them. A headache diary might help you identify triggers.  Include physical activity in your daily routine. Try a daily walk or other moderate aerobic exercise.  Manage stress. Find healthy ways to cope with the stressors, such as delegating tasks on your to-do list.  Practice relaxation techniques. Try deep breathing, yoga, massage and visualization.  Eat regularly. Eating regularly scheduled meals and maintaining a healthy diet might help prevent headaches. Also, drink plenty of fluids.  Follow a regular sleep schedule. Sleep deprivation might contribute to headaches Consider  biofeedback. With this mind-body technique, you learn to control certain bodily functions - such as muscle tension, heart rate and blood pressure - to prevent headaches or reduce headache pain.    Proceed to emergency room if you experience new or worsening symptoms or symptoms do not resolve, if you have new neurologic symptoms or if headache is severe, or for any concerning symptom.   Provided education and documentation from [employer] headache  Society toolbox including articles on: chronic migraine medication overuse headache, chronic migraines, prevention of migraines, behavioral and other nonpharmacologic treatments for headache.   Sarina Ill, MD  [facility] [address] [place], [place] [address]  Phone [phone] Fax [phone]

## 2017-10-02 ENCOUNTER — Telehealth: Payer: Self-pay | Admitting: Neurology

## 2017-10-02 NOTE — Telephone Encounter (Signed)
I called pt. She reports hair loss while taking qudexy. Pt has not noticed much improvement in her migraine frequency while taking qudexy, she is still having weekly migraines. Pt wants to stop the qudexy and is wondering if there is another preventative that Dr. Jaynee Eagles recommends. Pt has declined botox in the past.

## 2017-10-02 NOTE — Telephone Encounter (Signed)
Pt calling re: the Topiramate ER (QUDEXY XR) 100 MG CS24 sprinkle capsule, she states this is causing her hair to thin and this has her very concerned, please call

## 2017-10-03 MED ORDER — PROPRANOLOL HCL 80 MG PO TABS: 80.0000 mg | ORAL_TABLET | Freq: Every day | ORAL | 0 refills | Status: DC

## 2017-10-03 NOTE — Addendum Note (Signed)
Addended by: Lester [place] A on: 10/03/2017 12:17 PM   Modules accepted: Orders

## 2017-10-03 NOTE — Telephone Encounter (Signed)
Doesn't appear she has ever tried propranol which is a good first-line migraine medication. Will have to start slowly, watch for dizziness, weakness, hypotension and bradycardia.  propranolol side effects may include:nausea, vomiting, diarrhea, constipation, stomach cramps; or tired feeling. Do NOT get pregnant on this medication. Stop immediately for: slow or uneven heartbeats;a light-headed feeling, like you might pass out;wheezing or trouble breathing;shortness of breath (even with mild exertion), swelling,  Or any other significant side effects (this is not a complete list).  I would start Inderal 80mg  at bedtime.

## 2017-10-03 NOTE — Telephone Encounter (Signed)
I called pt. I advised her that Dr. Jaynee Eagles recommends starting propranolol, which is a good first-line medication, she will start pt slowly and pt should watch for dizziness, weakness, hypotension, and bradycardia. I also explained the other side effects and asked that she stop taking propranolol immediately for slow or uneven heartbeats, a light-headed feeling, fainting, wheezing, SoB,  Swelling, or any significant side effects. I did advises her that tis is not a complete list of side effects but to talk to her pharmacist if she has more questions. Pt is agreeable to this and asked that the RX be sent to [facility] on [address]. Pt verbalized understanding of the recommendations. Order for propranolol placed.

## 2017-10-11 ENCOUNTER — Encounter: Payer: Self-pay | Admitting: Neurology

## 2017-10-18 ENCOUNTER — Encounter: Payer: Self-pay | Admitting: Neurology

## 2017-10-21 ENCOUNTER — Telehealth: Payer: Self-pay | Admitting: Neurology

## 2017-10-21 ENCOUNTER — Other Ambulatory Visit: Payer: Self-pay | Admitting: Neurology

## 2017-10-21 MED ORDER — ERENUMAB-AOOE 70 MG/ML ~~LOC~~ SOAJ: 70.0000 mg | SUBCUTANEOUS | 11 refills | Status: DC

## 2017-10-21 NOTE — Telephone Encounter (Signed)
Patient requested Aimovig. Would you call and let her know that I called it in. The major side effects are injection-site reaction, muscle cramps and constipation . Do not take with latex allergy. If she has a copay card she may get it for $0 even if insurance declines. She can come get a copay card if needed. It may take 4-6 months for max effectiveness so she will have to wait and see how it does for her before she switch anything around again, however patients reported improvement as early as the first month. thanks

## 2017-10-22 NOTE — Telephone Encounter (Signed)
Called and spoke with patient. She is aware that Dr. Jaynee Eagles sent in the [place] to her pharmacy. She states she has already been notified by pharmacy and they were having trouble with her insurance. I advised her to go to [email or web address] and registered for the $0 copay card and then take that to her pharmacy. She should be able to get it free until the end of the year. She verbalized understanding. I also discussed the side effects including injection-site reaction, muscle cramps, and constipation. I stated not to take it if she has a latex allergy. Lastly, we discussed that it may take 4-6 months for max effectiveness so she will have to wait and see how it does for her before we switch anything around again; however patients have reported improvement as early as the first month. She verbalized understanding and appreciation. She has a follow-up appt in June and will call or email in the meantime with any questions.

## 2017-10-24 ENCOUNTER — Telehealth: Payer: Self-pay | Admitting: Neurology

## 2017-10-24 NOTE — Telephone Encounter (Addendum)
Called patient and inquired on length of time of the following drugs:   Keppra ( at least 2 months taken then had side effects of hallucinations & suicidal ideation) Nortriptyline (3 months taken)  She denies any prior use of Beta blocker other than recent Propranolol (about a month).  Completed PA on Cover My Meds   (Key: [id number])   Your information has been submitted to [place]. [employer] [place] will review the request and fax you a determination directly, typically within 3 business days of your submission once all necessary information is received. If [employer] [place] has not responded in 3 business days or if you have any questions about your submission, contact [place] at [phone].

## 2017-10-24 NOTE — Telephone Encounter (Signed)
Pt calling so that Dr Jaynee Eagles can be made aware that in spite of pt going to the suggested website by Dr Jaynee Eagles for the [facility] 55 MG/ML SOAJ her insurance has still prevented her from getting the medication and pt would like a call back as to what to do now

## 2017-10-25 ENCOUNTER — Encounter: Payer: Self-pay | Admitting: *Deleted

## 2017-10-25 MED ORDER — FREMANEZUMAB-VFRM 225 MG/1.5ML ~~LOC~~ SOSY: 1.0000 | PREFILLED_SYRINGE | SUBCUTANEOUS | 11 refills | Status: DC

## 2017-10-25 NOTE — Telephone Encounter (Signed)
Dr. Jaynee Eagles aware and suggests Ajovy if patient would like to switch.  Called and spoke with patient. She is aware that Aimovig has been denied. Discussed alternative CGRP medication Ajovy, also once monthly injectable. She would like to try this. I placed the order for Ajovy and discontinued the Aimovig.

## 2017-10-25 NOTE — Addendum Note (Signed)
Addended by: Gildardo Griffes on: 10/25/2017 11:08 AM   Modules accepted: Orders

## 2017-10-25 NOTE — Telephone Encounter (Signed)
Aimovig denied. No reason given on Cover My Meds.

## 2017-10-29 ENCOUNTER — Telehealth: Payer: Self-pay | Admitting: *Deleted

## 2017-10-29 NOTE — Telephone Encounter (Signed)
Ajovy PA completed in Cover My Meds  (Key: [id number])   Determination estimated within next 3 business days.

## 2017-10-30 NOTE — Telephone Encounter (Signed)
Received notification that Stephanie Hancock has been denied due to the following reasons:  The request does not meet the definition of Medical Necessity found in the member's benefit booklet. The medication is not being used to treat chronic migraine.    I informed the patient the other day that the savings card would allow her to get the Ajovy free even if insurance denies. I advised to come and pick up a savings card at the office.

## 2017-11-05 ENCOUNTER — Telehealth: Payer: Self-pay | Admitting: *Deleted

## 2017-11-05 NOTE — Telephone Encounter (Signed)
Patient came to office. She was given the Ajovy savings card. Patient also inquired about getting a copy of her records that we received from Dr. Ovid Curd office. Hilda Blades looking into this.

## 2017-11-06 ENCOUNTER — Other Ambulatory Visit: Payer: Self-pay | Admitting: Neurology

## 2017-11-06 NOTE — Telephone Encounter (Signed)
Received refill request from pharmacy for Propranolol. Called patient to verify. She stated she is no longer taking it. Refused refill request and included note to pharmacy that pt no longer taking medication.

## 2017-11-11 ENCOUNTER — Encounter: Payer: Self-pay | Admitting: Neurology

## 2017-11-17 ENCOUNTER — Telehealth: Payer: Self-pay | Admitting: Neurology

## 2017-11-17 ENCOUNTER — Other Ambulatory Visit: Payer: Self-pay | Admitting: Neurology

## 2017-11-17 MED ORDER — SUMATRIPTAN SUCCINATE 6 MG/0.5ML ~~LOC~~ SOLN: SUBCUTANEOUS | 1 refills | Status: DC

## 2017-11-17 NOTE — Telephone Encounter (Signed)
Stephanie Hancock, I am going to start patient on Ajovy or Aimovig (whichever we have samples of) and Qudexy 50mg  a day. I told her to come by and we can show her how to use the injection and give her samples of qudexy as well as copay cards for both the qudexy and ajovy or aimovig. Also may try a different triptan. Can you give her a call and arrange? I don;t think an appointment is necessary. thanks

## 2017-11-18 NOTE — Telephone Encounter (Addendum)
Patient already was denied Aimovig & Ajovy. Pt was given Ajovy card. Patient had stated it did not work. Dr. Jaynee Eagles aware. Will call pt's pharmacy as the copay card should work regardless of insurance denial.   Ship broker and spoke with [employer]. RN Informed him that Ajovy card should be accepted since pt has Pharmacist, community. Copay should be $0. He ran the card information and was successful. Ajovy will be $0 and the pharmacy will contact the patient when it is ready.   Dr. Jaynee Eagles aware. For now per MD, pt can get started on Ajovy and see how she does. Will email patient and let her  Know.

## 2017-11-25 NOTE — Telephone Encounter (Signed)
Tonya from [facility] states that on the 15th a fax was sent here re: PA being needed, she is asking for a call back in response to if the fax was received

## 2017-11-27 NOTE — Telephone Encounter (Signed)
Spoke with Hydrologist. She stated that a claim was already filed even though pt did not pickup Ajovy. They were able to get the copay card to work again. Pt is due to pickup Ajovy on 3/5 but pharmacy will call and see if claim can be retracted since pt did not pickup the last dose.

## 2017-11-28 NOTE — Telephone Encounter (Signed)
Spoke with patient. She is going to [facility] today to pickup her Ajovy. She was told it was going to be $699. Again assured patient that I was told that the copay card worked per pharmacy and that it should be $0 copay or very close to that. Patient was very appreciative and will take the card and the instructions to the pharmacy in case there are any issues at pickup. Encouraged patient to call with an update and she verbalized understanding and appreciation.

## 2018-01-07 NOTE — Telephone Encounter (Signed)
Spoke with patient. She is being told that insurance will not cover. However, she has already given the pharmacy the copay card. Informed pt that RN would call the pharmacy.   Called pt's pharmacy and discussed the [place]. The insurance was being billed instead of the copay card. Issue resolved and pt will be able to pickup the medication for free.   Called pt and was informed of resolution of the issue with the Ajovy. She will get it today for free. She was very Patent attorney. Also clarified pt's pharmacy and removed the [place] location as pt no longer uses that one.

## 2018-01-07 NOTE — Telephone Encounter (Signed)
Patient tried to reorder her second Ajovy injection from [facility] on [address].and having problems like before. Please call and discuss.

## 2018-02-13 NOTE — Telephone Encounter (Signed)
Received another PA request for Ajovy. Called [facility] and clarified that patient has been successfully getting her Ajovy monthly with the copay card. Her last pickup was 02/07/18.  He stated that they are required to run the Ajovy through the pt's insurance first at every pick up and then when they get the rejection they can use the copay card. This likely will generate a PA every time. However, pt continues to be able to get the medication free with the card.   PA will not be repeated at this time.

## 2018-02-24 ENCOUNTER — Telehealth: Payer: Self-pay | Admitting: Neurology

## 2018-02-24 NOTE — Telephone Encounter (Signed)
[name] requesting a call back to discuss a PA for Ajovy. Lilyan Punt can be reached at [phone] key reference [id number]

## 2018-02-25 NOTE — Telephone Encounter (Signed)
Spoke with Adam @ Cover My Meds. He is aware that pt is currently getting Ajovy with copay card. No PA is being repeated at this time. Previous PA denied.

## 2018-03-10 ENCOUNTER — Ambulatory Visit: Payer: BLUE CROSS/BLUE SHIELD | Admitting: Adult Health

## 2018-03-11 ENCOUNTER — Encounter: Payer: Self-pay | Admitting: Adult Health

## 2018-05-16 DIAGNOSIS — E7800 Pure hypercholesterolemia, unspecified: Secondary | ICD-10-CM | POA: Diagnosis not present

## 2018-05-16 DIAGNOSIS — Z0000 Encounter for general adult medical examination without abnormal findings: Secondary | ICD-10-CM | POA: Diagnosis not present

## 2018-05-16 DIAGNOSIS — E559 Vitamin D deficiency, unspecified: Secondary | ICD-10-CM | POA: Diagnosis not present

## 2018-06-06 DIAGNOSIS — Z1231 Encounter for screening mammogram for malignant neoplasm of breast: Secondary | ICD-10-CM | POA: Diagnosis not present

## 2018-06-10 NOTE — Progress Notes (Signed)
52 y.o. [id number] Married [place] American female here for annual exam.    Has robotic hysterectomy, bilateral salpingectomy, and right oophorectomy for uterine fibroids one year ago.  Had a post op ileus.   Having hot flashes. States she is able to deal with this.  Night sweats. Taking an herbal remedy.  Having depression and fatigue.  Had an episode of feeling suicidal and felt relieved her husband was with her at the time.  She states that she was so fatigued that she felt like she could not take it anymore.  Has used a medication in the past for migraines and states she felt suicidal then, so her medication was changed.  States she is much improved now.   Labs with PCP on May 16, 2018.  PCP: Dr. Jonathon Jordan    Patient's last menstrual period was 03/29/2017.           Sexually active: Yes.    The current method of family planning is status post hysterectomy.    Exercising: Yes.    The patient has a physically strenuous job, but has no regular exercise apart from work.  Smoker:  no  Health Maintenance: Pap:  02/2016 at [facility] -- normal per patient History of abnormal Pap:  no MMG:  06/06/2018 - [facility] - BI-RADS 1, cat B density. Colonoscopy:  06/22/15 f/u 10 years BMD:   n/a  Result  n/a TDaP:  UTD per patient Gardasil:   n/a HIV and Hep C: possibly done with PCP Screening Labs:  PCP   reports that she has never smoked. She has never used smokeless tobacco. She reports that she drinks about 2.0 - 4.0 standard drinks of alcohol per week. She reports that she does not use drugs.  Past Medical History:  Diagnosis Date  . Abnormal uterine bleeding   . Allergic rhinitis   . Anemia   . Asthma   . Depression    hx of  . Fibroid   . History of esophagogastroduodenoscopy (EGD) 2016   H pylori +  . Hypercholesterolemia   . Migraine    with aura  . Patient is Jehovah's Witness   . Vitamin D deficiency     Past Surgical History:  Procedure Laterality Date  .  ROBOTIC ASSISTED TOTAL HYSTERECTOMY Bilateral 06/11/2017   Procedure: XI ROBOTIC ASSISTED TOTAL HYSTERECTOMY BILATERAL SALPINGECTOMY, RIGHT OOPHERECTOMY,  LYSIS OF ADHESIONS ,MINI LAPAROTOMY;  Surgeon: Everitt Amber, MD;  Location: [facility];  Service: Gynecology;  Laterality: Bilateral;  . TUBAL LIGATION     then reversal  . UTERINE FIBROID SURGERY      Current Outpatient Medications  Medication Sig Dispense Refill  . albuterol (PROVENTIL HFA;VENTOLIN HFA) 108 (90 BASE) MCG/ACT inhaler Inhale 1-2 puffs into the lungs every 6 (six) hours as needed for wheezing or shortness of breath.    Marland Kitchen albuterol (PROVENTIL) (5 MG/ML) 0.5% nebulizer solution Take 2.5 mg by nebulization every 6 (six) hours as needed for wheezing or shortness of breath.    Marland Kitchen aspirin-acetaminophen-caffeine (EXCEDRIN MIGRAINE) 250-250-65 MG tablet Take 3 tablets by mouth 3 (three) times daily as needed for headache.     . eletriptan (RELPAX) 40 MG tablet Take 1 tablet (40 mg total) by mouth as needed for migraine or headache. May repeat in 2 hours if headache persists or recurs. 10 tablet 11  . ferrous gluconate (FERGON) 225 (27 Fe) MG tablet Take 1 tablet (240 mg total) by mouth 3 (three) times daily with meals. 90 each 3  .  fluticasone-salmeterol (ADVAIR HFA) 45-21 MCG/ACT inhaler Inhale 2 puffs into the lungs 2 (two) times daily.    . Fremanezumab-vfrm (AJOVY) 225 MG/1.5ML SOSY Inject 1 Syringe into the skin every 30 (thirty) days. 1 Syringe 11  . ibuprofen (ADVIL,MOTRIN) 200 MG tablet Take 400 mg by mouth every 6 (six) hours as needed for moderate pain.    Marland Kitchen ketoprofen (ORUDIS) 75 MG capsule Take 75 mg by mouth 3 (three) times daily.    . methylPREDNISolone (MEDROL DOSEPAK) 4 MG TBPK tablet Take pills in the morning with food for 6 days 21 tablet 1  . propranolol (INDERAL) 80 MG tablet Take 1 tablet (80 mg total) by mouth at bedtime. 30 tablet 0  . senna (SENOKOT) 8.6 MG TABS tablet Take 1 tablet (8.6 mg total) by mouth at bedtime.  120 each 0  . SUMAtriptan (IMITREX) 6 MG/0.5ML SOLN injection Take one dose at headache onset, can take additional dose 2hrs later if needed. No more then 2 injections in 24hrs 10 vial 1  . Topiramate ER (QUDEXY XR) 100 MG CS24 sprinkle capsule Take 100 mg by mouth at bedtime. 30 each 11   No current facility-administered medications for this visit.     Family History  Problem Relation Age of Onset  . Hypertension Mother   . Diabetes Mother   . Osteoporosis Mother   . Hypertension Father   . Prostate cancer Father   . Heart attack Father   . Kidney cancer Sister     Review of Systems  Endocrine: Positive for heat intolerance.  Neurological: Positive for headaches.  Psychiatric/Behavioral:       Depression  All other systems reviewed and are negative.   Exam:   BP 116/70 (BP Location: Right Arm, Patient Position: Sitting, Cuff Size: Normal)   Pulse 72   Resp 16   Ht 5\' 1"  (1.549 m)   Wt 141 lb (64 kg)   LMP 03/29/2017   BMI 26.64 kg/m     General appearance: alert, cooperative and appears stated age Head: Normocephalic, without obvious abnormality, atraumatic Neck: no adenopathy, supple, symmetrical, trachea midline and thyroid normal to inspection and palpation Lungs: clear to auscultation bilaterally Breasts: normal appearance, no masses or tenderness, No nipple retraction or dimpling, No nipple discharge or bleeding, No axillary or supraclavicular adenopathy Heart: regular rate and rhythm Abdomen: soft, non-tender; no masses, no organomegaly Extremities: extremities normal, atraumatic, no cyanosis or edema Skin: Skin color, texture, turgor normal. No rashes or lesions Lymph nodes: Cervical, supraclavicular, and axillary nodes normal. No abnormal inguinal nodes palpated Neurologic: Grossly normal  Pelvic: External genitalia:  no lesions              Urethra:  normal appearing urethra with no masses, tenderness or lesions              Bartholins and Skenes: normal                  Vagina: normal appearing vagina with normal color and discharge, no lesions              Cervix:  absent              Pap taken: No. Bimanual Exam:  Uterus:   absent              Adnexa: no mass, fullness, tenderness              Rectal exam: Yes.  .  Confirms.  Anus:  normal sphincter tone, no lesions  Chaperone was present for exam.  Assessment:   Well woman visit with normal exam. Status post robotic hysterectomy, bilateral salpingectomy, right oophorectomy 06/11/17.  Menopausal symptoms.  Depression with suicidal ideation.  Migraine with aura.  Asthma.   Plan: Mammogram screening.  Will get copy of report.  Recommended self breast awareness. Pap and HR HPV as above. Guidelines for Calcium, Vitamin D, regular exercise program including cardiovascular and weight bearing exercise. Check FSH and E2.  Start Vivelle Dot 0.05 mg twice weekly.  Discussed potential increased risk of stroke, PE, and DVT.  Will facilitate appt with PCP regarding depression. Follow up annually and prn.   After visit summary provided.

## 2018-06-11 ENCOUNTER — Other Ambulatory Visit: Payer: Self-pay

## 2018-06-11 ENCOUNTER — Encounter: Payer: Self-pay | Admitting: Obstetrics and Gynecology

## 2018-06-11 ENCOUNTER — Ambulatory Visit (INDEPENDENT_AMBULATORY_CARE_PROVIDER_SITE_OTHER): Payer: BLUE CROSS/BLUE SHIELD | Admitting: Obstetrics and Gynecology

## 2018-06-11 VITALS — BP 116/70 | HR 72 | Resp 16 | Ht 61.0 in | Wt 141.0 lb

## 2018-06-11 DIAGNOSIS — Z01419 Encounter for gynecological examination (general) (routine) without abnormal findings: Secondary | ICD-10-CM

## 2018-06-11 DIAGNOSIS — N951 Menopausal and female climacteric states: Secondary | ICD-10-CM | POA: Diagnosis not present

## 2018-06-11 MED ORDER — ESTRADIOL 0.05 MG/24HR TD PTTW: 1.0000 | MEDICATED_PATCH | TRANSDERMAL | 12 refills | Status: DC

## 2018-06-11 NOTE — Patient Instructions (Signed)

## 2018-06-11 NOTE — Progress Notes (Signed)
Patient scheduled while in office with PCP/ Dr. Stephanie Acre for follow-up of depression with suicidal ideations on 06/12/18 at 10:45am. Patient verbalizes understanding and is agreeable.   OV notes faxed to [facility] at [phone]

## 2018-06-12 LAB — ESTRADIOL: Estradiol: <5 pg/mL

## 2018-06-12 LAB — FOLLICLE STIMULATING HORMONE: FSH: 81.2 m[IU]/mL

## 2018-06-18 ENCOUNTER — Telehealth: Payer: Self-pay | Admitting: Emergency Medicine

## 2018-06-18 NOTE — Telephone Encounter (Signed)
-----   Message from Nunzio Cobbs, MD sent at 06/15/2018  9:50 AM EDT ----- Please report results of labs confirming menopause.  She was to start transdermal estrogen. She also had an appointment with her PCP regarding depression.  I would like feedback about that appointment.  I recommend she see me for a follow up visit in 3 months to monitor her menopausal treatment.  Please schedule this.

## 2018-06-18 NOTE — Telephone Encounter (Signed)
Spoke with patient. Results given.  Patient has been using the patch and states "waiting for it to kick in, I'm giving it 6 weeks like Dr. Quincy Simmonds said."  Has appointment with Dr. Stephanie Acre tomorrow, will let us know if she does not keep appointment.  Follow up appointment with Dr. Quincy Simmonds on 07/23/2018. Pt will call back with any questions or concerns.  Okay to close?

## 2018-06-18 NOTE — Telephone Encounter (Signed)
Thank you for the follow up.  Encounter closed.

## 2018-06-19 ENCOUNTER — Telehealth: Payer: Self-pay | Admitting: Obstetrics and Gynecology

## 2018-06-19 NOTE — Telephone Encounter (Signed)
Patient calling to let Dr. Quincy Simmonds know she had an appointment with her PCP this morning as recommended, but had to leave due to long wait.

## 2018-06-19 NOTE — Telephone Encounter (Signed)
Encounter reviewed and closed.  

## 2018-06-19 NOTE — Telephone Encounter (Signed)
Spoke with patient. Patient states she has not rescheduled her appointment with PCP Patient states she would like to discuss with her husband possibly changing PCP providers. Provided options of PCP in area accepting new patients. Patient states she will return call to office to provide update for Dr. Quincy Simmonds. Advised to return call to office if assistance is needed with scheduling. Patient verbalizes understanding.   Routing to Dr. Antony Blackbird

## 2018-07-23 ENCOUNTER — Ambulatory Visit (INDEPENDENT_AMBULATORY_CARE_PROVIDER_SITE_OTHER): Payer: Medicare Other | Admitting: Obstetrics and Gynecology

## 2018-07-23 ENCOUNTER — Other Ambulatory Visit: Payer: Self-pay

## 2018-07-23 ENCOUNTER — Encounter: Payer: Self-pay | Admitting: Obstetrics and Gynecology

## 2018-07-23 VITALS — BP 130/74 | HR 70 | Ht 61.0 in | Wt 148.0 lb

## 2018-07-23 DIAGNOSIS — F329 Major depressive disorder, single episode, unspecified: Secondary | ICD-10-CM

## 2018-07-23 DIAGNOSIS — R5383 Other fatigue: Secondary | ICD-10-CM | POA: Diagnosis not present

## 2018-07-23 DIAGNOSIS — N951 Menopausal and female climacteric states: Secondary | ICD-10-CM

## 2018-07-23 DIAGNOSIS — F32A Depression, unspecified: Secondary | ICD-10-CM

## 2018-07-23 DIAGNOSIS — R443 Hallucinations, unspecified: Secondary | ICD-10-CM

## 2018-07-23 MED ORDER — ESTRADIOL 0.1 MG/24HR TD PTTW: 1.0000 | MEDICATED_PATCH | TRANSDERMAL | 3 refills | Status: DC

## 2018-07-23 NOTE — Progress Notes (Signed)
GYNECOLOGY  VISIT   HPI: 52 y.o.   Married  [place] American  female   [phone] with Patient's last menstrual period was 03/29/2017. here for follow up. Patient states Vivelle  Dot 0.5 mg twice weekly not helping with symptoms.  Still with hot flashes and night sweats.   States she has a good application of the patch.   Gaining weight and bothered by this. Feels like she is not eating in a way that should be causing this.   Still tired and irritable.  Mood is better.  Seeing shadows that are not there.  Used to see gigantic insects that are not really real, and now she sees small insects around her that are not really there.  She is always telling herself that they are not real. Denies suicidal ideation.  She has not seen her PCP about this.  She has an appointment with Dr. Stephanie Acre in Nov.   PCP following her anemia.  She had this checked on August 9, and it was resolved.   GYNECOLOGIC HISTORY: Patient's last menstrual period was 03/29/2017. Contraception:  Tubal/Hysterectomy Menopausal hormone therapy:  Vivelle Dot Last mammogram: 06/06/2018 - [facility] - BI-RADS 1, cat B density. Last pap smear:   02/2016 at [facility] -- normal per patient        OB History    Gravida  3   Para  2   Term  0   Preterm  0   AB  1   Living  2     SAB  1   TAB  0   Ectopic  0   Multiple  0   Live Births  0              Patient Active Problem List   Diagnosis Date Noted  . Migraine without aura 09/09/2017  . Ileus ([place]) 06/21/2017  . Ileus, postoperative ([place]) 06/21/2017  . Uterine fibroid 06/11/2017  . Iron deficiency anemia due to chronic blood loss 04/08/2017  . 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 Fibroids 03/31/2017    Past Medical History:  Diagnosis Date  . Abnormal uterine bleeding   . Allergic rhinitis   . Anemia   . Asthma   . Depression    hx of  . Fibroid   . History of esophagogastroduodenoscopy (EGD) 2016   H pylori +  . Hypercholesterolemia    . Migraine    with aura  . Patient is Jehovah's Witness   . Vitamin D deficiency     Past Surgical History:  Procedure Laterality Date  . ROBOTIC ASSISTED TOTAL HYSTERECTOMY Bilateral 06/11/2017   Procedure: XI ROBOTIC ASSISTED TOTAL HYSTERECTOMY BILATERAL SALPINGECTOMY, RIGHT OOPHERECTOMY,  LYSIS OF ADHESIONS ,MINI LAPAROTOMY;  Surgeon: Everitt Amber, MD;  Location: [facility];  Service: Gynecology;  Laterality: Bilateral;  . TUBAL LIGATION     then reversal  . UTERINE FIBROID SURGERY      Current Outpatient Medications  Medication Sig Dispense Refill  . albuterol (PROVENTIL HFA;VENTOLIN HFA) 108 (90 BASE) MCG/ACT inhaler Inhale 1-2 puffs into the lungs every 6 (six) hours as needed for wheezing or shortness of breath.    Marland Kitchen albuterol (PROVENTIL) (5 MG/ML) 0.5% nebulizer solution Take 2.5 mg by nebulization every 6 (six) hours as needed for wheezing or shortness of breath.    Marland Kitchen aspirin-acetaminophen-caffeine (EXCEDRIN MIGRAINE) 250-250-65 MG tablet Take 3 tablets by mouth 3 (three) times daily as needed for headache.     . eletriptan (RELPAX) 40 MG tablet Take 1  tablet (40 mg total) by mouth as needed for migraine or headache. May repeat in 2 hours if headache persists or recurs. 10 tablet 11  . estradiol (VIVELLE-DOT) 0.05 MG/24HR patch Place 1 patch (0.05 mg total) onto the skin 2 (two) times a week. 8 patch 12  . fluticasone-salmeterol (ADVAIR HFA) 45-21 MCG/ACT inhaler Inhale 2 puffs into the lungs 2 (two) times daily.    . Fremanezumab-vfrm (AJOVY) 225 MG/1.5ML SOSY Inject 1 Syringe into the skin every 30 (thirty) days. 1 Syringe 11  . ibuprofen (ADVIL,MOTRIN) 200 MG tablet Take 400 mg by mouth every 6 (six) hours as needed for moderate pain.    Marland Kitchen ketoprofen (ORUDIS) 75 MG capsule Take 75 mg by mouth 3 (three) times daily.    . propranolol (INDERAL) 80 MG tablet Take 1 tablet (80 mg total) by mouth at bedtime. 30 tablet 0  . SUMAtriptan (IMITREX) 6 MG/0.5ML SOLN injection Take one dose at  headache onset, can take additional dose 2hrs later if needed. No more then 2 injections in 24hrs 10 vial 1  . Vitamin D, Ergocalciferol, (DRISDOL) 50000 units CAPS capsule TK 1 C PO ONCE WEEKLY  0   No current facility-administered medications for this visit.      ALLERGIES: Codeine; Kiwi extract; Levetiracetam; Other; and Powder  Family History  Problem Relation Age of Onset  . Hypertension Mother   . Diabetes Mother   . Osteoporosis Mother   . Hypertension Father   . Prostate cancer Father   . Heart attack Father   . Kidney cancer Sister     Social History   Socioeconomic History  . Marital status: Married    Spouse name: Not on file  . Number of children: Not on file  . Years of education: Not on file  . Highest education level: Not on file  Occupational History  . Not on file  Social Needs  . Financial resource strain: Not on file  . Food insecurity:    Worry: Not on file    Inability: Not on file  . Transportation needs:    Medical: Not on file    Non-medical: Not on file  Tobacco Use  . Smoking status: Never Smoker  . Smokeless tobacco: Never Used  Substance and Sexual Activity  . Alcohol use: Yes    Alcohol/week: 2.0 - 4.0 standard drinks    Types: 2 - 4 Cans of beer per week    Comment: 1-2 beers per day  . Drug use: No  . Sexual activity: Yes    Birth control/protection: None  Lifestyle  . Physical activity:    Days per week: Not on file    Minutes per session: Not on file  . Stress: Not on file  Relationships  . Social connections:    Talks on phone: Not on file    Gets together: Not on file    Attends religious service: Not on file    Active member of club or organization: Not on file    Attends meetings of clubs or organizations: Not on file    Relationship status: Not on file  . Intimate partner violence:    Fear of current or ex partner: Not on file    Emotionally abused: Not on file    Physically abused: Not on file    Forced sexual  activity: Not on file  Other Topics Concern  . Not on file  Social History Narrative  . Not on file    Review  of Systems  All other systems reviewed and are negative.   PHYSICAL EXAMINATION:    BP 130/74 (BP Location: Right Arm, Patient Position: Sitting, Cuff Size: Normal)   Pulse 70   Ht 5\' 1"  (1.549 m)   Wt 148 lb (67.1 kg)   LMP 03/29/2017   BMI 27.96 kg/m     General appearance: alert, cooperative and appears stated age   ASSESSMENT  Status post robotic hysterectomy, bilateral salpingectomy, right oophorectomy 06/11/17.  Menopausal symptoms.  Depression with hallucinations. Migraine with aura.  Fatigue. Weight gain.  PLAN  Check TSH.  We talked about weight loss through diet and exercise. She will increase her exercise and consider Weight Watchers.   If no success, I will refer her to a weight loss clinic.  Increase Vivelle dot to 0.1 mg twice weekly.  Rx for one year.  Referral to psychiatry.  Fu for annual exam and prn.    An After Visit Summary was printed and given to the patient.  __25____ minutes face to face time of which over 50% was spent in counseling.

## 2018-07-24 LAB — TSH: TSH: 2.52 u[IU]/mL (ref 0.450–4.500)

## 2018-07-29 DIAGNOSIS — R441 Visual hallucinations: Secondary | ICD-10-CM | POA: Diagnosis not present

## 2018-07-29 DIAGNOSIS — H9193 Unspecified hearing loss, bilateral: Secondary | ICD-10-CM | POA: Diagnosis not present

## 2018-07-30 ENCOUNTER — Telehealth: Payer: Self-pay | Admitting: Obstetrics and Gynecology

## 2018-07-30 NOTE — Telephone Encounter (Signed)
Patient left voicemail over lunch stating that she was returning a call. No prior phone note.

## 2018-07-30 NOTE — Telephone Encounter (Signed)
Spoke with patient.   1. Advised of normal TSH results dated 07/23/18.   2. Provide update for Dr. Quincy Simmonds. Seen by PCP/Dr. Stephanie Acre on 07/29/18. Discussed seeing shadows and small insects with PCP. Patient states she is being referred back to Neurology for f/u, "possible form of seizures".   Routing to provider for final review. Patient is agreeable to disposition. Will close encounter.

## 2018-08-18 DIAGNOSIS — E559 Vitamin D deficiency, unspecified: Secondary | ICD-10-CM | POA: Diagnosis not present

## 2018-12-15 ENCOUNTER — Telehealth: Payer: Self-pay | Admitting: Obstetrics and Gynecology

## 2018-12-15 NOTE — Telephone Encounter (Signed)
Patient is having an allergic reaction to patches. Has a red,burning rash.

## 2018-12-15 NOTE — Telephone Encounter (Signed)
Call to patient. Patient states that for the past month, every time she replaces her Estradiol patch, the area is fine for a day or two and then the area starts feeling "weird." States she then takes the patch off and has a rectangle red, raised and itchy place in the spot the patch was. Patient states current rash in on the front side of her right hip. Denies SOB or swelling. Patient states she is trying not to scratch the area because then it whelps up. Patient also states once the area heals it leaves a brown patch. Patient states she took her patch off yesterday and has not replaced. RN advised not to replace. OV offered. Patient scheduled for 12-16-2018 at 0930 with Dr. Quincy Simmonds. Patient agreeable to date and time of appointment.   Routing to provider and will close encounter.

## 2018-12-16 ENCOUNTER — Ambulatory Visit (INDEPENDENT_AMBULATORY_CARE_PROVIDER_SITE_OTHER): Payer: BLUE CROSS/BLUE SHIELD | Admitting: Obstetrics and Gynecology

## 2018-12-16 ENCOUNTER — Other Ambulatory Visit: Payer: Self-pay

## 2018-12-16 ENCOUNTER — Encounter: Payer: Self-pay | Admitting: Obstetrics and Gynecology

## 2018-12-16 VITALS — BP 124/80 | HR 68 | Resp 16 | Wt 146.0 lb

## 2018-12-16 DIAGNOSIS — N951 Menopausal and female climacteric states: Secondary | ICD-10-CM | POA: Diagnosis not present

## 2018-12-16 DIAGNOSIS — G4700 Insomnia, unspecified: Secondary | ICD-10-CM

## 2018-12-16 DIAGNOSIS — R635 Abnormal weight gain: Secondary | ICD-10-CM | POA: Diagnosis not present

## 2018-12-16 DIAGNOSIS — R21 Rash and other nonspecific skin eruption: Secondary | ICD-10-CM | POA: Diagnosis not present

## 2018-12-16 MED ORDER — ESTRADIOL 1 MG PO TABS: 1.0000 mg | ORAL_TABLET | Freq: Every day | ORAL | 1 refills | Status: DC

## 2018-12-16 NOTE — Progress Notes (Signed)
GYNECOLOGY  VISIT   HPI: 53 y.o.   Married  [place] American  female   [phone] with Patient's last menstrual period was 03/29/2017.   here for rash near vivelle dot patch    Used the estrogen patch for 1.5 months and started to feel skin irritation.  Moved the site of the patch and she developed irritation and itching again.   Her hot flashes are controlled and likes this.   Gaining weight.  Bought a total gym.  Feels snappy and irritated in general.   Not sleeping well.  Goes to bed at midnight and then wakes up at 3:00 am.  Then gets up to use the bathroom.   GYNECOLOGIC HISTORY: Patient's last menstrual period was 03/29/2017. Contraception:  Hysterectomy Menopausal hormone therapy:  Vivelle-Dot Last mammogram:  8/30/209 - [facility] - BI-RADS 1, cat B density Last pap smear:   02/2016 at [facility] -- normal per patient        OB History    Gravida  3   Para  2   Term  0   Preterm  0   AB  1   Living  2     SAB  1   TAB  0   Ectopic  0   Multiple  0   Live Births  0              Patient Active Problem List   Diagnosis Date Noted  . Migraine without aura 09/09/2017  . Ileus ([place]) 06/21/2017  . Ileus, postoperative ([place]) 06/21/2017  . Uterine fibroid 06/11/2017  . Iron deficiency anemia due to chronic blood loss 04/08/2017  . 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 Fibroids 03/31/2017    Past Medical History:  Diagnosis Date  . Abnormal uterine bleeding   . Allergic rhinitis   . Anemia   . Asthma   . Depression    hx of  . Fibroid   . History of esophagogastroduodenoscopy (EGD) 2016   H pylori +  . Hypercholesterolemia   . Migraine    with aura  . Patient is Jehovah's Witness   . Vitamin D deficiency     Past Surgical History:  Procedure Laterality Date  . ROBOTIC ASSISTED TOTAL HYSTERECTOMY Bilateral 06/11/2017   Procedure: XI ROBOTIC ASSISTED TOTAL HYSTERECTOMY BILATERAL SALPINGECTOMY, RIGHT OOPHERECTOMY,  LYSIS OF ADHESIONS ,MINI  LAPAROTOMY;  Surgeon: Everitt Amber, MD;  Location: [facility];  Service: Gynecology;  Laterality: Bilateral;  . TUBAL LIGATION     then reversal  . UTERINE FIBROID SURGERY      Current Outpatient Medications  Medication Sig Dispense Refill  . albuterol (PROVENTIL HFA;VENTOLIN HFA) 108 (90 BASE) MCG/ACT inhaler Inhale 1-2 puffs into the lungs every 6 (six) hours as needed for wheezing or shortness of breath.    Marland Kitchen albuterol (PROVENTIL) (5 MG/ML) 0.5% nebulizer solution Take 2.5 mg by nebulization every 6 (six) hours as needed for wheezing or shortness of breath.    Marland Kitchen aspirin-acetaminophen-caffeine (EXCEDRIN MIGRAINE) 250-250-65 MG tablet Take 3 tablets by mouth 3 (three) times daily as needed for headache.     . eletriptan (RELPAX) 40 MG tablet Take 1 tablet (40 mg total) by mouth as needed for migraine or headache. May repeat in 2 hours if headache persists or recurs. 10 tablet 11  . fluticasone-salmeterol (ADVAIR HFA) 45-21 MCG/ACT inhaler Inhale 2 puffs into the lungs 2 (two) times daily.    Marland Kitchen ibuprofen (ADVIL,MOTRIN) 200 MG tablet Take 400 mg by mouth every 6 (  six) hours as needed for moderate pain.    Marland Kitchen ketoprofen (ORUDIS) 75 MG capsule Take 75 mg by mouth 3 (three) times daily.    . propranolol (INDERAL) 80 MG tablet Take 1 tablet (80 mg total) by mouth at bedtime. 30 tablet 0  . SUMAtriptan (IMITREX) 6 MG/0.5ML SOLN injection Take one dose at headache onset, can take additional dose 2hrs later if needed. No more then 2 injections in 24hrs 10 vial 1   No current facility-administered medications for this visit.      ALLERGIES: Codeine; Kiwi extract; Levetiracetam; Other; and Powder  Family History  Problem Relation Age of Onset  . Hypertension Mother   . Diabetes Mother   . Osteoporosis Mother   . Hypertension Father   . Prostate cancer Father   . Heart attack Father   . Kidney cancer Sister     Social History   Socioeconomic History  . Marital status: Married    Spouse name: Not  on file  . Number of children: Not on file  . Years of education: Not on file  . Highest education level: Not on file  Occupational History  . Not on file  Social Needs  . Financial resource strain: Not on file  . Food insecurity:    Worry: Not on file    Inability: Not on file  . Transportation needs:    Medical: Not on file    Non-medical: Not on file  Tobacco Use  . Smoking status: Never Smoker  . Smokeless tobacco: Never Used  Substance and Sexual Activity  . Alcohol use: Yes    Comment: 1-2 beers per day  . Drug use: No  . Sexual activity: Yes    Birth control/protection: Surgical    Comment: hysterectomy  Lifestyle  . Physical activity:    Days per week: Not on file    Minutes per session: Not on file  . Stress: Not on file  Relationships  . Social connections:    Talks on phone: Not on file    Gets together: Not on file    Attends religious service: Not on file    Active member of club or organization: Not on file    Attends meetings of clubs or organizations: Not on file    Relationship status: Not on file  . Intimate partner violence:    Fear of current or ex partner: Not on file    Emotionally abused: Not on file    Physically abused: Not on file    Forced sexual activity: Not on file  Other Topics Concern  . Not on file  Social History Narrative  . Not on file    Review of Systems  HENT: Negative.   Respiratory: Negative.   Cardiovascular: Negative.   Gastrointestinal: Negative.   Endocrine: Negative.   Genitourinary: Negative.   Musculoskeletal: Negative.   Skin: Positive for rash.  Allergic/Immunologic:       Rash, mood changes  Neurological: Negative.   Hematological: Negative.   Psychiatric/Behavioral: Negative.     PHYSICAL EXAMINATION:    BP 124/80   Pulse 68   Resp 16   Wt 146 lb (66.2 kg)   LMP 03/29/2017   BMI 27.59 kg/m     General appearance: alert, cooperative and appears stated age   Skin: Skin color, texture, turgor  normal. Outline of patches on her skin.    ASSESSMENT  Menopausal symptoms.  Reaction to adhesive in transdermal estrogen patch.  Insomnia.  Weight  gain.   PLAN  Stop using transdermal estrogen due to reaction to adhesive. Start Estrace 1 mg daily.  #90, RF one.  Increase exercise.  We discussed Weight Watchers.  Consider Melatonin. Stop caffeine use.  We talked about good sleep hygiene. See PCP regarding insomnia if persists. FU for annual exam in 6 mo.   An After Visit Summary was printed and given to the patient.  ___15___ minutes face to face time of which over 50% was spent in counseling.

## 2018-12-16 NOTE — Patient Instructions (Signed)

## 2019-02-05 DIAGNOSIS — J4531 Mild persistent asthma with (acute) exacerbation: Secondary | ICD-10-CM | POA: Diagnosis not present

## 2019-02-05 DIAGNOSIS — J4530 Mild persistent asthma, uncomplicated: Secondary | ICD-10-CM | POA: Diagnosis not present

## 2019-06-12 ENCOUNTER — Other Ambulatory Visit: Payer: Self-pay

## 2019-06-13 ENCOUNTER — Other Ambulatory Visit: Payer: Self-pay | Admitting: Obstetrics and Gynecology

## 2019-06-16 NOTE — Progress Notes (Signed)
53 y.o. WS:[id number] Married [place] American female here for annual exam.    Estrogen working well to control her hot flashes.   States her depression is better.  She states she is able to control this. She still thinks she is seeing something like a black shadow moving out of the corner of her eye, like an insect.  She is also having migraine HA.   Home schooling her grandchildren.  PCP:  Jonathon Jordan, MD   Patient's last menstrual period was 03/29/2017.           Sexually active: Yes.    The current method of family planning is status post hysterectomy.    Exercising: Yes.    walking, yard work, has [employer], advertising Smoker:  no  Health Maintenance: Pap: 02/2016 normal per patient, 09-13-15 Neg:Neg HR HPV History of abnormal Pap:  no MMG: 06-06-18 Neg/density B/BiRads1-- has appt. tomorrow Colonoscopy:  06-22-15 normal;next 10 years BMD:   n/a  Result  n/a TDaP:  PCP Gardasil:   n/a HIV:neg during preg Hep C:no Screening Labs:   PCP.   reports that she has never smoked. She has never used smokeless tobacco. She reports current alcohol use of about 4.0 standard drinks of alcohol per week. She reports that she does not use drugs.  Past Medical History:  Diagnosis Date  . Abnormal uterine bleeding   . Allergic rhinitis   . Anemia   . Asthma   . Depression    hx of  . Fibroid   . History of esophagogastroduodenoscopy (EGD) 2016   H pylori +  . Hypercholesterolemia   . Migraine    with aura  . Patient is Jehovah's Witness   . Vitamin D deficiency     Past Surgical History:  Procedure Laterality Date  . ROBOTIC ASSISTED TOTAL HYSTERECTOMY Bilateral 06/11/2017   Procedure: XI ROBOTIC ASSISTED TOTAL HYSTERECTOMY BILATERAL SALPINGECTOMY, RIGHT OOPHERECTOMY,  LYSIS OF ADHESIONS ,MINI LAPAROTOMY;  Surgeon: Everitt Amber, MD;  Location: [facility];  Service: Gynecology;  Laterality: Bilateral;  . TUBAL LIGATION     then reversal  . UTERINE FIBROID SURGERY      Current Outpatient  Medications  Medication Sig Dispense Refill  . albuterol (PROVENTIL HFA;VENTOLIN HFA) 108 (90 BASE) MCG/ACT inhaler Inhale 1-2 puffs into the lungs every 6 (six) hours as needed for wheezing or shortness of breath.    Marland Kitchen albuterol (PROVENTIL) (5 MG/ML) 0.5% nebulizer solution Take 2.5 mg by nebulization every 6 (six) hours as needed for wheezing or shortness of breath.    Marland Kitchen aspirin-acetaminophen-caffeine (EXCEDRIN MIGRAINE) 250-250-65 MG tablet Take 3 tablets by mouth 3 (three) times daily as needed for headache.     . estradiol (ESTRACE) 1 MG tablet Take 1 tablet (1 mg total) by mouth daily. 90 tablet 1  . fluticasone-salmeterol (ADVAIR HFA) 45-21 MCG/ACT inhaler Inhale 2 puffs into the lungs 2 (two) times daily.    Marland Kitchen ibuprofen (ADVIL,MOTRIN) 200 MG tablet Take 400 mg by mouth every 6 (six) hours as needed for moderate pain.    . SUMAtriptan (IMITREX) 6 MG/0.5ML SOLN injection Take one dose at headache onset, can take additional dose 2hrs later if needed. No more then 2 injections in 24hrs 10 vial 1   No current facility-administered medications for this visit.     Family History  Problem Relation Age of Onset  . Hypertension Mother   . Diabetes Mother   . Osteoporosis Mother   . Hypertension Father   . Prostate cancer Father   .  Heart attack Father   . Kidney cancer Sister     Review of Systems  All other systems reviewed and are negative.   Exam:   BP 138/70   Pulse 70   Temp (!) 97.2 F (36.2 C) (Temporal)   Resp 16   Ht 5\' 1"  (1.549 m)   Wt 141 lb 9.6 oz (64.2 kg)   LMP 03/29/2017   BMI 26.76 kg/m     General appearance: alert, cooperative and appears stated age Head: normocephalic, without obvious abnormality, atraumatic Neck: no adenopathy, supple, symmetrical, trachea midline and thyroid normal to inspection and palpation Lungs: clear to auscultation bilaterally Breasts: normal appearance, no masses or tenderness, No nipple retraction or dimpling, No nipple discharge  or bleeding, No axillary adenopathy Heart: regular rate and rhythm Abdomen: soft, non-tender; no masses, no organomegaly Extremities: extremities normal, atraumatic, no cyanosis or edema Skin: skin color, texture, turgor normal. No rashes or lesions Lymph nodes: cervical, supraclavicular, and axillary nodes normal. Neurologic: grossly normal  Pelvic: External genitalia:  no lesions              No abnormal inguinal nodes palpated.              Urethra:  normal appearing urethra with no masses, tenderness or lesions              Bartholins and Skenes: normal                 Vagina: normal appearing vagina with normal color and discharge, no lesions              Cervix:  absent              Pap taken: No. Bimanual Exam:  Uterus:   absent              Adnexa: no mass, fullness, tenderness              Rectal exam: Yes.  .  Confirms.              Anus:  normal sphincter tone, no lesions  Chaperone was present for exam.  Assessment:   Well woman visit with normal exam. Status post robotic hysterectomy, bilateral salpingectomy, right oophorectomy 06/11/17.  Menopausal symptoms controlled on ERT. Visual change, hallucination? Migraine with aura.  Asthma.   Plan: Mammogram screening discussed. Self breast awareness reviewed. Pap and HR HPV as above. Guidelines for Calcium, Vitamin D, regular exercise program including cardiovascular and weight bearing exercise. Refill of ERT.  Discused WHI and use of HRT which can increase risk of PE, DVT, MI, stroke and breast cancer.  Flu vaccine strongly recommended.  Referral to psychiatry.  Follow up annually and prn.   After visit summary provided.

## 2019-06-16 NOTE — Telephone Encounter (Signed)
Medication refill request: Estradiol Last AEX:  06/11/2018 BS Next AEX: 06/17/2019 Last MMG (if hormonal medication request): 06/06/2018 BIRADS 1 Negative Density B Refill authorized: Pending authorization. #90 with no refills if appropriate. Please advise.

## 2019-06-17 ENCOUNTER — Other Ambulatory Visit: Payer: Self-pay

## 2019-06-17 ENCOUNTER — Ambulatory Visit (INDEPENDENT_AMBULATORY_CARE_PROVIDER_SITE_OTHER): Payer: Medicare Other | Admitting: Obstetrics and Gynecology

## 2019-06-17 ENCOUNTER — Encounter: Payer: Self-pay | Admitting: Obstetrics and Gynecology

## 2019-06-17 VITALS — BP 138/70 | HR 70 | Temp 97.2°F | Resp 16 | Ht 61.0 in | Wt 141.6 lb

## 2019-06-17 DIAGNOSIS — Z01419 Encounter for gynecological examination (general) (routine) without abnormal findings: Secondary | ICD-10-CM

## 2019-06-17 DIAGNOSIS — Z124 Encounter for screening for malignant neoplasm of cervix: Secondary | ICD-10-CM

## 2019-06-17 DIAGNOSIS — R441 Visual hallucinations: Secondary | ICD-10-CM

## 2019-06-17 NOTE — Patient Instructions (Signed)

## 2019-06-19 ENCOUNTER — Encounter: Payer: Self-pay | Admitting: Obstetrics and Gynecology

## 2019-06-19 DIAGNOSIS — Z1231 Encounter for screening mammogram for malignant neoplasm of breast: Secondary | ICD-10-CM | POA: Diagnosis not present

## 2019-07-17 DIAGNOSIS — Z23 Encounter for immunization: Secondary | ICD-10-CM | POA: Diagnosis not present

## 2019-07-21 DIAGNOSIS — E7800 Pure hypercholesterolemia, unspecified: Secondary | ICD-10-CM | POA: Diagnosis not present

## 2019-07-21 DIAGNOSIS — E559 Vitamin D deficiency, unspecified: Secondary | ICD-10-CM | POA: Diagnosis not present

## 2019-07-21 DIAGNOSIS — G43909 Migraine, unspecified, not intractable, without status migrainosus: Secondary | ICD-10-CM | POA: Diagnosis not present

## 2019-07-21 DIAGNOSIS — Z0000 Encounter for general adult medical examination without abnormal findings: Secondary | ICD-10-CM | POA: Diagnosis not present

## 2019-07-21 DIAGNOSIS — Z79899 Other long term (current) drug therapy: Secondary | ICD-10-CM | POA: Diagnosis not present

## 2019-07-21 DIAGNOSIS — J4530 Mild persistent asthma, uncomplicated: Secondary | ICD-10-CM | POA: Diagnosis not present

## 2019-08-31 ENCOUNTER — Encounter: Payer: Self-pay | Admitting: Adult Health

## 2019-08-31 ENCOUNTER — Ambulatory Visit: Payer: Medicare Other | Admitting: Adult Health

## 2019-09-30 DIAGNOSIS — R079 Chest pain, unspecified: Secondary | ICD-10-CM | POA: Diagnosis not present

## 2019-09-30 DIAGNOSIS — M940 Chondrocostal junction syndrome [Tietze]: Secondary | ICD-10-CM | POA: Diagnosis not present

## 2019-09-30 DIAGNOSIS — G43909 Migraine, unspecified, not intractable, without status migrainosus: Secondary | ICD-10-CM | POA: Diagnosis not present

## 2019-12-04 ENCOUNTER — Ambulatory Visit
Admission: RE | Admit: 2019-12-04 | Discharge: 2019-12-04 | Disposition: A | Discharge status: Home / Self Care | Payer: BLUE CROSS/BLUE SHIELD | Source: Ambulatory Visit | Attending: Family Medicine | Admitting: Family Medicine

## 2019-12-04 ENCOUNTER — Other Ambulatory Visit: Payer: Self-pay | Admitting: Family Medicine

## 2019-12-04 DIAGNOSIS — M79644 Pain in right finger(s): Secondary | ICD-10-CM | POA: Diagnosis not present

## 2019-12-04 DIAGNOSIS — S6991XA Unspecified injury of right wrist, hand and finger(s), initial encounter: Secondary | ICD-10-CM

## 2019-12-04 DIAGNOSIS — S6990XA Unspecified injury of unspecified wrist, hand and finger(s), initial encounter: Secondary | ICD-10-CM | POA: Diagnosis not present

## 2019-12-14 ENCOUNTER — Encounter: Payer: Self-pay | Admitting: Neurology

## 2019-12-14 ENCOUNTER — Other Ambulatory Visit: Payer: Self-pay

## 2019-12-14 ENCOUNTER — Ambulatory Visit (INDEPENDENT_AMBULATORY_CARE_PROVIDER_SITE_OTHER): Payer: BC Managed Care – PPO | Admitting: Neurology

## 2019-12-14 VITALS — BP 155/86 | HR 72 | Temp 97.1°F | Ht 61.75 in | Wt 138.0 lb

## 2019-12-14 DIAGNOSIS — G43109 Migraine with aura, not intractable, without status migrainosus: Secondary | ICD-10-CM

## 2019-12-14 DIAGNOSIS — G43709 Chronic migraine without aura, not intractable, without status migrainosus: Secondary | ICD-10-CM | POA: Diagnosis not present

## 2019-12-14 MED ORDER — UBRELVY 100 MG PO TABS: 100.0000 mg | ORAL_TABLET | ORAL | 6 refills | Status: DC | PRN

## 2019-12-14 MED ORDER — EMGALITY 120 MG/ML ~~LOC~~ SOAJ: 120.0000 mg | SUBCUTANEOUS | 11 refills | Status: DC

## 2019-12-14 NOTE — Progress Notes (Signed)
[id number]    Provider:  Dr Jaynee Eagles Referring Provider: Jonathon Jordan, MD Primary Care Physician:  Jonathon Jordan, MD  CC:  Migraines   12/14/2019: We have not seen patient since 2018, here for migraines In the past she tried multiple medications (see below). She states since she saw [place] she has tried aimovig with a bad rash. The can be on either side, behind the eye, can last 24-72 hours and moderately severe to severe. Pulsating, pounding, throbbing, light and sound sensitivity, smells cam trigger and bother, she has a UV light, light can trigger even natural sunlight. 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 She has had them since the age of 13. Worsening in the last year. She takes excedrin but understands about medication overuse. They cn be severe. Sleep helps, excedrin helps. No other focal neurologic deficits, associated symptoms, inciting events or modifiable factors. Ajovy had a large rash. She has a latex allergy cannot take aimovig.    Patient had hair loss with topiramate. Doesn't want to try Zonegran bc it has similar side effects. Medications tried: Sumatriptan po, zolmitriptan, nortriptyline, tizanidine, Keppra (hallucinations and suicidal ideation), excedrin migraine, advil. Amerge, Topamx, maxalt, steroids, Topiramate (allopecia), Zonegran (doesn't want to try bc may also have allopecia), Qudexy. By review of chart additional meds tried since being seen in 2018 include: Aimovig, excedrin, ibuprofen, imitrex injections, gabapentin, diclofenac tan relpax, reglan and ketorolac injections, zomig, tramadol, propranolol, zofran tablet.  Ajovy had a large rash. She has a latex allergy cannot take aimovig.      07/24/2017: showed No acute intracranial abnormalities including mass lesion or mass effect, hydrocephalus, extra-axial fluid collection, midline shift, hemorrhage, or acute infarction, large ischemic events (personally reviewed images).   07/2018 TSH  normal   PRIOR Addendum Patient had hair loss with topiramate. Doesn't want to try Zonegran bc it has similar side effects. Medications tried: Sumatriptan, zolmitriptan, nortriptyline, tizanidine, Keppra (hallucinations and suicidal ideation), excedrin migraine, advil. Amerge, Topamx, maxalt, steroids   Medications tried: Sumatriptan, zolmitriptan, nortriptyline, tizanidine, Keppra (hallucinations and suicidal ideation), excedrin migraine, advil. Amerge, Topamx, maxalt, steroids, Topiramate (allopecia), Zonegran (doesn't want to try bc may also have allopecia), Qudexy.   HPI:  Stephanie Hancock is a 54 y.o. female here as a referral from Dr. Stephanie Acre for migraines.  Past medical history of asthma, migraines, anemia, uterine fibroids, rhinitis, H. pylori, vitamin D deficiency. Mother with migraines. Daughter with migraines. She has had migraines since the age of 82. She used to get an aura, "bouncy orbs or black and white lines", no auras now. She takes excedrin excessively, +overuse. No aura. Daily headaches, she has 14 migrainous a month for years and can last all day. If she didn't treat she would have daily migraines. They can be unilateral, pounding, throbbing, behind the eye, pain in the left temple, nausea, vomiting, light and sound sensitivity. Lights aggravate. Sitting still helps in a dark room. Weather changes with aggravate her migraines.   Medications tried: Sumatriptan, zolmitriptan, nortriptyline, tizanidine, Keppra (hallucinations and suicidal ideation), excedrin migraine, advil. Amerge, Topamx, maxalt, steroids   Reviewed notes, labs and imaging from outside physicians, which showed:  MRI brain 07/2017 showed No acute intracranial abnormalities including mass lesion or mass effect, hydrocephalus, extra-axial fluid collection, midline shift, hemorrhage, or acute infarction, large ischemic events (personally reviewed images)  CBC with anemia 11.2/33.5, BMP elevated glusoce.  06/2017.  Medications tried: Sumatriptan, zolmitriptan, nortriptyline, tizanidine.  Patient was being seen by the [facility] and he retired.  Review of Systems: Patient complains of symptoms per HPI as well as the following symptoms: headache, insomnia. Pertinent negatives and positives per HPI. All others negative.   Social History   Socioeconomic History  . Marital status: Married    Spouse name: Not on file  . Number of children: 2  . Years of education: Not on file  . Highest education level: Some college, no degree  Occupational History  . Not on file  Tobacco Use  . Smoking status: Never Smoker  . Smokeless tobacco: Never Used  Substance and Sexual Activity  . Alcohol use: Yes    Alcohol/week: 4.0 standard drinks    Types: 4 Standard drinks or equivalent per week  . Drug use: No  . Sexual activity: Yes    Birth control/protection: Surgical    Comment: hysterectomy  Other Topics Concern  . Not on file  Social History Narrative   Lives at home with husband   Caffeine: 1 cup coffee every other day   Right handed predominantly, uses both hands      Social Determinants of Health   Financial Resource Strain:   . Difficulty of Paying Living Expenses: Not on file  Food Insecurity:   . Worried About Charity fundraiser in the Last Year: Not on file  . Ran Out of Food in the Last Year: Not on file  Transportation Needs:   . Lack of Transportation (Medical): Not on file  . Lack of Transportation (Non-Medical): Not on file  Physical Activity:   . Days of Exercise per Week: Not on file  . Minutes of Exercise per Session: Not on file  Stress:   . Feeling of Stress : Not on file  Social Connections:   . Frequency of Communication with Friends and Family: Not on file  . Frequency of Social Gatherings with Friends and Family: Not on file  . Attends Religious Services: Not on file  . Active Member of Clubs or Organizations: Not on file  . Attends Theatre manager Meetings: Not on file  . Marital Status: Not on file  Intimate Partner Violence:   . Fear of Current or Ex-Partner: Not on file  . Emotionally Abused: Not on file  . Physically Abused: Not on file  . Sexually Abused: Not on file    Family History  Problem Relation Age of Onset  . Hypertension Mother   . Diabetes Mother   . Osteoporosis Mother   . Hypertension Father   . Prostate cancer Father   . Heart attack Father   . Kidney cancer Sister     Past Medical History:  Diagnosis Date  . Abnormal uterine bleeding   . Allergic rhinitis   . Anemia   . Asthma   . Depression    hx of  . Fibroid   . History of esophagogastroduodenoscopy (EGD) 2016   H pylori +  . Hypercholesterolemia   . Migraine    with aura  . Patient is Jehovah's Witness   . Vitamin D deficiency     Past Surgical History:  Procedure Laterality Date  . ROBOTIC ASSISTED TOTAL HYSTERECTOMY Bilateral 06/11/2017   Procedure: XI ROBOTIC ASSISTED TOTAL HYSTERECTOMY BILATERAL SALPINGECTOMY, RIGHT OOPHERECTOMY,  LYSIS OF ADHESIONS ,MINI LAPAROTOMY;  Surgeon: Everitt Amber, MD;  Location: [facility];  Service: Gynecology;  Laterality: Bilateral;  . TUBAL LIGATION     then reversal  . UTERINE FIBROID SURGERY      Current Outpatient Medications  Medication Sig Dispense  Refill  . albuterol (PROVENTIL HFA;VENTOLIN HFA) 108 (90 BASE) MCG/ACT inhaler Inhale 1-2 puffs into the lungs every 6 (six) hours as needed for wheezing or shortness of breath.    Marland Kitchen albuterol (PROVENTIL) (5 MG/ML) 0.5% nebulizer solution Take 2.5 mg by nebulization every 6 (six) hours as needed for wheezing or shortness of breath.    Marland Kitchen aspirin-acetaminophen-caffeine (EXCEDRIN MIGRAINE) 250-250-65 MG tablet Take 3 tablets by mouth 3 (three) times daily as needed for headache.     . estradiol (ESTRACE) 1 MG tablet TAKE 1 TABLET(1 MG) BY MOUTH DAILY 90 tablet 3  . fluticasone-salmeterol (ADVAIR HFA) 45-21 MCG/ACT inhaler Inhale 2 puffs into the  lungs 2 (two) times daily.    Marland Kitchen ibuprofen (ADVIL,MOTRIN) 200 MG tablet Take 400 mg by mouth every 6 (six) hours as needed for moderate pain.    . Galcanezumab-gnlm (EMGALITY) 120 MG/ML SOAJ Inject 120 mg into the skin every 30 (thirty) days. 1 pen 11  . SUMAtriptan (IMITREX) 6 MG/0.5ML SOLN injection Take one dose at headache onset, can take additional dose 2hrs later if needed. No more then 2 injections in 24hrs (Patient not taking: Reported on 12/14/2019) 10 vial 1  . Ubrogepant (UBRELVY) 100 MG TABS Take 100 mg by mouth every 2 (two) hours as needed. Maximum 200mg  a day. 10 tablet 6   No current facility-administered medications for this visit.    Allergies as of 12/14/2019 - Review Complete 12/14/2019  Allergen Reaction Noted  . Codeine Hives 09/25/2015  . Kiwi extract Swelling and Other (See Comments) 03/29/2017  . Levetiracetam Other (See Comments) 05/30/2017  . Other  06/03/2017  . Powder Itching and Other (See Comments) 05/30/2017    Vitals: BP (!) 155/86 (BP Location: Right Arm, Patient Position: Sitting)   Pulse 72   Temp (!) 97.1 F (36.2 C) Comment: taken at front  Ht 5' 1.75" (1.568 m)   Wt 138 lb (62.6 kg)   LMP 03/29/2017   BMI 25.45 kg/m  Last Weight:  Wt Readings from Last 1 Encounters:  12/14/19 138 lb (62.6 kg)   Last Height:   Ht Readings from Last 1 Encounters:  12/14/19 5' 1.75" (1.568 m)    Physical exam: Exam: Gen: NAD, conversant, well nourised, well groomed                     CV: RRR, no MRG. No Carotid Bruits. No peripheral edema, warm, nontender Eyes: Conjunctivae clear without exudates or hemorrhage  Neuro: Detailed Neurologic Exam  Speech:    Speech is normal; fluent and spontaneous with normal comprehension.  Cognition:    The patient is oriented to person, place, and time;     recent and remote memory intact;     language fluent;     normal attention, concentration,     fund of knowledge Cranial Nerves:    The pupils are equal,  round, and reactive to light. Extraocular movements are intact. Trigeminal sensation is intact and the muscles of mastication are normal. The face is symmetric. Hearing intact. Voice is normal. Shoulder shrug is normal.  Coordination:    No dysmetria or ataxia  Gait:    Normal native gait  Motor Observation:    No asymmetry, no atrophy, and no involuntary movements noted. Tone:    Normal muscle tone.    Posture:    Posture is normal. normal erect    Strength:    Strength is V/V in the upper and lower limbs.  Sensation: intact to LT       Assessment/Plan:  54 year old with chronic intractable migraines. She used to have auras, none recently.  We had a very long discussion today about acute management and preventative management.  Patient is aware of medication overuse.  She is tried a plethora of medications (see below) in both the acute and preventative classes.    -Preventative:  She cannot take Aimovig because of a latex allergy, Ajovy gave her a big rash.  We did discuss maybe trying a different class of medications such as Cymbalta or Effexor but she is concerned with the side effects.  She is willing to try Emgality, I did tell her to take 25 to 50 mg of Benadryl an hour beforehand, and call us if she has a rash.  She did not experience any allergic reaction, I did explain to her that rash is one of the most common side effects of these medications. I will not start the initial dose as 2 injection, will stick with once a month due to possibility of rash.   - Acute She would also like to try Ubrelvy, we will try to get it approved through Medicare.    -We also talked about hormone replacement therapy in women with migraine with aura and the risks she is to talk to Dr. Quincy Simmonds about it.   Patient had hair loss with topiramate. Doesn't want to try Zonegran bc it has similar side effects. Medications tried: Sumatriptan po, zolmitriptan, nortriptyline, tizanidine, Keppra  (hallucinations and suicidal ideation), excedrin migraine, advil. Amerge, Topamx, maxalt, steroids, Topiramate (allopecia), Zonegran (doesn't want to try bc may also have allopecia), Qudexy. By review of chart additional meds tried since being seen in 2018 include: Aimovig, excedrin, ibuprofen, imitrex injections, gabapentin, diclofenac tan relpax, reglan and ketorolac injections, zomig, tramadol, propranolol, zofran tablet.  Ajovy had a large rash. She has a latex allergy cannot take aimovig.   Discussed: There is increased risk for stroke in women with migraine with aura and a contraindication for the combined contraceptive pill for use by women who have migraine with aura. The risk for women with migraine without aura is lower. However other risk factors like smoking are far more likely to increase stroke risk than migraine. There is a recommendation for no smoking and for the use of OCPs without estrogen such as progestogen only pills particularly for women with migraine with aura.Marland Kitchen People who have migraine headaches with auras may be 3 times more likely to have a stroke caused by a blood clot, compared to migraine patients who don't see auras. Women who take hormone-replacement therapy may be 30 percent more likely to suffer a clot-based stroke than women not taking medication containing estrogen. Other risk factors like smoking and high blood pressure may be  much more important.   Discussed:  To prevent or relieve headaches, try the following: Cool Compress. Lie down and place a cool compress on your head.  Avoid headache triggers. If certain foods or odors seem to have triggered your migraines in the past, avoid them. A headache diary might help you identify triggers.  Include physical activity in your daily routine. Try a daily walk or other moderate aerobic exercise.  Manage stress. Find healthy ways to cope with the stressors, such as delegating tasks on your to-do list.  Practice relaxation  techniques. Try deep breathing, yoga, massage and visualization.  Eat regularly. Eating regularly scheduled meals and maintaining a healthy diet might help prevent headaches. Also,  drink plenty of fluids.  Follow a regular sleep schedule. Sleep deprivation might contribute to headaches Consider biofeedback. With this mind-body technique, you learn to control certain bodily functions -- such as muscle tension, heart rate and blood pressure -- to prevent headaches or reduce headache pain.    Proceed to emergency room if you experience new or worsening symptoms or symptoms do not resolve, if you have new neurologic symptoms or if headache is severe, or for any concerning symptom.   Provided education and documentation from American headache Society toolbox including articles on: chronic migraine medication overuse headache, chronic migraines, prevention of migraines, behavioral and other nonpharmacologic treatments for headache.   Sarina Ill, MD  [facility] [address] [place], [place] [address]  Phone [phone] Fax [phone]  I spent 45 minutes of face-to-face and non-face-to-face time with patient on the  1. Chronic migraine without aura without status migrainosus, not intractable   2. Migraine with aura and without status migrainosus, not intractable    diagnosis.  This included previsit chart review, lab review, study review, order entry, electronic health record documentation, patient education on the different diagnostic and therapeutic options, counseling and coordination of care, risks and benefits of management, compliance, or risk factor reduction

## 2019-12-14 NOTE — Patient Instructions (Addendum)
She is on HRT, we discussed migraine with aura and recommendations below, discuss with Dr. Elder Love Emgality monthly, take 25-50mg  benadryl one hour beforehand Acute: Ubrelvy   There is increased risk for stroke in women with migraine with aura and a contraindication for the combined contraceptive pill for use by women who have migraine with aura. The risk for women with migraine without aura is lower. However other risk factors like smoking are far more likely to increase stroke risk than migraine. There is a recommendation for no smoking and for the use of OCPs without estrogen such as progestogen only pills particularly for women with migraine with aura.Marland Kitchen People who have migraine headaches with auras may be 3 times more likely to have a stroke caused by a blood clot, compared to migraine patients who don't see auras. Women who take hormone-replacement therapy may be 30 percent more likely to suffer a clot-based stroke than women not taking medication containing estrogen. Other risk factors like smoking and high blood pressure may be  much more important.   Ubrogepant tablets What is this medicine? UBROGEPANT (ue BROE je pant) is used to treat migraine headaches with or without aura. An aura is a strange feeling or visual disturbance that warns you of an attack. It is not used to prevent migraines. This medicine may be used for other purposes; ask your health care provider or pharmacist if you have questions. COMMON BRAND NAME(S): Roselyn Meier What should I tell my health care provider before I take this medicine? They need to know if you have any of these conditions:  kidney disease  liver disease  an unusual or allergic reaction to ubrogepant, other medicines, foods, dyes, or preservatives  pregnant or trying to get pregnant  breast-feeding How should I use this medicine? Take this medicine by mouth with a glass of water. Follow the directions on the prescription label. You can take it  with or without food. If it upsets your stomach, take it with food. Take your medicine at regular intervals. Do not take it more often than directed. Do not stop taking except on your doctor's advice. Talk to your pediatrician about the use of this medicine in children. Special care may be needed. Overdosage: If you think you have taken too much of this medicine contact a poison control center or emergency room at once. NOTE: This medicine is only for you. Do not share this medicine with others. What if I miss a dose? This does not apply. This medicine is not for regular use. What may interact with this medicine? Do not take this medicine with any of the following medicines:  ceritinib  certain antibiotics like chloramphenicol, clarithromycin, telithromycin  certain antivirals for HIV like atazanavir, cobicistat, darunavir, delavirdine, fosamprenavir, indinavir, ritonavir  certain medicines for fungal infections like itraconazole, ketoconazole, posaconazole, voriconazole  conivaptan  grapefruit  idelalisib  mifepristone  nefazodone  ribociclib This medicine may also interact with the following medications:  carvedilol  certain medicines for seizures like phenobarbital, phenytoin  ciprofloxacin  cyclosporine  eltrombopag  fluconazole  fluvoxamine  quinidine  rifampin  St. John's wort  verapamil This list may not describe all possible interactions. Give your health care provider a list of all the medicines, herbs, non-prescription drugs, or dietary supplements you use. Also tell them if you smoke, drink alcohol, or use illegal drugs. Some items may interact with your medicine. What should I watch for while using this medicine? Visit your health care professional for regular checks on your  progress. Tell your health care professional if your symptoms do not start to get better or if they get worse. Your mouth may get dry. Chewing sugarless gum or sucking hard candy  and drinking plenty of water may help. Contact your health care professional if the problem does not go away or is severe. What side effects may I notice from receiving this medicine? Side effects that you should report to your doctor or health care professional as soon as possible:  allergic reactions like skin rash, itching or hives; swelling of the face, lips, or tongue Side effects that usually do not require medical attention (report these to your doctor or health care professional if they continue or are bothersome):  drowsiness  dry mouth  nausea  tiredness This list may not describe all possible side effects. Call your doctor for medical advice about side effects. You may report side effects to FDA at 1-800-FDA-1088. Where should I keep my medicine? Keep out of the reach of children. Store at room temperature between 15 and 30 degrees C (59 and 86 degrees F). Throw away any unused medicine after the expiration date. NOTE: This sheet is a summary. It may not cover all possible information. If you have questions about this medicine, talk to your doctor, pharmacist, or health care provider.  2020 Elsevier/Gold Standard (2018-12-11 08:50:55) Galcanezumab injection What is this medicine? GALCANEZUMAB (gal ka NEZ ue mab) is used to prevent migraines and treat cluster headaches. This medicine may be used for other purposes; ask your health care provider or pharmacist if you have questions. COMMON BRAND NAME(S): Emgality What should I tell my health care provider before I take this medicine? They need to know if you have any of these conditions:  an unusual or allergic reaction to galcanezumab, other medicines, foods, dyes, or preservatives  pregnant or trying to get pregnant  breast-feeding How should I use this medicine? This medicine is for injection under the skin. You will be taught how to prepare and give this medicine. Use exactly as directed. Take your medicine at regular  intervals. Do not take your medicine more often than directed. It is important that you put your used needles and syringes in a special sharps container. Do not put them in a trash can. If you do not have a sharps container, call your pharmacist or healthcare provider to get one. Talk to your pediatrician regarding the use of this medicine in children. Special care may be needed. Overdosage: If you think you have taken too much of this medicine contact a poison control center or emergency room at once. NOTE: This medicine is only for you. Do not share this medicine with others. What if I miss a dose? If you miss a dose, take it as soon as you can. If it is almost time for your next dose, take only that dose. Do not take double or extra doses. What may interact with this medicine? Interactions are not expected. This list may not describe all possible interactions. Give your health care provider a list of all the medicines, herbs, non-prescription drugs, or dietary supplements you use. Also tell them if you smoke, drink alcohol, or use illegal drugs. Some items may interact with your medicine. What should I watch for while using this medicine? Tell your doctor or healthcare professional if your symptoms do not start to get better or if they get worse. What side effects may I notice from receiving this medicine? Side effects that you should report to  your doctor or health care professional as soon as possible:  allergic reactions like skin rash, itching or hives, swelling of the face, lips, or tongue Side effects that usually do not require medical attention (report these to your doctor or health care professional if they continue or are bothersome):  pain, redness, or irritation at site where injected This list may not describe all possible side effects. Call your doctor for medical advice about side effects. You may report side effects to FDA at 1-800-FDA-1088. Where should I keep my  medicine? Keep out of the reach of children. You will be instructed on how to store this medicine. Throw away any unused medicine after the expiration date on the label. NOTE: This sheet is a summary. It may not cover all possible information. If you have questions about this medicine, talk to your doctor, pharmacist, or health care provider.  2020 Elsevier/Gold Standard (2018-03-12 12:03:23)

## 2019-12-15 ENCOUNTER — Encounter: Payer: Self-pay | Admitting: *Deleted

## 2019-12-15 ENCOUNTER — Telehealth: Payer: Self-pay | Admitting: Neurology

## 2019-12-15 ENCOUNTER — Telehealth: Payer: Self-pay | Admitting: *Deleted

## 2019-12-15 NOTE — Telephone Encounter (Signed)
Per Cover My Meds, Effective from 12/15/2019 through 03/07/2020.

## 2019-12-15 NOTE — Telephone Encounter (Signed)
Completed Emgality PA on Cover My Meds. Key: [id number]. Awaiting determination.   Spoke with pt on phone. She reports 15-18 migraine days per month over last few months. She has a migraine today, tried one dose of Ubrelvy, not helping so far. I advised her to repeat again in 2 hours and let us know if she finds it doesn't work. Her previous lasted 12/07/19-12/10/19. Pt verbalized appreciation for the call.

## 2019-12-15 NOTE — Telephone Encounter (Signed)
Completed Ubrelvy PA on CMM. KeyQN:[id number]. Awaiting determination from [place].

## 2019-12-15 NOTE — Telephone Encounter (Addendum)
Approved right away. Effective from 12/15/2019 through 03/13/2020. I messaged pt in mychart to let her know and called her pharmacy as well.

## 2019-12-15 NOTE — Telephone Encounter (Signed)
Pt states she was advised by pharmacy a PA is needed for her  Galcanezumab-gnlm (EMGALITY) 120 MG/ML SOAJ

## 2020-02-25 ENCOUNTER — Encounter: Payer: Self-pay | Admitting: *Deleted

## 2020-03-23 ENCOUNTER — Encounter: Payer: Self-pay | Admitting: *Deleted

## 2020-03-28 ENCOUNTER — Telehealth: Payer: Self-pay | Admitting: Neurology

## 2020-03-28 NOTE — Telephone Encounter (Signed)
PA completed through cover my meds/ [employer] for [employer] KEY: [id number] Will await response

## 2020-03-28 NOTE — Telephone Encounter (Signed)
Pa completed for the pt through cover my meds/ [employer] for [place].  KEY: [id number]

## 2020-03-28 NOTE — Telephone Encounter (Signed)
Pt called needing to discuss issues that are going on with her getting her Ubrogepant (UBRELVY) 100 MG TABS and her Galcanezumab-gnlm (EMGALITY) 120 MG/ML SOAJ Please advise.

## 2020-03-28 NOTE — Telephone Encounter (Signed)
Called the patient back. In reviewing the recent mychart message [place], RN sent it looks like they were just wanting to et update on how well the patient was tolerating the medication and if it was effective. The pt states her migraines have been cut in half by taking the Emgality and describes that the [place] also is beneficial. Advised I will work on the PA for the patient to get the ball rolling.

## 2020-03-30 NOTE — Telephone Encounter (Signed)
Emgality approved 03/28/20-03/27/21. Pt notified.

## 2020-03-30 NOTE — Telephone Encounter (Signed)
Received fax from [facility]. Ubrelvy 100 mg approved 03/28/20-03/27/21. Pt notified.

## 2020-04-13 ENCOUNTER — Other Ambulatory Visit: Payer: Self-pay

## 2020-04-13 ENCOUNTER — Encounter (HOSPITAL_COMMUNITY): Payer: Self-pay | Admitting: Emergency Medicine

## 2020-04-13 ENCOUNTER — Emergency Department (HOSPITAL_COMMUNITY)
Admission: EM | Admit: 2020-04-13 | Discharge: 2020-04-13 | Disposition: A | Discharge status: Home / Self Care | Payer: BC Managed Care – PPO | Attending: Emergency Medicine | Admitting: Emergency Medicine

## 2020-04-13 ENCOUNTER — Emergency Department (HOSPITAL_COMMUNITY): Payer: BC Managed Care – PPO

## 2020-04-13 DIAGNOSIS — R7401 Elevation of levels of liver transaminase levels: Secondary | ICD-10-CM | POA: Diagnosis not present

## 2020-04-13 DIAGNOSIS — R079 Chest pain, unspecified: Secondary | ICD-10-CM | POA: Diagnosis not present

## 2020-04-13 DIAGNOSIS — K8050 Calculus of bile duct without cholangitis or cholecystitis without obstruction: Secondary | ICD-10-CM | POA: Insufficient documentation

## 2020-04-13 DIAGNOSIS — Z7951 Long term (current) use of inhaled steroids: Secondary | ICD-10-CM | POA: Diagnosis not present

## 2020-04-13 DIAGNOSIS — J45909 Unspecified asthma, uncomplicated: Secondary | ICD-10-CM | POA: Diagnosis not present

## 2020-04-13 DIAGNOSIS — Z79899 Other long term (current) drug therapy: Secondary | ICD-10-CM | POA: Diagnosis not present

## 2020-04-13 DIAGNOSIS — R109 Unspecified abdominal pain: Secondary | ICD-10-CM | POA: Diagnosis not present

## 2020-04-13 DIAGNOSIS — Z7982 Long term (current) use of aspirin: Secondary | ICD-10-CM | POA: Insufficient documentation

## 2020-04-13 DIAGNOSIS — R1010 Upper abdominal pain, unspecified: Secondary | ICD-10-CM | POA: Diagnosis not present

## 2020-04-13 DIAGNOSIS — R1011 Right upper quadrant pain: Secondary | ICD-10-CM

## 2020-04-13 DIAGNOSIS — R100 Acute abdomen: Secondary | ICD-10-CM | POA: Diagnosis not present

## 2020-04-13 DIAGNOSIS — R197 Diarrhea, unspecified: Secondary | ICD-10-CM | POA: Diagnosis not present

## 2020-04-13 LAB — COMPREHENSIVE METABOLIC PANEL
ALT: 348 U/L — ABNORMAL HIGH (ref 0–44)
AST: 860 U/L — ABNORMAL HIGH (ref 15–41)
Albumin: 4.5 g/dL (ref 3.5–5.0)
Alkaline Phosphatase: 86 U/L (ref 38–126)
Anion gap: 14 (ref 5–15)
BUN: 12 mg/dL (ref 6–20)
CO2: 23 mmol/L (ref 22–32)
Calcium: 9.6 mg/dL (ref 8.9–10.3)
Chloride: 101 mmol/L (ref 98–111)
Creatinine, Ser: 0.7 mg/dL (ref 0.44–1.00)
GFR calc Af Amer: >60 mL/min (ref >60)
GFR calc non Af Amer: >60 mL/min (ref >60)
Glucose, Bld: 100 mg/dL — ABNORMAL HIGH (ref 70–99)
Potassium: 3.9 mmol/L (ref 3.5–5.1)
Sodium: 138 mmol/L (ref 135–145)
Total Bilirubin: 1.6 mg/dL — ABNORMAL HIGH (ref 0.3–1.2)
Total Protein: 7.8 g/dL (ref 6.5–8.1)

## 2020-04-13 LAB — LIPASE, BLOOD: Lipase: 31 U/L (ref 11–51)

## 2020-04-13 LAB — URINALYSIS, ROUTINE W REFLEX MICROSCOPIC
Bilirubin Urine: NEGATIVE
Glucose, UA: NEGATIVE mg/dL
Hgb urine dipstick: NEGATIVE
Ketones, ur: 5 mg/dL — AB
Leukocytes,Ua: NEGATIVE
Nitrite: NEGATIVE
Protein, ur: NEGATIVE mg/dL
Specific Gravity, Urine: 1.018 (ref 1.005–1.030)
pH: 6 (ref 5.0–8.0)

## 2020-04-13 LAB — CBC
HCT: 39.9 % (ref 36.0–46.0)
Hemoglobin: 13.2 g/dL (ref 12.0–15.0)
MCH: 29.8 pg (ref 26.0–34.0)
MCHC: 33.1 g/dL (ref 30.0–36.0)
MCV: 90.1 fL (ref 80.0–100.0)
Platelets: 261 10*3/uL (ref 150–400)
RBC: 4.43 MIL/uL (ref 3.87–5.11)
RDW: 12.3 % (ref 11.5–15.5)
WBC: 8.6 10*3/uL (ref 4.0–10.5)
nRBC: 0 % (ref 0.0–0.2)

## 2020-04-13 LAB — I-STAT BETA HCG BLOOD, ED (MC, WL, AP ONLY): I-stat hCG, quantitative: <5 m[IU]/mL (ref <5)

## 2020-04-13 MED ORDER — SODIUM CHLORIDE 0.9 % IV BOLUS: 1000.0000 mL | Freq: Once | INTRAVENOUS | Status: DC

## 2020-04-13 MED ORDER — ONDANSETRON HCL 4 MG/2ML IJ SOLN
4.0000 mg | Freq: Once | INTRAMUSCULAR | Status: AC
  Administered 2020-04-13: 4 mg via INTRAVENOUS
  Filled 2020-04-13 (×2): qty 2

## 2020-04-13 MED ORDER — SODIUM CHLORIDE 0.9% FLUSH: 3.0000 mL | Freq: Once | INTRAVENOUS | Status: DC

## 2020-04-13 MED ORDER — ONDANSETRON 4 MG PO TBDP: ORAL_TABLET | ORAL | 0 refills | Status: DC

## 2020-04-13 MED ORDER — OXYCODONE HCL 5 MG PO TABS: 5.0000 mg | ORAL_TABLET | Freq: Four times a day (QID) | ORAL | 0 refills | Status: DC | PRN

## 2020-04-13 MED ORDER — MORPHINE SULFATE (PF) 4 MG/ML IV SOLN
4.0000 mg | Freq: Once | INTRAVENOUS | Status: AC
  Administered 2020-04-13: 4 mg via INTRAVENOUS
  Filled 2020-04-13 (×2): qty 1

## 2020-04-13 NOTE — Discharge Instructions (Signed)
Your ultrasound shows some small stones in the gallbladder, and you have elevated liver enzymes.  These will need to be rechecked in 1 week.  You can have intermittent episodes of pain, often called biliary colic in the right upper abdomen due to the stones.  You can help prevent this by following the gallbladder eating plan given to you today.  You should also avoid Tylenol and avoid drinking any alcohol.  You can use the pain and nausea medication prescribed as needed.  You will need to follow-up with GI and/or surgery for further evaluation of gallstones and elevated liver enzymes.  If you begin having severe and persistent pain, jaundice, any fevers or persistent vomiting you should return to the emergency department for reevaluation.

## 2020-04-13 NOTE — ED Provider Notes (Signed)
[facility] DEPT Provider Note   CSN: [id number] Arrival date & time: 04/13/20  1411     History Chief Complaint  Patient presents with  . Abdominal Pain  . Nausea    Stephanie Hancock is a 54 y.o. female.  Stephanie Hancock is a 54 y.o. female with a history of asthma, anemia, hyperlipidemia, uterine fibroid, migraines, who presents to the emergency department for evaluation of right upper quadrant abdominal pain.  She was seen by her PCP, Dr. Cheron Schaumann today and was sent to the ED for further evaluation with concern for gallbladder disease.  Patient states on Thursday night she started having a sudden onset episode of severe right upper quadrant abdominal pain associated with nausea, vomiting and diarrhea.  Pain eventually eased off and she was feeling better over the next 2 days but then had another episode on Sunday and has had multiple episodes today.  No associated chest pain or shortness of breath.  No fevers or chills.  No blood in the stool.  Prior to Thursday she has never had similar pain and still has her gallbladder and appendix, only abdominal surgery previously was hysterectomy.        Past Medical History:  Diagnosis Date  . Abnormal uterine bleeding   . Allergic rhinitis   . Anemia   . Asthma   . Depression    hx of  . Fibroid   . History of esophagogastroduodenoscopy (EGD) 2016   H pylori +  . Hypercholesterolemia   . Migraine    with aura  . Patient is Jehovah's Witness   . Vitamin D deficiency     Patient Active Problem List   Diagnosis Date Noted  . Migraine with aura and without status migrainosus, not intractable 12/14/2019  . Chronic migraine without aura without status migrainosus, not intractable 09/09/2017  . Ileus ([place]) 06/21/2017  . Ileus, postoperative ([place]) 06/21/2017  . Uterine fibroid 06/11/2017  . Iron deficiency anemia due to chronic blood loss 04/08/2017  . 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 Fibroids  03/31/2017    Past Surgical History:  Procedure Laterality Date  . ROBOTIC ASSISTED TOTAL HYSTERECTOMY Bilateral 06/11/2017   Procedure: XI ROBOTIC ASSISTED TOTAL HYSTERECTOMY BILATERAL SALPINGECTOMY, RIGHT OOPHERECTOMY,  LYSIS OF ADHESIONS ,MINI LAPAROTOMY;  Surgeon: Everitt Amber, MD;  Location: [facility];  Service: Gynecology;  Laterality: Bilateral;  . TUBAL LIGATION     then reversal  . UTERINE FIBROID SURGERY       OB History    Gravida  3   Para  2   Term  0   Preterm  0   AB  1   Living  2     SAB  1   TAB  0   Ectopic  0   Multiple  0   Live Births  0           Family History  Problem Relation Age of Onset  . Hypertension Mother   . Diabetes Mother   . Osteoporosis Mother   . Hypertension Father   . Prostate cancer Father   . Heart attack Father   . Kidney cancer Sister     Social History   Tobacco Use  . Smoking status: Never Smoker  . Smokeless tobacco: Never Used  Vaping Use  . Vaping Use: Never used  Substance Use Topics  . Alcohol use: Yes    Alcohol/week: 4.0 standard drinks    Types: 4 Standard drinks or equivalent per week  .  Drug use: No    Home Medications Prior to Admission medications   Medication Sig Start Date End Date Taking? Authorizing Provider  albuterol (PROVENTIL HFA;VENTOLIN HFA) 108 (90 BASE) MCG/ACT inhaler Inhale 1-2 puffs into the lungs every 6 (six) hours as needed for wheezing or shortness of breath.   Yes [provider]  albuterol (PROVENTIL) (5 MG/ML) 0.5% nebulizer solution Take 2.5 mg by nebulization every 6 (six) hours as needed for wheezing or shortness of breath.   Yes [provider]  aspirin-acetaminophen-caffeine (EXCEDRIN MIGRAINE) [phone] MG tablet Take 3 tablets by mouth 3 (three) times daily as needed for headache.    Yes [provider]  estradiol (ESTRACE) 1 MG tablet TAKE 1 TABLET(1 MG) BY MOUTH DAILY Patient taking differently: Take 1 mg by mouth every evening.   06/17/19  Yes Nunzio Cobbs, MD  fluticasone-salmeterol (ADVAIR HFA) [phone] MCG/ACT inhaler Inhale 2 puffs into the lungs 2 (two) times daily.   Yes [provider]  Galcanezumab-gnlm (EMGALITY) 120 MG/ML SOAJ Inject 120 mg into the skin every 30 (thirty) days. 12/14/19  Yes Melvenia Beam, MD  ibuprofen (ADVIL,MOTRIN) 200 MG tablet Take 400 mg by mouth every 6 (six) hours as needed for moderate pain.   Yes [provider]  Ubrogepant (UBRELVY) 100 MG TABS Take 100 mg by mouth every 2 (two) hours as needed. Maximum 200mg  a day. Patient taking differently: Take 100 mg by mouth every 2 (two) hours as needed (migraine). Maximum 200mg  a day. 12/14/19  Yes Melvenia Beam, MD  ondansetron (ZOFRAN ODT) 4 MG disintegrating tablet 4mg  ODT q4 hours prn nausea/vomit 04/13/20   Jacqlyn Larsen, PA-C  oxyCODONE (OXY IR/ROXICODONE) 5 MG immediate release tablet Take 1 tablet (5 mg total) by mouth every 6 (six) hours as needed for severe pain. 04/13/20   Jacqlyn Larsen, PA-C    Allergies    Codeine, Kiwi extract, Levetiracetam, Other, and Powder  Review of Systems   Review of Systems  Constitutional: Negative for chills and fever.  HENT: Negative.   Respiratory: Negative for cough and shortness of breath.   Cardiovascular: Negative for chest pain.  Gastrointestinal: Positive for abdominal pain, diarrhea, nausea and vomiting. Negative for blood in stool and constipation.  Genitourinary: Negative for dysuria, flank pain, frequency and hematuria.  Musculoskeletal: Negative for arthralgias and myalgias.  Skin: Negative for color change and rash.  Neurological: Negative for dizziness, syncope and light-headedness.  All other systems reviewed and are negative.   Physical Exam Updated Vital Signs BP 127/88 (BP Location: Left Arm)   Pulse 87   Temp 98.3 F (36.8 C) (Oral)   Resp 16   LMP 03/29/2017   SpO2 100%   Physical Exam Vitals and nursing note reviewed.  Constitutional:        General: She is not in acute distress.    Appearance: She is well-developed. She is not ill-appearing or diaphoretic.  HENT:     Head: Normocephalic and atraumatic.  Eyes:     General:        Right eye: No discharge.        Left eye: No discharge.     Pupils: Pupils are equal, round, and reactive to light.  Cardiovascular:     Rate and Rhythm: Normal rate and regular rhythm.     Heart sounds: Normal heart sounds. No murmur heard.  No friction rub. No gallop.   Pulmonary:     Effort: Pulmonary effort is  normal. No respiratory distress.     Breath sounds: Normal breath sounds. No wheezing or rales.     Comments: Respirations equal and unlabored, patient able to speak in full sentences, lungs clear to auscultation bilaterally Abdominal:     General: Abdomen is flat. Bowel sounds are normal. There is no distension.     Palpations: Abdomen is soft. There is no mass.     Tenderness: There is abdominal tenderness in the right upper quadrant. There is no guarding.     Comments: Abdomen is soft, nondistended, bowel sounds present throughout, patient is focally tender in the right upper quadrant, all other quadrants nontender to palpation, no peritoneal  Musculoskeletal:        General: No deformity.     Cervical back: Neck supple.  Skin:    General: Skin is warm and dry.     Capillary Refill: Capillary refill takes less than 2 seconds.  Neurological:     Mental Status: She is alert.     Coordination: Coordination normal.     Comments: Speech is clear, able to follow commands Moves extremities without ataxia, coordination intact  Psychiatric:        Mood and Affect: Mood normal.        Behavior: Behavior normal.     ED Results / Procedures / Treatments   Labs (all labs ordered are listed, but only abnormal results are displayed) Labs Reviewed  COMPREHENSIVE METABOLIC PANEL - Abnormal; Notable for the following components:      Result Value   Glucose, Bld 100 (*)    AST 860  (*)    ALT 348 (*)    Total Bilirubin 1.6 (*)    All other components within normal limits  URINALYSIS, ROUTINE W REFLEX MICROSCOPIC - Abnormal; Notable for the following components:   Ketones, ur 5 (*)    All other components within normal limits  LIPASE, BLOOD  CBC  HEPATITIS PANEL, ACUTE  I-STAT BETA HCG BLOOD, ED ([facility], [facility], [facility] ONLY)    EKG None  Radiology US Abdomen Limited RUQ  Result Date: 04/13/2020 CLINICAL DATA:  Abdomen pain EXAM: ULTRASOUND ABDOMEN LIMITED RIGHT UPPER QUADRANT COMPARISON:  CT 06/21/2017 FINDINGS: Gallbladder: Mildly shadowing echogenicity along the dependent portion of the bladder. Normal wall thickness. Negative sonographic Murphy. Common bile duct: Diameter: 2.9 mm Liver: No focal lesion identified. Within normal limits in parenchymal echogenicity. Portal vein is patent on color Doppler imaging with normal direction of blood flow towards the liver. Other: None. IMPRESSION: 1. Possible layering sandlike stones in the gallbladder. Negative for acute cholecystitis or biliary dilatation. Electronically Signed   By: Donavan Foil M.D.   On: 04/13/2020 20:19    Procedures Procedures (including critical care time)  Medications Ordered in ED Medications  sodium chloride flush (NS) 0.9 % injection 3 mL (3 mLs Intravenous Not Given 04/13/20 2034)  sodium chloride 0.9 % bolus 1,000 mL (0 mLs Intravenous Hold 04/13/20 2034)  ondansetron (ZOFRAN) injection 4 mg (4 mg Intravenous Given 04/13/20 2144)  morphine 4 MG/ML injection 4 mg (4 mg Intravenous Given 04/13/20 2145)    ED Course  I have reviewed the triage vital signs and the nursing notes.  Pertinent labs & imaging results that were available during my care of the patient were reviewed by me and considered in my medical decision making (see chart for details).    MDM Rules/Calculators/A&P  Old female sent from primary care doctor for evaluation of intermittent right upper quadrant abdominal  pain since last Thursday.  Currently pain-free and vitals are normal.  Patient describes pain concerning for biliary colic she has had some associated vomiting and diarrhea.  She is focally tender in the right upper quadrant with slight guarding.  Will get abdominal labs and right upper quadrant ultrasound.  Lab work shows no leukocytosis, normal hemoglobin, no significant electrolyte derangements, but patient does have a mildly elevated T bili of 1.6 AST of 860 and ALT of 348, normal lipase, urinalysis without any signs of infection.  Right upper quadrant ultrasound with possible layering sand-like stones in the gallbladder negative for acute cholecystitis or biliary dilatation.  Patient has not had any additional episodes of pain so far here in the ED, I discussed these results with her.  We will send acute hepatitis panel, patient does not use Tylenol.  Does drink 2-3 beers nightly.  She has normal lipase so feel she is stable for outpatient continued follow-up with GI and surgery for likely outpatient HIDA scan and close follow-up with patient's LFTs.  She expresses understanding and agreement with this plan.  Will discharge with pain and nausea medication and have also provided resources for gallbladder eating plan.  She did have one episode of pain here in the ED but after a round of pain and nausea medication this was quickly resolved and now patient is stable for discharge home.  Final Clinical Impression(s) / ED Diagnoses Final diagnoses:  RUQ abdominal pain  Biliary colic  Transaminitis    Rx / DC Orders ED Discharge Orders         Ordered    oxyCODONE (OXY IR/ROXICODONE) 5 MG immediate release tablet  Every 6 hours PRN     Discontinue  Reprint     04/13/20 2140    ondansetron (ZOFRAN ODT) 4 MG disintegrating tablet     Discontinue  Reprint     04/13/20 2140           Jacqlyn Larsen, PA-C 04/13/20 2313    Daleen Bo, MD 04/14/20 1044

## 2020-04-13 NOTE — ED Triage Notes (Signed)
Pt sent from [facility] for abd pains and nausea. Reports had vomiting last week.

## 2020-04-14 DIAGNOSIS — R7401 Elevation of levels of liver transaminase levels: Secondary | ICD-10-CM | POA: Diagnosis not present

## 2020-04-14 DIAGNOSIS — K8020 Calculus of gallbladder without cholecystitis without obstruction: Secondary | ICD-10-CM | POA: Diagnosis not present

## 2020-04-14 DIAGNOSIS — E559 Vitamin D deficiency, unspecified: Secondary | ICD-10-CM | POA: Diagnosis not present

## 2020-04-14 LAB — HEPATITIS PANEL, ACUTE
HCV Ab: NONREACTIVE
Hep A IgM: NONREACTIVE
Hep B C IgM: NONREACTIVE
Hepatitis B Surface Ag: NONREACTIVE

## 2020-04-20 DIAGNOSIS — R7401 Elevation of levels of liver transaminase levels: Secondary | ICD-10-CM | POA: Diagnosis not present

## 2020-05-03 DIAGNOSIS — K8050 Calculus of bile duct without cholangitis or cholecystitis without obstruction: Secondary | ICD-10-CM | POA: Diagnosis not present

## 2020-05-03 DIAGNOSIS — K8020 Calculus of gallbladder without cholecystitis without obstruction: Secondary | ICD-10-CM | POA: Diagnosis not present

## 2020-05-05 ENCOUNTER — Ambulatory Visit: Payer: Self-pay | Admitting: General Surgery

## 2020-05-16 ENCOUNTER — Encounter: Payer: Self-pay | Admitting: Family Medicine

## 2020-05-16 ENCOUNTER — Ambulatory Visit (INDEPENDENT_AMBULATORY_CARE_PROVIDER_SITE_OTHER): Payer: Medicare Other | Admitting: Family Medicine

## 2020-05-16 VITALS — BP 121/80 | HR 75 | Ht 61.75 in | Wt 133.0 lb

## 2020-05-16 DIAGNOSIS — G43109 Migraine with aura, not intractable, without status migrainosus: Secondary | ICD-10-CM | POA: Diagnosis not present

## 2020-05-16 NOTE — Progress Notes (Addendum)
PATIENT: Daliana Leverett DOB: Jul 17, 1966  REASON FOR VISIT: follow up HISTORY FROM: patient  Chief Complaint  Patient presents with  . Follow-up    4-6wk f/u for migraines. States her migraines have been stable.   . room 6    alone      HISTORY OF PRESENT ILLNESS: Today 05/16/20 Staceyann Knouff is a 54 y.o. female here today for follow up for migraines. She was started on Emgality in 12/2019. She has had 4 injections. Allergies (rash, whelps) with Amovig and Ajovy. She has not needed to Benadryl. She reports migraines are improved in frequency and intensity. She reports having about 8 migraines a month. Roselyn Meier helps with abortive therapy. She uses Excedrin as well and feels she uses it more often than she should. She continues discussion of weaning Estrace with PCP.    She is planning to have a cholecystectomy on 8/31. She has 12 tablets of oxycodone for post op. She does not take this medication. She is eating differently which she feels has helped. She has eliminated fried and spicy foods. She is eating more fruits. She is drinking more water.    HISTORY: (copied from Dr Cathren Laine note on 12/14/2019)  12/14/2019: We have not seen patient since 2018, here for migraines In the past she tried multiple medications (see below). She states since she saw [place] she has tried aimovig with a bad rash. The can be on either side, behind the eye, can last 24-72 hours and moderately severe to severe. Pulsating, pounding, throbbing, light and sound sensitivity, smells cam trigger and bother, she has a UV light, light can trigger even natural sunlight. 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 She has had them since the age of 14. Worsening in the last year. She takes excedrin but understands about medication overuse. They cn be severe. Sleep helps, excedrin helps. No other focal neurologic deficits, associated symptoms, inciting events or modifiable factors. Ajovy had a large rash. She has a latex allergy cannot take  aimovig.    Patient had hair loss with topiramate. Doesn't want to try Zonegran bc it has similar side effects. Medications tried: Sumatriptan po, zolmitriptan, nortriptyline, tizanidine, Keppra (hallucinations and suicidal ideation), excedrin migraine, advil. Amerge, Topamx, maxalt, steroids, Topiramate (allopecia), Zonegran (doesn't want to try bc may also have allopecia), Qudexy. By review of chart additional meds tried since being seen in 2018 include: Aimovig, excedrin, ibuprofen, imitrex injections, gabapentin, diclofenac tan relpax, reglan and ketorolac injections, zomig, tramadol, propranolol, zofran tablet.  Ajovy had a large rash. She has a latex allergy cannot take aimovig.    07/24/2017: showed No acute intracranial abnormalities including mass lesion or mass effect, hydrocephalus, extra-axial fluid collection, midline shift, hemorrhage, or acute infarction, large ischemic events (personally reviewed images).   07/2018 TSH normal   PRIOR Addendum Patient had hair loss with topiramate. Doesn't want to try Zonegran bc it has similar side effects. Medications tried: Sumatriptan, zolmitriptan, nortriptyline, tizanidine, Keppra (hallucinations and suicidal ideation), excedrin migraine, advil. Amerge, Topamx, maxalt, steroids  Medications tried: Sumatriptan, zolmitriptan, nortriptyline, tizanidine, Keppra (hallucinations and suicidal ideation), excedrin migraine, advil. Amerge, Topamx, maxalt, steroids, Topiramate (allopecia), Zonegran (doesn't want to try bc may also have allopecia), Qudexy.   HPI:  Janaysia Mcleroy is a 54 y.o. female here as a referral from Dr. Stephanie Acre for migraines.  Past medical history of asthma, migraines, anemia, uterine fibroids, rhinitis, H. pylori, vitamin D deficiency. Mother with migraines. Daughter with migraines. She has had migraines since the age  of 15. She used to get an aura, "bouncy orbs or black and white lines", no auras now. She takes excedrin  excessively, +overuse. No aura. Daily headaches, she has 14 migrainous a month for years and can last all day. If she didn't treat she would have daily migraines. They can be unilateral, pounding, throbbing, behind the eye, pain in the left temple, nausea, vomiting, light and sound sensitivity. Lights aggravate. Sitting still helps in a dark room. Weather changes with aggravate her migraines.   Medications tried: Sumatriptan, zolmitriptan, nortriptyline, tizanidine, Keppra (hallucinations and suicidal ideation), excedrin migraine, advil. Amerge, Topamx, maxalt, steroids   Reviewed notes, labs and imaging from outside physicians, which showed:  MRI brain 07/2017 showed No acute intracranial abnormalities including mass lesion or mass effect, hydrocephalus, extra-axial fluid collection, midline shift, hemorrhage, or acute infarction, large ischemic events (personally reviewed images)  CBC with anemia 11.2/33.5, BMP elevated glusoce. 06/2017.  Medications tried: Sumatriptan, zolmitriptan, nortriptyline, tizanidine.  Patient was being seen by the [facility] and he retired.   REVIEW OF SYSTEMS: Out of a complete 14 system review of symptoms, the patient complains only of the following symptoms, headaches, hot flashes and all other reviewed systems are negative.  ALLERGIES: Allergies  Allergen Reactions  . Codeine Hives  . Kiwi Extract Swelling and Other (See Comments)    Tingling in throat/mouth swells.  . Levetiracetam Other (See Comments)    HALLUCINATIONS W/SUICIDAL IDEATION  . Other     NO BLOOD PRODUCTS   . Powder Itching and Other (See Comments)    LATEX GLOVES WITH POWDER CAUSES ITCHING    HOME MEDICATIONS: Outpatient Medications Prior to Visit  Medication Sig Dispense Refill  . albuterol (PROVENTIL HFA;VENTOLIN HFA) 108 (90 BASE) MCG/ACT inhaler Inhale 1-2 puffs into the lungs every 6 (six) hours as needed for wheezing or shortness of breath.    Marland Kitchen albuterol  (PROVENTIL) (5 MG/ML) 0.5% nebulizer solution Take 2.5 mg by nebulization every 6 (six) hours as needed for wheezing or shortness of breath.    Marland Kitchen aspirin-acetaminophen-caffeine (EXCEDRIN MIGRAINE) 250-250-65 MG tablet Take 3 tablets by mouth 3 (three) times daily as needed for headache.     . estradiol (ESTRACE) 1 MG tablet TAKE 1 TABLET(1 MG) BY MOUTH DAILY (Patient taking differently: Take 1 mg by mouth every evening. ) 90 tablet 3  . fluticasone-salmeterol (ADVAIR HFA) 45-21 MCG/ACT inhaler Inhale 2 puffs into the lungs 2 (two) times daily.    . Galcanezumab-gnlm (EMGALITY) 120 MG/ML SOAJ Inject 120 mg into the skin every 30 (thirty) days. 1 pen 11  . ondansetron (ZOFRAN ODT) 4 MG disintegrating tablet 4mg  ODT q4 hours prn nausea/vomit 10 tablet 0  . oxyCODONE (OXY IR/ROXICODONE) 5 MG immediate release tablet Take 1 tablet (5 mg total) by mouth every 6 (six) hours as needed for severe pain. 12 tablet 0  . Ubrogepant (UBRELVY) 100 MG TABS Take 100 mg by mouth every 2 (two) hours as needed. Maximum 200mg  a day. (Patient taking differently: Take 100 mg by mouth every 2 (two) hours as needed (migraine). Maximum 200mg  a day.) 10 tablet 6  . ibuprofen (ADVIL,MOTRIN) 200 MG tablet Take 400 mg by mouth every 6 (six) hours as needed for moderate pain.     No facility-administered medications prior to visit.    PAST MEDICAL HISTORY: Past Medical History:  Diagnosis Date  . Abnormal uterine bleeding   . Allergic rhinitis   . Anemia   . Asthma   . Depression  hx of  . Fibroid   . History of esophagogastroduodenoscopy (EGD) 2016   H pylori +  . Hypercholesterolemia   . Migraine    with aura  . Patient is Jehovah's Witness   . Vitamin D deficiency     PAST SURGICAL HISTORY: Past Surgical History:  Procedure Laterality Date  . ROBOTIC ASSISTED TOTAL HYSTERECTOMY Bilateral 06/11/2017   Procedure: XI ROBOTIC ASSISTED TOTAL HYSTERECTOMY BILATERAL SALPINGECTOMY, RIGHT OOPHERECTOMY,  LYSIS OF  ADHESIONS ,MINI LAPAROTOMY;  Surgeon: Everitt Amber, MD;  Location: [facility];  Service: Gynecology;  Laterality: Bilateral;  . TUBAL LIGATION     then reversal  . UTERINE FIBROID SURGERY      FAMILY HISTORY: Family History  Problem Relation Age of Onset  . Hypertension Mother   . Diabetes Mother   . Osteoporosis Mother   . Hypertension Father   . Prostate cancer Father   . Heart attack Father   . Kidney cancer Sister     SOCIAL HISTORY: Social History   Socioeconomic History  . Marital status: Married    Spouse name: Not on file  . Number of children: 2  . Years of education: Not on file  . Highest education level: Some college, no degree  Occupational History  . Not on file  Tobacco Use  . Smoking status: Never Smoker  . Smokeless tobacco: Never Used  Vaping Use  . Vaping Use: Never used  Substance and Sexual Activity  . Alcohol use: Yes    Alcohol/week: 4.0 standard drinks    Types: 4 Standard drinks or equivalent per week  . Drug use: No  . Sexual activity: Yes    Birth control/protection: Surgical    Comment: hysterectomy  Other Topics Concern  . Not on file  Social History Narrative   Lives at home with husband   Caffeine: 1 cup coffee every other day   Right handed predominantly, uses both hands      Social Determinants of Health   Financial Resource Strain:   . Difficulty of Paying Living Expenses:   Food Insecurity:   . Worried About Charity fundraiser in the Last Year:   . Arboriculturist in the Last Year:   Transportation Needs:   . Film/video editor (Medical):   Marland Kitchen Lack of Transportation (Non-Medical):   Physical Activity:   . Days of Exercise per Week:   . Minutes of Exercise per Session:   Stress:   . Feeling of Stress :   Social Connections:   . Frequency of Communication with Friends and Family:   . Frequency of Social Gatherings with Friends and Family:   . Attends Religious Services:   . Active Member of Clubs or Organizations:    . Attends Archivist Meetings:   Marland Kitchen Marital Status:   Intimate Partner Violence:   . Fear of Current or Ex-Partner:   . Emotionally Abused:   Marland Kitchen Physically Abused:   . Sexually Abused:       PHYSICAL EXAM  Vitals:   05/16/20 0859  BP: 121/80  Pulse: 75  Weight: 133 lb (60.3 kg)  Height: 5' 1.75" (1.568 m)   Body mass index is 24.52 kg/m.  Generalized: Well developed, in no acute distress  Cardiology: normal rate and rhythm, no murmur noted Respiratory: clear to auscultation bilaterally  Neurological examination  Mentation: Alert oriented to time, place, history taking. Follows all commands speech and language fluent Cranial nerve II-XII: Pupils were equal round reactive  to light. Extraocular movements were full, visual field were full  Motor: The motor testing reveals 5 over 5 strength of all 4 extremities. Good symmetric motor tone is noted throughout.  Gait and station: Gait is normal.    DIAGNOSTIC DATA (LABS, IMAGING, TESTING) - I reviewed patient records, labs, notes, testing and imaging myself where available.  No flowsheet data found.   Lab Results  Component Value Date   WBC 8.6 04/13/2020   HGB 13.2 04/13/2020   HCT 39.9 04/13/2020   MCV 90.1 04/13/2020   PLT 261 04/13/2020      Component Value Date/Time   NA 138 04/13/2020 1453   NA 140 04/04/2017 1636   K 3.9 04/13/2020 1453   CL 101 04/13/2020 1453   CO2 23 04/13/2020 1453   GLUCOSE 100 (H) 04/13/2020 1453   BUN 12 04/13/2020 1453   BUN 14 04/04/2017 1636   CREATININE 0.70 04/13/2020 1453   CALCIUM 9.6 04/13/2020 1453   PROT 7.8 04/13/2020 1453   ALBUMIN 4.5 04/13/2020 1453   AST 860 (H) 04/13/2020 1453   ALT 348 (H) 04/13/2020 1453   ALKPHOS 86 04/13/2020 1453   BILITOT 1.6 (H) 04/13/2020 1453   GFRNONAA >60 04/13/2020 1453   GFRAA >60 04/13/2020 1453   No results found for: CHOL, HDL, LDLCALC, LDLDIRECT, TRIG, CHOLHDL No results found for: HGBA1C No results found for:  VITAMINB12 Lab Results  Component Value Date   TSH 2.520 07/23/2018       ASSESSMENT AND PLAN 54 y.o. year old female  has a past medical history of Abnormal uterine bleeding, Allergic rhinitis, Anemia, Asthma, Depression, Fibroid, History of esophagogastroduodenoscopy (EGD) (2016), Hypercholesterolemia, Migraine, Patient is Jehovah's Witness, and Vitamin D deficiency. here with     ICD-10-CM   1. Migraine with aura and without status migrainosus, not intractable  G43.109     Joeanne is doing much better. She reports that she was previously having 18 migraines per month and now having about 8 per month. We will continue Emgality. She denies adverse reactions. She will also continue Ubrelvy for abortive therapy. She was encouraged to limit Excedrin use to 1-2 times a week if possible due to potential for rebound headaches. She will continue healthy lifestyle habits. I have commended her on healthy diet changes. She will focus on staying well hydrated. She will call with worsening headaches. She has tried and failed multiple other medications. May consider amitriptyline at night for worsening. Could also consider Botox. She will follow up with me in 6 months, sooner if needed. She verbalizes understanding and agreement with this plan.    No orders of the defined types were placed in this encounter.    No orders of the defined types were placed in this encounter.     I spent 20 minutes with the patient. 50% of this time was spent counseling and educating patient on plan of care and medications.    Debbora Presto, FNP-C 05/16/2020, 9:27 AM [facility] [address] [place], [place] [address] [phone]  Made any corrections needed, and agree with history, physical, neuro exam,assessment and plan as stated.     Sarina Ill, MD [facility]

## 2020-05-16 NOTE — Patient Instructions (Addendum)
We will continue Emgality injections every month. Continue Ubrelvy as needed for abortive therapy.   Continue healthy lifestyle habits. Stay well hydrated. Continue discussion of weaning Estrace with PCP.   Follow up in 6 months    Migraine Headache A migraine headache is a very strong throbbing pain on one side or both sides of your head. This type of headache can also cause other symptoms. It can last from 4 hours to 3 days. Talk with your doctor about what things may bring on (trigger) this condition. What are the causes? The exact cause of this condition is not known. This condition may be triggered or caused by:  Drinking alcohol.  Smoking.  Taking medicines, such as: ? Medicine used to treat chest pain (nitroglycerin). ? Birth control pills. ? Estrogen. ? Some blood pressure medicines.  Eating or drinking certain products.  Doing physical activity. Other things that may trigger a migraine headache include:  Having a menstrual period.  Pregnancy.  Hunger.  Stress.  Not getting enough sleep or getting too much sleep.  Weather changes.  Tiredness (fatigue). What increases the risk?  Being 5-90 years old.  Being female.  Having a family history of migraine headaches.  Being Caucasian.  Having depression or anxiety.  Being very overweight. What are the signs or symptoms?  A throbbing pain. This pain may: ? Happen in any area of the head, such as on one side or both sides. ? Make it hard to do daily activities. ? Get worse with physical activity. ? Get worse around bright lights or loud noises.  Other symptoms may include: ? Feeling sick to your stomach (nauseous). ? Vomiting. ? Dizziness. ? Being sensitive to bright lights, loud noises, or smells.  Before you get a migraine headache, you may get warning signs (an aura). An aura may include: ? Seeing flashing lights or having blind spots. ? Seeing bright spots, halos, or zigzag lines. ? Having  tunnel vision or blurred vision. ? Having numbness or a tingling feeling. ? Having trouble talking. ? Having weak muscles.  Some people have symptoms after a migraine headache (postdromal phase), such as: ? Tiredness. ? Trouble thinking (concentrating). How is this treated?  Taking medicines that: ? Relieve pain. ? Relieve the feeling of being sick to your stomach. ? Prevent migraine headaches.  Treatment may also include: ? Having acupuncture. ? Avoiding foods that bring on migraine headaches. ? Learning ways to control your body functions (biofeedback). ? Therapy to help you know and deal with negative thoughts (cognitive behavioral therapy). Follow these instructions at home: Medicines  Take over-the-counter and prescription medicines only as told by your doctor.  Ask your doctor if the medicine prescribed to you: ? Requires you to avoid driving or using heavy machinery. ? Can cause trouble pooping (constipation). You may need to take these steps to prevent or treat trouble pooping:  Drink enough fluid to keep your pee (urine) pale yellow.  Take over-the-counter or prescription medicines.  Eat foods that are high in fiber. These include beans, whole grains, and fresh fruits and vegetables.  Limit foods that are high in fat and sugar. These include fried or sweet foods. Lifestyle  Do not drink alcohol.  Do not use any products that contain nicotine or tobacco, such as cigarettes, e-cigarettes, and chewing tobacco. If you need help quitting, ask your doctor.  Get at least 8 hours of sleep every night.  Limit and deal with stress. General instructions  Keep a journal to find out what may bring on your migraine headaches. For example, write down: ? What you eat and drink. ? How much sleep you get. ? Any change in what you eat or drink. ? Any change in your medicines.  If you have a migraine headache: ? Avoid things that make your symptoms worse, such as  bright lights. ? It may help to lie down in a dark, quiet room. ? Do not drive or use heavy machinery. ? Ask your doctor what activities are safe for you.  Keep all follow-up visits as told by your doctor. This is important. Contact a doctor if:  You get a migraine headache that is different or worse than others you have had.  You have more than 15 headache days in one month. Get help right away if:  Your migraine headache gets very bad.  Your migraine headache lasts longer than 72 hours.  You have a fever.  You have a stiff neck.  You have trouble seeing.  Your muscles feel weak or like you cannot control them.  You start to lose your balance a lot.  You start to have trouble walking.  You pass out (faint).  You have a seizure. Summary  A migraine headache is a very strong throbbing pain on one side or both sides of your head. These headaches can also cause other symptoms.  This condition may be treated with medicines and changes to your lifestyle.  Keep a journal to find out what may bring on your migraine headaches.  Contact a doctor if you get a migraine headache that is different or worse than others you have had.  Contact your doctor if you have more than 15 headache days in a month. This information is not intended to replace advice given to you by your health care provider. Make sure you discuss any questions you have with your health care provider. Document Revised: 01/16/2019 Document Reviewed: 11/06/2018 Elsevier Patient Education  [place].

## 2020-05-30 DIAGNOSIS — Z8616 Personal history of COVID-19: Secondary | ICD-10-CM

## 2020-05-30 HISTORY — DX: Personal history of COVID-19: Z86.16

## 2020-06-01 NOTE — Progress Notes (Signed)
[facility] [address], [address] [place] [address] Phone: [phone] Fax: [phone]      Your procedure is scheduled on 06/07/20.  Report to [facility] Main Entrance "A" at 5:30 A.M., and check in at the Admitting office.  Call this number if you have problems the morning of surgery:  [phone]  Call [phone] if you have any questions prior to your surgery date Monday-Friday 8am-4pm    Remember:  Do not eat after midnight the night before your surgery  You may drink clear liquids until 4:30 the morning of your surgery.   Clear liquids allowed are: Water, Non-Citrus Juices (without pulp), Carbonated Beverages, Clear Tea, Black Coffee Only, and Gatorade  Please complete your PRE-SURGERY ENSURE that was provided to you by 4:30 the morning of surgery.  Please, if able, drink it in one setting. DO NOT SIP.    Take these medicines the morning of surgery with A SIP OF WATER: fluticasone-salmeterol (ADVAIR HFA)  mometasone-formoterol (DULERA)  albuterol (PROVENTIL HFA;VENTOLIN HFA) - as needed albuterol (PROVENTIL) -nebulizer solution as needed ondansetron (ZOFRAN ODT) - as needed oxyCODONE (OXY IR/ROXICODONE) - as needed Ubrogepant (UBRELVY) - as needed for migraine  As of today, STOP taking any Aspirin (unless otherwise instructed by your surgeon) Aleve, Naproxen, Ibuprofen, Motrin, Advil, Goody's, BC's, all herbal medications, fish oil, and all vitamins.                      Do not wear jewelry, make up, or nail polish            Do not wear lotions, powders, perfumes or deodorant.            Do not shave 48 hours prior to surgery.            Do not bring valuables to the hospital.            [facility] is not responsible for any belongings or valuables.  Do NOT Smoke (Tobacco/Vaping) or drink Alcohol 24 hours prior to your procedure If you use a CPAP at night, you may bring all  equipment for your overnight stay.   Contacts, glasses, dentures or bridgework may not be worn into surgery.      For patients admitted to the hospital, discharge time will be determined by your treatment team.   Patients discharged the day of surgery will not be allowed to drive home, and someone needs to stay with them for 24 hours.    Special instructions:   [facility]- Preparing For Surgery  Before surgery, you can play an important role. Because skin is not sterile, your skin needs to be as free of germs as possible. You can reduce the number of germs on your skin by washing with CHG (chlorahexidine gluconate) Soap before surgery.  CHG is an antiseptic cleaner which kills germs and bonds with the skin to continue killing germs even after washing.    Oral Hygiene is also important to reduce your risk of infection.  Remember - BRUSH YOUR TEETH THE MORNING OF SURGERY WITH YOUR REGULAR TOOTHPASTE  Please do not use if you have an allergy to CHG or antibacterial soaps. If your skin becomes reddened/irritated stop using the CHG.  Do not shave (including legs and underarms) for at least 48 hours prior to first CHG shower. It is OK to shave your face.  Please  follow these instructions carefully.   1. Shower the NIGHT BEFORE SURGERY and the MORNING OF SURGERY with CHG Soap.   2. If you chose to wash your hair, wash your hair first as usual with your normal shampoo.  3. After you shampoo, rinse your hair and body thoroughly to remove the shampoo.  4. Use CHG as you would any other liquid soap. You can apply CHG directly to the skin and wash gently with a scrungie or a clean washcloth.   5. Apply the CHG Soap to your body ONLY FROM THE NECK DOWN.  Do not use on open wounds or open sores. Avoid contact with your eyes, ears, mouth and genitals (private parts). Wash Face and genitals (private parts)  with your normal soap.   6. Wash thoroughly, paying special attention to the area where your  surgery will be performed.  7. Thoroughly rinse your body with warm water from the neck down.  8. DO NOT shower/wash with your normal soap after using and rinsing off the CHG Soap.  9. Pat yourself dry with a CLEAN TOWEL.  10. Wear CLEAN PAJAMAS to bed the night before surgery  11. Place CLEAN SHEETS on your bed the night of your first shower and DO NOT SLEEP WITH PETS.   Day of Surgery: Wear Clean/Comfortable clothing the morning of surgery Do not apply any deodorants/lotions.   Remember to brush your teeth WITH YOUR REGULAR TOOTHPASTE.   Please read over the following fact sheets that you were given.

## 2020-06-02 ENCOUNTER — Encounter (HOSPITAL_COMMUNITY): Payer: Self-pay

## 2020-06-02 ENCOUNTER — Other Ambulatory Visit: Payer: Self-pay

## 2020-06-02 ENCOUNTER — Encounter (HOSPITAL_COMMUNITY)
Admission: RE | Admit: 2020-06-02 | Discharge: 2020-06-02 | Disposition: A | Discharge status: Home / Self Care | Payer: BC Managed Care – PPO | Source: Ambulatory Visit | Attending: General Surgery | Admitting: General Surgery

## 2020-06-02 DIAGNOSIS — Z01812 Encounter for preprocedural laboratory examination: Secondary | ICD-10-CM | POA: Insufficient documentation

## 2020-06-02 LAB — COMPREHENSIVE METABOLIC PANEL
ALT: 28 U/L (ref 0–44)
AST: 22 U/L (ref 15–41)
Albumin: 4 g/dL (ref 3.5–5.0)
Alkaline Phosphatase: 72 U/L (ref 38–126)
Anion gap: 7 (ref 5–15)
BUN: 10 mg/dL (ref 6–20)
CO2: 27 mmol/L (ref 22–32)
Calcium: 9.4 mg/dL (ref 8.9–10.3)
Chloride: 105 mmol/L (ref 98–111)
Creatinine, Ser: 0.74 mg/dL (ref 0.44–1.00)
GFR calc Af Amer: >60 mL/min (ref >60)
GFR calc non Af Amer: >60 mL/min (ref >60)
Glucose, Bld: 95 mg/dL (ref 70–99)
Potassium: 3.9 mmol/L (ref 3.5–5.1)
Sodium: 139 mmol/L (ref 135–145)
Total Bilirubin: 0.3 mg/dL (ref 0.3–1.2)
Total Protein: 7.2 g/dL (ref 6.5–8.1)

## 2020-06-02 LAB — CBC
HCT: 38.5 % (ref 36.0–46.0)
Hemoglobin: 12.3 g/dL (ref 12.0–15.0)
MCH: 29.1 pg (ref 26.0–34.0)
MCHC: 31.9 g/dL (ref 30.0–36.0)
MCV: 91 fL (ref 80.0–100.0)
Platelets: 225 10*3/uL (ref 150–400)
RBC: 4.23 MIL/uL (ref 3.87–5.11)
RDW: 11.6 % (ref 11.5–15.5)
WBC: 3.2 10*3/uL — ABNORMAL LOW (ref 4.0–10.5)
nRBC: 0 % (ref 0.0–0.2)

## 2020-06-02 NOTE — Progress Notes (Signed)
PCP - Dr. Jonathon Jordan Cardiologist - Denies Neurologist: Dr. Sarina Ill  PPM/ICD - Denies  Chest x-ray - N/A EKG - N/A Stress Test - Denies ECHO - Denies Cardiac Cath - Denies  Sleep Study - Denies  Pt denies being diabetic.   Blood Thinner Instructions: Aspirin Instructions:  ERAS Protcol - Yes PRE-SURGERY Ensure  COVID TEST- 06/03/20   Anesthesia review: No  Patient denies shortness of breath, fever, cough and chest pain at PAT appointment   All instructions explained to the patient, with a verbal understanding of the material. Patient agrees to go over the instructions while at home for a better understanding. Patient also instructed to self quarantine after being tested for COVID-19. The opportunity to ask questions was provided.

## 2020-06-03 ENCOUNTER — Other Ambulatory Visit (HOSPITAL_COMMUNITY)
Admission: RE | Admit: 2020-06-03 | Discharge: 2020-06-03 | Disposition: A | Discharge status: Home / Self Care | Payer: BC Managed Care – PPO | Source: Ambulatory Visit | Attending: General Surgery | Admitting: General Surgery

## 2020-06-03 DIAGNOSIS — U071 COVID-19: Secondary | ICD-10-CM | POA: Insufficient documentation

## 2020-06-03 DIAGNOSIS — Z01812 Encounter for preprocedural laboratory examination: Secondary | ICD-10-CM | POA: Diagnosis not present

## 2020-06-03 LAB — SARS CORONAVIRUS 2 (TAT 6-24 HRS): SARS Coronavirus 2: POSITIVE — AB

## 2020-06-04 ENCOUNTER — Telehealth: Payer: Self-pay | Admitting: Nurse Practitioner

## 2020-06-04 NOTE — Telephone Encounter (Signed)
Called to discuss with Judie Bonus about Covid symptoms and the use of casirivimab/imdevimab, a combination monoclonal antibody infusion for those with mild to moderate Covid symptoms and at a high risk of hospitalization.     Pt does not qualify for infusion therapy as she has asymptomatic infection. Isolation precautions discussed. Advised to contact back for consideration should she develop symptoms. Patient verbalized understanding.    Patient Active Problem List   Diagnosis Date Noted  . Migraine with aura and without status migrainosus, not intractable 12/14/2019  . Chronic migraine without aura without status migrainosus, not intractable 09/09/2017  . Ileus ([place]) 06/21/2017  . Ileus, postoperative ([place]) 06/21/2017  . Uterine fibroid 06/11/2017  . Iron deficiency anemia due to chronic blood loss 04/08/2017  . 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 Fibroids 03/31/2017    Alda Lea, AGPCNP-BC

## 2020-06-06 ENCOUNTER — Other Ambulatory Visit: Payer: Self-pay | Admitting: Neurology

## 2020-06-06 NOTE — Progress Notes (Signed)
Surgeons office notified of patients positive COVID result

## 2020-06-07 ENCOUNTER — Encounter (HOSPITAL_COMMUNITY): Admission: RE | Payer: Self-pay | Source: Home / Self Care

## 2020-06-07 ENCOUNTER — Ambulatory Visit (HOSPITAL_COMMUNITY): Admission: RE | Admit: 2020-06-07 | Payer: BC Managed Care – PPO | Source: Home / Self Care | Admitting: General Surgery

## 2020-06-07 SURGERY — LAPAROSCOPIC CHOLECYSTECTOMY WITH INTRAOPERATIVE CHOLANGIOGRAM
Anesthesia: General

## 2020-06-11 ENCOUNTER — Other Ambulatory Visit: Payer: Self-pay | Admitting: Obstetrics and Gynecology

## 2020-06-14 NOTE — Telephone Encounter (Signed)
Medication refill request: Estradiol  Last AEX:  06-17-2019 BS  Next AEX: 08-03-20  Last MMG (if hormonal medication request): 06-19-2019 density B/BIRADS 1 negative  Refill authorized: Today, please advise.   Medication pended for #90, 0RF. Please refill if appropriate.

## 2020-06-24 ENCOUNTER — Encounter: Payer: Self-pay | Admitting: Obstetrics and Gynecology

## 2020-06-24 DIAGNOSIS — Z1231 Encounter for screening mammogram for malignant neoplasm of breast: Secondary | ICD-10-CM | POA: Diagnosis not present

## 2020-07-14 NOTE — Progress Notes (Signed)
Your procedure is scheduled on Friday October 15.  Report to [facility] Main Entrance "A" at 05:30 A.M., and check in at the Admitting office.  Call this number if you have problems the morning of surgery: [phone]  Call [phone] if you have any questions prior to your surgery date Monday-Friday 8am-4pm   Remember: Do not eat after midnight the night before your surgery  You may drink clear liquids until 04:30 A.M. the morning of your surgery.   Clear liquids allowed are: Water, Non-Citrus Juices (without pulp), Carbonated Beverages, Clear Tea, Black Coffee Only, and Gatorade   Take these medicines the morning of surgery with A SIP OF WATER: fluticasone-salmeterol (ADVAIR HFA) inhaler ---   If needed: albuterol (PROVENTIL HFA;VENTOLIN HFA) inhaler  --- Please bring all inhalers with you the day of surgery.  albuterol (PROVENTIL) nebulizer   As of today, STOP taking any Aspirin (unless otherwise instructed by your surgeon), Aleve, Naproxen, Ibuprofen, Motrin, Advil, Goody's, BC's, all herbal medications, fish oil, and all vitamins.    The Morning of Surgery  Do not wear jewelry, make-up or nail polish.  Do not wear lotions, powders, or perfumes, or deodorant  Do not shave 48 hours prior to surgery.   Do not bring valuables to the hospital.  [facility] is not responsible for any belongings or valuables.  If you are a smoker, DO NOT Smoke 24 hours prior to surgery  If you wear a CPAP at night please bring your mask the morning of surgery   Remember that you must have someone to transport you home after your surgery, and remain with you for 24 hours if you are discharged the same day.   Please bring cases for contacts, glasses, hearing aids, dentures or bridgework because it cannot be worn into surgery.    Leave your suitcase in the car.  After surgery it may be brought to your room.  For patients admitted to the hospital, discharge time will be determined by your  treatment team.  Patients discharged the day of surgery will not be allowed to drive home.    Special instructions:   [place]- Preparing For Surgery  Before surgery, you can play an important role. Because skin is not sterile, your skin needs to be as free of germs as possible. You can reduce the number of germs on your skin by washing with CHG (chlorahexidine gluconate) Soap before surgery.  CHG is an antiseptic cleaner which kills germs and bonds with the skin to continue killing germs even after washing.    Oral Hygiene is also important to reduce your risk of infection.  Remember - BRUSH YOUR TEETH THE MORNING OF SURGERY WITH YOUR REGULAR TOOTHPASTE  Please do not use if you have an allergy to CHG or antibacterial soaps. If your skin becomes reddened/irritated stop using the CHG.  Do not shave (including legs and underarms) for at least 48 hours prior to first CHG shower. It is OK to shave your face.  Please follow these instructions carefully.   1. Shower the NIGHT BEFORE SURGERY and the MORNING OF SURGERY with CHG Soap.   2. If you chose to wash your hair and body, wash as usual with your normal shampoo and body-wash/soap.  3. Rinse your hair and body thoroughly to remove the shampoo and soap.  4. Apply CHG directly to the skin (ONLY FROM THE NECK DOWN) and wash gently with a scrungie or a clean washcloth.   5. Do not use  on open wounds or open sores. Avoid contact with your eyes, ears, mouth and genitals (private parts). Wash Face and genitals (private parts)  with your normal soap.   6. Wash thoroughly, paying special attention to the area where your surgery will be performed.  7. Thoroughly rinse your body with warm water from the neck down.  8. DO NOT shower/wash with your normal soap after using and rinsing off the CHG Soap.  9. Pat yourself dry with a CLEAN TOWEL.  10. Wear CLEAN PAJAMAS to bed the night before surgery  11. Place CLEAN SHEETS on your bed the night  of your first shower and DO NOT SLEEP WITH PETS.  12. Wear comfortable clothes the morning of surgery.     Day of Surgery:  Please shower the morning of surgery with the CHG soap Do not apply any deodorants/lotions. Please wear clean clothes to the hospital/surgery center.   Remember to brush your teeth WITH YOUR REGULAR TOOTHPASTE.   Please read over the following fact sheets that you were given.

## 2020-07-15 ENCOUNTER — Encounter (HOSPITAL_COMMUNITY)
Admission: RE | Admit: 2020-07-15 | Discharge: 2020-07-15 | Disposition: A | Discharge status: Home / Self Care | Payer: BC Managed Care – PPO | Source: Ambulatory Visit | Attending: General Surgery | Admitting: General Surgery

## 2020-07-15 ENCOUNTER — Other Ambulatory Visit: Payer: Self-pay

## 2020-07-15 ENCOUNTER — Encounter (HOSPITAL_COMMUNITY): Payer: Self-pay

## 2020-07-15 DIAGNOSIS — Z01812 Encounter for preprocedural laboratory examination: Secondary | ICD-10-CM | POA: Insufficient documentation

## 2020-07-15 LAB — CBC
HCT: 38.5 % (ref 36.0–46.0)
Hemoglobin: 12.6 g/dL (ref 12.0–15.0)
MCH: 30.1 pg (ref 26.0–34.0)
MCHC: 32.7 g/dL (ref 30.0–36.0)
MCV: 91.9 fL (ref 80.0–100.0)
Platelets: 267 10*3/uL (ref 150–400)
RBC: 4.19 MIL/uL (ref 3.87–5.11)
RDW: 12 % (ref 11.5–15.5)
WBC: 4.8 10*3/uL (ref 4.0–10.5)
nRBC: 0 % (ref 0.0–0.2)

## 2020-07-15 LAB — BASIC METABOLIC PANEL
Anion gap: 9 (ref 5–15)
BUN: 11 mg/dL (ref 6–20)
CO2: 25 mmol/L (ref 22–32)
Calcium: 9.5 mg/dL (ref 8.9–10.3)
Chloride: 106 mmol/L (ref 98–111)
Creatinine, Ser: 0.76 mg/dL (ref 0.44–1.00)
GFR calc non Af Amer: >60 mL/min (ref >60)
Glucose, Bld: 97 mg/dL (ref 70–99)
Potassium: 3.9 mmol/L (ref 3.5–5.1)
Sodium: 140 mmol/L (ref 135–145)

## 2020-07-15 NOTE — Progress Notes (Signed)
PCP:  Jonathon Jordan, MD Cardiologist:  Denies  EKG:  N/A CXR:  N/A ECHO:  Denies Stress Test:  Denies Cardiac Cath:  Denies  Pt. Covid positive 06/03/20  Patient denies shortness of breath, fever, cough, and chest pain at PAT appointment.  Patient verbalized understanding of instructions provided today at the PAT appointment.  Patient asked to review instructions at home and day of surgery.

## 2020-07-19 ENCOUNTER — Other Ambulatory Visit (HOSPITAL_COMMUNITY): Payer: BC Managed Care – PPO

## 2020-07-22 ENCOUNTER — Ambulatory Visit (HOSPITAL_COMMUNITY): Payer: BC Managed Care – PPO | Admitting: Anesthesiology

## 2020-07-22 ENCOUNTER — Encounter (HOSPITAL_COMMUNITY)
Admission: RE | Disposition: A | Discharge status: Home / Self Care | Payer: Self-pay | Source: Home / Self Care | Attending: General Surgery

## 2020-07-22 ENCOUNTER — Ambulatory Visit (HOSPITAL_COMMUNITY)
Admission: RE | Admit: 2020-07-22 | Discharge: 2020-07-22 | Disposition: A | Discharge status: Home / Self Care | Payer: BC Managed Care – PPO | Attending: General Surgery | Admitting: General Surgery

## 2020-07-22 ENCOUNTER — Other Ambulatory Visit: Payer: Self-pay

## 2020-07-22 ENCOUNTER — Encounter (HOSPITAL_COMMUNITY): Payer: Self-pay | Admitting: General Surgery

## 2020-07-22 DIAGNOSIS — K8010 Calculus of gallbladder with chronic cholecystitis without obstruction: Secondary | ICD-10-CM | POA: Insufficient documentation

## 2020-07-22 DIAGNOSIS — F32A Depression, unspecified: Secondary | ICD-10-CM | POA: Diagnosis not present

## 2020-07-22 DIAGNOSIS — Z7951 Long term (current) use of inhaled steroids: Secondary | ICD-10-CM | POA: Insufficient documentation

## 2020-07-22 DIAGNOSIS — G43709 Chronic migraine without aura, not intractable, without status migrainosus: Secondary | ICD-10-CM | POA: Diagnosis not present

## 2020-07-22 DIAGNOSIS — Z79890 Hormone replacement therapy (postmenopausal): Secondary | ICD-10-CM | POA: Diagnosis not present

## 2020-07-22 DIAGNOSIS — G43909 Migraine, unspecified, not intractable, without status migrainosus: Secondary | ICD-10-CM | POA: Diagnosis not present

## 2020-07-22 DIAGNOSIS — K8020 Calculus of gallbladder without cholecystitis without obstruction: Secondary | ICD-10-CM | POA: Diagnosis not present

## 2020-07-22 DIAGNOSIS — D500 Iron deficiency anemia secondary to blood loss (chronic): Secondary | ICD-10-CM | POA: Diagnosis not present

## 2020-07-22 DIAGNOSIS — Z8616 Personal history of COVID-19: Secondary | ICD-10-CM | POA: Insufficient documentation

## 2020-07-22 DIAGNOSIS — J45909 Unspecified asthma, uncomplicated: Secondary | ICD-10-CM | POA: Diagnosis not present

## 2020-07-22 HISTORY — PX: CHOLECYSTECTOMY: SHX55

## 2020-07-22 SURGERY — LAPAROSCOPIC CHOLECYSTECTOMY WITH INTRAOPERATIVE CHOLANGIOGRAM
Anesthesia: General | Site: Abdomen

## 2020-07-22 MED ORDER — CEFAZOLIN SODIUM-DEXTROSE 2-4 GM/100ML-% IV SOLN
2.0000 g | INTRAVENOUS | Status: AC
  Administered 2020-07-22: 2 g via INTRAVENOUS
  Filled 2020-07-22: qty 100

## 2020-07-22 MED ORDER — OXYCODONE HCL 5 MG PO TABS
ORAL_TABLET | ORAL | Status: AC
  Filled 2020-07-22: qty 1

## 2020-07-22 MED ORDER — PROPOFOL 10 MG/ML IV BOLUS
INTRAVENOUS | Status: DC | PRN
  Administered 2020-07-22: 180 mg via INTRAVENOUS

## 2020-07-22 MED ORDER — LIDOCAINE 2% (20 MG/ML) 5 ML SYRINGE
INTRAMUSCULAR | Status: DC | PRN
  Administered 2020-07-22: 60 mg via INTRAVENOUS

## 2020-07-22 MED ORDER — OXYCODONE HCL 5 MG/5ML PO SOLN: 5.0000 mg | Freq: Once | ORAL | Status: AC | PRN

## 2020-07-22 MED ORDER — DEXAMETHASONE SODIUM PHOSPHATE 10 MG/ML IJ SOLN
INTRAMUSCULAR | Status: DC | PRN
  Administered 2020-07-22: 5 mg via INTRAVENOUS

## 2020-07-22 MED ORDER — ONDANSETRON HCL 4 MG/2ML IJ SOLN
INTRAMUSCULAR | Status: DC | PRN
  Administered 2020-07-22: 4 mg via INTRAVENOUS

## 2020-07-22 MED ORDER — CHLORHEXIDINE GLUCONATE CLOTH 2 % EX PADS: 6.0000 | MEDICATED_PAD | Freq: Once | CUTANEOUS | Status: DC

## 2020-07-22 MED ORDER — MIDAZOLAM HCL 2 MG/2ML IJ SOLN
INTRAMUSCULAR | Status: AC
  Filled 2020-07-22: qty 2

## 2020-07-22 MED ORDER — ROCURONIUM BROMIDE 10 MG/ML (PF) SYRINGE
PREFILLED_SYRINGE | INTRAVENOUS | Status: DC | PRN
  Administered 2020-07-22: 60 mg via INTRAVENOUS

## 2020-07-22 MED ORDER — ORAL CARE MOUTH RINSE: 15.0000 mL | Freq: Once | OROMUCOSAL | Status: AC

## 2020-07-22 MED ORDER — PROPOFOL 10 MG/ML IV BOLUS
INTRAVENOUS | Status: AC
  Filled 2020-07-22: qty 20

## 2020-07-22 MED ORDER — ONDANSETRON HCL 4 MG/2ML IJ SOLN: 4.0000 mg | Freq: Four times a day (QID) | INTRAMUSCULAR | Status: DC | PRN

## 2020-07-22 MED ORDER — FENTANYL CITRATE (PF) 250 MCG/5ML IJ SOLN
INTRAMUSCULAR | Status: AC
  Filled 2020-07-22: qty 5

## 2020-07-22 MED ORDER — BUPIVACAINE HCL (PF) 0.25 % IJ SOLN
INTRAMUSCULAR | Status: DC | PRN
  Administered 2020-07-22: 15 mL

## 2020-07-22 MED ORDER — FENTANYL CITRATE (PF) 100 MCG/2ML IJ SOLN
INTRAMUSCULAR | Status: DC | PRN
Start: 2020-07-22 — End: 2020-07-22
  Administered 2020-07-22: 50 ug via INTRAVENOUS

## 2020-07-22 MED ORDER — OXYCODONE HCL 5 MG PO TABS: 5.0000 mg | ORAL_TABLET | Freq: Four times a day (QID) | ORAL | 0 refills | Status: DC | PRN

## 2020-07-22 MED ORDER — BUPIVACAINE HCL (PF) 0.25 % IJ SOLN
INTRAMUSCULAR | Status: AC
  Filled 2020-07-22: qty 30

## 2020-07-22 MED ORDER — CHLORHEXIDINE GLUCONATE 0.12 % MT SOLN
15.0000 mL | Freq: Once | OROMUCOSAL | Status: AC
  Administered 2020-07-22: 15 mL via OROMUCOSAL
  Filled 2020-07-22: qty 15

## 2020-07-22 MED ORDER — LIDOCAINE 2% (20 MG/ML) 5 ML SYRINGE
INTRAMUSCULAR | Status: AC
  Filled 2020-07-22: qty 5

## 2020-07-22 MED ORDER — LACTATED RINGERS IV SOLN: INTRAVENOUS | Status: DC

## 2020-07-22 MED ORDER — SUGAMMADEX SODIUM 200 MG/2ML IV SOLN
INTRAVENOUS | Status: DC | PRN
  Administered 2020-07-22: 200 mg via INTRAVENOUS

## 2020-07-22 MED ORDER — ONDANSETRON HCL 4 MG/2ML IJ SOLN
INTRAMUSCULAR | Status: AC
  Filled 2020-07-22: qty 2

## 2020-07-22 MED ORDER — 0.9 % SODIUM CHLORIDE (POUR BTL) OPTIME
TOPICAL | Status: DC | PRN
  Administered 2020-07-22: 1000 mL

## 2020-07-22 MED ORDER — SODIUM CHLORIDE 0.9 % IR SOLN
Status: DC | PRN
  Administered 2020-07-22: 1000 mL

## 2020-07-22 MED ORDER — FENTANYL CITRATE (PF) 100 MCG/2ML IJ SOLN: 25.0000 ug | INTRAMUSCULAR | Status: DC | PRN

## 2020-07-22 MED ORDER — OXYCODONE HCL 5 MG PO TABS
5.0000 mg | ORAL_TABLET | Freq: Once | ORAL | Status: AC | PRN
  Administered 2020-07-22: 5 mg via ORAL

## 2020-07-22 MED ORDER — ROCURONIUM BROMIDE 10 MG/ML (PF) SYRINGE
PREFILLED_SYRINGE | INTRAVENOUS | Status: AC
  Filled 2020-07-22: qty 10

## 2020-07-22 MED ORDER — GABAPENTIN 300 MG PO CAPS
300.0000 mg | ORAL_CAPSULE | ORAL | Status: AC
  Administered 2020-07-22: 300 mg via ORAL
  Filled 2020-07-22: qty 1

## 2020-07-22 MED ORDER — ACETAMINOPHEN 500 MG PO TABS
1000.0000 mg | ORAL_TABLET | ORAL | Status: AC
  Administered 2020-07-22: 1000 mg via ORAL
  Filled 2020-07-22: qty 2

## 2020-07-22 MED ORDER — DEXAMETHASONE SODIUM PHOSPHATE 10 MG/ML IJ SOLN
INTRAMUSCULAR | Status: AC
  Filled 2020-07-22: qty 1

## 2020-07-22 MED ORDER — MIDAZOLAM HCL 2 MG/2ML IJ SOLN
INTRAMUSCULAR | Status: DC | PRN
  Administered 2020-07-22: 2 mg via INTRAVENOUS

## 2020-07-22 MED ORDER — ENSURE PRE-SURGERY PO LIQD: 296.0000 mL | Freq: Once | ORAL | Status: DC

## 2020-07-22 SURGICAL SUPPLY — 44 items
APPLIER CLIP 5 13 M/L LIGAMAX5 (MISCELLANEOUS) ×2
BLADE CLIPPER SURG (BLADE) IMPLANT
CANISTER SUCT 3000ML PPV (MISCELLANEOUS) ×2 IMPLANT
CHLORAPREP W/TINT 26 (MISCELLANEOUS) ×2 IMPLANT
CLIP APPLIE 5 13 M/L LIGAMAX5 (MISCELLANEOUS) ×1 IMPLANT
COVER MAYO STAND STRL (DRAPES) ×2 IMPLANT
COVER SURGICAL LIGHT HANDLE (MISCELLANEOUS) ×2 IMPLANT
COVER WAND RF STERILE (DRAPES) IMPLANT
DERMABOND ADVANCED (GAUZE/BANDAGES/DRESSINGS) ×1
DERMABOND ADVANCED .7 DNX12 (GAUZE/BANDAGES/DRESSINGS) ×1 IMPLANT
DRAPE C-ARM 42X120 X-RAY (DRAPES) IMPLANT
ELECT REM PT RETURN 9FT ADLT (ELECTROSURGICAL) ×2
ELECTRODE REM PT RTRN 9FT ADLT (ELECTROSURGICAL) ×1 IMPLANT
FILTER SMOKE EVAC LAPAROSHD (FILTER) IMPLANT
GLOVE BIO SURGEON STRL SZ8 (GLOVE) ×2 IMPLANT
GLOVE BIOGEL PI IND STRL 8 (GLOVE) ×1 IMPLANT
GLOVE BIOGEL PI INDICATOR 8 (GLOVE) ×1
GOWN STRL REUS W/ TWL LRG LVL3 (GOWN DISPOSABLE) ×2 IMPLANT
GOWN STRL REUS W/ TWL XL LVL3 (GOWN DISPOSABLE) ×1 IMPLANT
GOWN STRL REUS W/TWL LRG LVL3 (GOWN DISPOSABLE) ×2
GOWN STRL REUS W/TWL XL LVL3 (GOWN DISPOSABLE) ×1
KIT BASIN OR (CUSTOM PROCEDURE TRAY) ×2 IMPLANT
KIT TURNOVER KIT B (KITS) ×2 IMPLANT
L-HOOK LAP DISP 36CM (ELECTROSURGICAL) ×2
LHOOK LAP DISP 36CM (ELECTROSURGICAL) ×1 IMPLANT
NEEDLE 22X1 1/2 (OR ONLY) (NEEDLE) ×2 IMPLANT
NS IRRIG 1000ML POUR BTL (IV SOLUTION) ×2 IMPLANT
PAD ARMBOARD 7.5X6 YLW CONV (MISCELLANEOUS) ×2 IMPLANT
PENCIL BUTTON HOLSTER BLD 10FT (ELECTRODE) ×2 IMPLANT
POUCH RETRIEVAL ECOSAC 10 (ENDOMECHANICALS) ×1 IMPLANT
POUCH RETRIEVAL ECOSAC 10MM (ENDOMECHANICALS) ×1
SCISSORS LAP 5X35 DISP (ENDOMECHANICALS) ×2 IMPLANT
SET CHOLANGIOGRAPH 5 50 .035 (SET/KITS/TRAYS/PACK) ×2 IMPLANT
SET IRRIG TUBING LAPAROSCOPIC (IRRIGATION / IRRIGATOR) ×2 IMPLANT
SET TUBE SMOKE EVAC HIGH FLOW (TUBING) ×2 IMPLANT
SLEEVE ENDOPATH XCEL 5M (ENDOMECHANICALS) ×4 IMPLANT
SPECIMEN JAR SMALL (MISCELLANEOUS) ×2 IMPLANT
SUT VIC AB 4-0 PS2 27 (SUTURE) ×2 IMPLANT
TOWEL GREEN STERILE (TOWEL DISPOSABLE) ×2 IMPLANT
TOWEL GREEN STERILE FF (TOWEL DISPOSABLE) ×2 IMPLANT
TRAY LAPAROSCOPIC MC (CUSTOM PROCEDURE TRAY) ×2 IMPLANT
TROCAR XCEL BLUNT TIP 100MML (ENDOMECHANICALS) ×2 IMPLANT
TROCAR XCEL NON-BLD 5MMX100MML (ENDOMECHANICALS) ×2 IMPLANT
WATER STERILE IRR 1000ML POUR (IV SOLUTION) ×2 IMPLANT

## 2020-07-22 NOTE — Anesthesia Preprocedure Evaluation (Signed)
Anesthesia Evaluation  Patient identified by MRN, date of birth, ID band Patient awake    Reviewed: Allergy & Precautions, H&P , NPO status , Patient's Chart, lab work & pertinent test results  Airway Mallampati: II   Neck ROM: full    Dental   Pulmonary asthma ,    breath sounds clear to auscultation       Cardiovascular negative cardio ROS   Rhythm:regular Rate:Normal     Neuro/Psych  Headaches, PSYCHIATRIC DISORDERS Depression    GI/Hepatic   Endo/Other    Renal/GU      Musculoskeletal   Abdominal   Peds  Hematology  (+) JEHOVAH'S WITNESS  Anesthesia Other Findings   Reproductive/Obstetrics                             Anesthesia Physical Anesthesia Plan  ASA: II  Anesthesia Plan: General   Post-op Pain Management:    Induction: Intravenous  PONV Risk Score and Plan: 3 and Ondansetron, Dexamethasone, Midazolam and Treatment may vary due to age or medical condition  Airway Management Planned: Oral ETT  Additional Equipment:   Intra-op Plan:   Post-operative Plan: Extubation in OR  Informed Consent: I have reviewed the patients History and Physical, chart, labs and discussed the procedure including the risks, benefits and alternatives for the proposed anesthesia with the patient or authorized representative who has indicated his/her understanding and acceptance.       Plan Discussed with: CRNA, Anesthesiologist and Surgeon  Anesthesia Plan Comments:         Anesthesia Quick Evaluation

## 2020-07-22 NOTE — Anesthesia Postprocedure Evaluation (Signed)
Anesthesia Post Note  Patient: Stephanie Hancock  Procedure(s) Performed: LAPAROSCOPIC CHOLECYSTECTOMY (N/A Abdomen)     Patient location during evaluation: PACU Anesthesia Type: General Level of consciousness: awake and alert Pain management: pain level controlled Vital Signs Assessment: post-procedure vital signs reviewed and stable Respiratory status: spontaneous breathing, nonlabored ventilation, respiratory function stable and patient connected to nasal cannula oxygen Cardiovascular status: blood pressure returned to baseline and stable Postop Assessment: no apparent nausea or vomiting Anesthetic complications: no   No complications documented.  Last Vitals:  Vitals:   07/22/20 0930 07/22/20 0940  BP: 131/80 128/77  Pulse: 74 73  Resp: 20 18  Temp:  (!) 36.2 C  SpO2: 98% 96%    Last Pain:  Vitals:   07/22/20 0940  TempSrc:   PainSc: [place]

## 2020-07-22 NOTE — Anesthesia Procedure Notes (Signed)
Procedure Name: Intubation Date/Time: 07/22/2020 7:38 AM Performed by: Barrington Ellison, CRNA Pre-anesthesia Checklist: Patient identified, Emergency Drugs available, Suction available and Patient being monitored Patient Re-evaluated:Patient Re-evaluated prior to induction Oxygen Delivery Method: Circle System Utilized Preoxygenation: Pre-oxygenation with 100% oxygen Induction Type: IV induction Ventilation: Mask ventilation without difficulty Laryngoscope Size: Mac and 3 Grade View: Grade II Tube type: Oral Tube size: 7.0 mm Number of attempts: 1 Airway Equipment and Method: Stylet and Oral airway Placement Confirmation: ETT inserted through vocal cords under direct vision,  positive ETCO2 and breath sounds checked- equal and bilateral Secured at: 21 cm Tube secured with: Tape Dental Injury: Teeth and Oropharynx as per pre-operative assessment

## 2020-07-22 NOTE — Transfer of Care (Signed)
Immediate Anesthesia Transfer of Care Note  Patient: Stephanie Hancock  Procedure(s) Performed: LAPAROSCOPIC CHOLECYSTECTOMY (N/A Abdomen)  Patient Location: PACU  Anesthesia Type:General  Level of Consciousness: lethargic and responds to stimulation  Airway & Oxygen Therapy: Patient Spontanous Breathing and Patient connected to face mask oxygen  Post-op Assessment: Report given to RN  Post vital signs: Reviewed and stable  Last Vitals:  Vitals Value Taken Time  BP 142/76 07/22/20 0827  Temp    Pulse 70 07/22/20 0828  Resp 14 07/22/20 0828  SpO2 97 % 07/22/20 0828  Vitals shown include unvalidated device data.  Last Pain:  Vitals:   07/22/20 0659  TempSrc:   PainSc: 0-No pain      Patients Stated Pain Goal: 3 (16/10/96 0454)  Complications: No complications documented.

## 2020-07-22 NOTE — Op Note (Signed)
  07/22/2020  8:10 AM  PATIENT:  Stephanie Hancock  54 y.o. female  PRE-OPERATIVE DIAGNOSIS:  SYMPTOMATIC CHOLEITHIASIS, HISTORY OF CHOLEDOCHOLITHIASIS  POST-OPERATIVE DIAGNOSIS:  SYMPTOMATIC CHOLEITHIASIS, HISTORY OF   PROCEDURE:  Procedure(s): LAPAROSCOPIC CHOLECYSTECTOMY  SURGEON:  Georganna Skeans, MD  ASSISTANTS: Armandina Gemma, MD  ANESTHESIA:   local and general  EBL:  No intake/output data recorded.  BLOOD ADMINISTERED:none  DRAINS: none   SPECIMEN:  Excision  DISPOSITION OF SPECIMEN:  PATHOLOGY  COUNTS:  YES  DICTATION: .Dragon Dictation Procedure in detail: Informed consent was obtained.  She received intravenous antibiotics.  She was brought to the operating room and general endotracheal anesthesia was administered by the anesthesia staff.  Her abdomen was prepped and draped in a sterile fashion.  We did a timeout procedure.The infraumbilical region was infiltrated with local. Infraumbilical incision was made. Subcutaneous tissues were dissected down revealing the anterior fascia. This was divided sharply along the midline. Peritoneal cavity was entered under direct vision without complication. A 0 Vicryl pursestring was placed around the fascial opening. Hassan trocar was inserted into the abdomen. The abdomen was insufflated with carbon dioxide in standard fashion. Under direct vision a 5 mm epigastric and 5 mm right abdominal port x2 were placed.  Local was used at each site.  Laparoscopic exploration revealed evidence of chronic cholecystitis.  There were omental adhesions to the dome and body of the gallbladder.  These were swept down.  There were some more filmy adhesions from the duodenum to the infundibular area.  These were carefully cleared away.  Dissection began laterally and progressed medially identifying the cystic duct and the cystic artery.  We dissected until I had a critical view of safety.  Once we had good visualization, the anatomy was such that I did not feel  a cholangiogram was necessary.  Liver function tests were normal.  She also has a latex allergy and that limits our selection of cholangiogram catheters.  3 clips were placed proximally on the cystic duct, one was placed distally and it was divided.  The cystic artery was clipped twice proximally and once distally and divided.  The gallbladder was taken off the liver bed using cautery.  It was placed in a bag and removed from the abdomen.  The liver bed was cauterized to get excellent hemostasis.  The area was copiously irrigated.  Irrigation returned clear.  Liver bed was dry.  Clips remain in good position.  Ports were removed under direct vision.  Pneumoperitoneum was released.  Infraumbilical fascia was closed by tying the pursestring.  All 4 wounds were irrigated and the skin of each was closed with a running 4-0 Vicryl followed by Dermabond.  All counts were correct.  She tolerated procedure well without apparent complication and was taken recovery in stable condition.  PATIENT DISPOSITION:  PACU - hemodynamically stable.   Delay start of Pharmacological VTE agent (>24hrs) due to surgical blood loss or risk of bleeding:  no  Georganna Skeans, MD, MPH, FACS Pager: [phone]  10/15/20218:10 AM

## 2020-07-22 NOTE — H&P (Signed)
Stephanie Hancock is an 54 y.o. female.   Chief Complaint: symptomatic cholelithiasis HPI: Presents for laparoscopic cholecystectomy, IOC. She was previously scheduled but tested positive for COVID. She was asymptomatic. She has been feeling well.  Past Medical History:  Diagnosis Date  . Abnormal uterine bleeding   . Allergic rhinitis   . Anemia   . Asthma   . Depression    hx of  . Fibroid   . History of esophagogastroduodenoscopy (EGD) 2016   H pylori +  . Hypercholesterolemia   . Migraine    with aura  . Patient is Jehovah's Witness   . Vitamin D deficiency     Past Surgical History:  Procedure Laterality Date  . ROBOTIC ASSISTED TOTAL HYSTERECTOMY Bilateral 06/11/2017   Procedure: XI ROBOTIC ASSISTED TOTAL HYSTERECTOMY BILATERAL SALPINGECTOMY, RIGHT OOPHERECTOMY,  LYSIS OF ADHESIONS ,MINI LAPAROTOMY;  Surgeon: Everitt Amber, MD;  Location: [facility];  Service: Gynecology;  Laterality: Bilateral;  . TUBAL LIGATION     then reversal  . UTERINE FIBROID SURGERY      Family History  Problem Relation Age of Onset  . Hypertension Mother   . Diabetes Mother   . Osteoporosis Mother   . Hypertension Father   . Prostate cancer Father   . Heart attack Father   . Kidney cancer Sister    Social History:  reports that she has never smoked. She has never used smokeless tobacco. She reports current alcohol use of about 4.0 standard drinks of alcohol per week. She reports that she does not use drugs.  Allergies:  Allergies  Allergen Reactions  . Codeine Hives  . Kiwi Extract Swelling and Other (See Comments)    Tingling in throat/mouth swells.  . Levetiracetam Other (See Comments)    HALLUCINATIONS W/SUICIDAL IDEATION  . Other     NO BLOOD PRODUCTS   . Powder Itching and Other (See Comments)    LATEX GLOVES WITH POWDER CAUSES ITCHING    Medications Prior to Admission  Medication Sig Dispense Refill  . albuterol (PROVENTIL HFA;VENTOLIN HFA) 108 (90 BASE) MCG/ACT inhaler Inhale 1-2  puffs into the lungs every 6 (six) hours as needed for wheezing or shortness of breath.    Marland Kitchen albuterol (PROVENTIL) (5 MG/ML) 0.5% nebulizer solution Take 2.5 mg by nebulization every 6 (six) hours as needed for wheezing or shortness of breath.    Marland Kitchen aspirin-acetaminophen-caffeine (EXCEDRIN MIGRAINE) 250-250-65 MG tablet Take 2 tablets by mouth every 6 (six) hours as needed for migraine.     . Biotin 10000 MCG TBDP Take 10,000 mcg by mouth at bedtime.    Marland Kitchen estradiol (ESTRACE) 1 MG tablet TAKE 1 TABLET(1 MG) BY MOUTH DAILY (Patient taking differently: Take 1 mg by mouth at bedtime. ) 90 tablet 0  . fluticasone-salmeterol (ADVAIR HFA) 45-21 MCG/ACT inhaler Inhale 2 puffs into the lungs 2 (two) times daily.    . Galcanezumab-gnlm (EMGALITY) 120 MG/ML SOAJ Inject 120 mg into the skin every 30 (thirty) days. 1 pen 11  . mometasone-formoterol (DULERA) 200-5 MCG/ACT AERO Inhale 2 puffs into the lungs 2 (two) times daily.    . ondansetron (ZOFRAN ODT) 4 MG disintegrating tablet 4mg  ODT q4 hours prn nausea/vomit (Patient taking differently: Take 4 mg by mouth every 4 (four) hours as needed for nausea or vomiting. ) 10 tablet 0  . oxyCODONE (OXY IR/ROXICODONE) 5 MG immediate release tablet Take 1 tablet (5 mg total) by mouth every 6 (six) hours as needed for severe pain. 12 tablet 0  .  UBRELVY 100 MG TABS TAKE 1 TABLET BY MOUTH EVERY 2 HOURS AS NEEDED, MAXIMUM DAILY DOSE IS 2 TABLETS (Patient taking differently: Take 100 mg by mouth every 2 (two) hours as needed (migraine). ) 10 tablet 3    No results found for this or any previous visit (from the past 48 hour(s)). No results found.  Review of Systems  Blood pressure 139/80, pulse 72, temperature (!) 97.4 F (36.3 C), temperature source Oral, resp. rate 18, height 5\' 2"  (1.575 m), weight 60.6 kg, last menstrual period 03/29/2017, SpO2 100 %. Physical Exam HENT:     Head: Normocephalic.     Nose: Nose normal.  Eyes:     Pupils: Pupils are equal, round,  and reactive to light.  Cardiovascular:     Rate and Rhythm: Normal rate and regular rhythm.  Pulmonary:     Effort: Pulmonary effort is normal.     Breath sounds: Normal breath sounds.  Abdominal:     General: Abdomen is flat. There is no distension.     Palpations: There is no mass.     Tenderness: There is no abdominal tenderness.  Musculoskeletal:        General: No swelling.  Skin:    General: Skin is warm and dry.  Neurological:     Mental Status: She is alert and oriented to person, place, and time.  Psychiatric:        Mood and Affect: Mood normal.      Assessment/Plan Symptomatic cholelithiasis - for laparoscopic cholecystectomy, IOC. Procedure, risks, and benefits discussed and she agrees.  Zenovia Jarred, MD 07/22/2020, 6:56 AM

## 2020-07-22 NOTE — Interval H&P Note (Signed)
History and Physical Interval Note:  07/22/2020 6:59 AM  Judie Bonus  has presented today for surgery, with the diagnosis of SYMPTOMATIC CHOLEITHIASIS, HISTORY OF CHOLEDOCHOLITHIASIS.  The various methods of treatment have been discussed with the patient and family. After consideration of risks, benefits and other options for treatment, the patient has consented to  Procedure(s): LAPAROSCOPIC CHOLECYSTECTOMY WITH INTRAOPERATIVE CHOLANGIOGRAM (N/A) as a surgical intervention.  The patient's history has been reviewed, patient examined, no change in status, stable for surgery.  I have reviewed the patient's chart and labs.  Questions were answered to the patient's satisfaction.     Zenovia Jarred

## 2020-07-23 ENCOUNTER — Encounter (HOSPITAL_COMMUNITY): Payer: Self-pay | Admitting: General Surgery

## 2020-07-25 LAB — SURGICAL PATHOLOGY

## 2020-08-01 NOTE — Progress Notes (Signed)
53 y.o. [id number] Married [place] American female here for annual exam.    Positive Covid 19 Delta variant 05-30-20. Had Covid vaccines.  Last vaccine was Feb 10, 2020.  Patient having hot flashes, just recently.  She stopped her estrogen in preparation for her surgery, but has restarted it. (She wants to continue.) Had her gall bladder removed a week ago.   Will do her flu vaccine next week.  She will also do a Covid booster.   PCP:  Jonathon Jordan, MD   Patient's last menstrual period was 03/29/2017.           Sexually active: Yes.    The current method of family planning is status post hysterectomy.    Exercising: No.  not currently--due to recent gallbladder surgery. Smoker:  no  Health Maintenance: Pap: 02/2016 normal per patient, 09-13-15 Neg:Neg HR HPV History of abnormal Pap:  no MMG:  06/24/20 - BI-RADS1, cat C density - [facility] Colonoscopy:  06-22-15 normal;next 10 years BMD:   n/a  Result  n/a TDaP:  PCP Gardasil:   no HIV: Neg in pregnancy Hep C: 04-13-20 Neg Screening Labs:  PCP.  Has appointment next week.    reports that she has never smoked. She has never used smokeless tobacco. She reports current alcohol use of about 2.0 standard drinks of alcohol per week. She reports that she does not use drugs.  Past Medical History:  Diagnosis Date  . Abnormal uterine bleeding   . Allergic rhinitis   . Anemia   . Asthma   . Depression    hx of  . Fibroid   . History of COVID-19 05/30/2020   Pos for Delta variant  . History of esophagogastroduodenoscopy (EGD) 2016   H pylori +  . Hypercholesterolemia   . Migraine    with aura  . Patient is Jehovah's Witness   . Vitamin D deficiency     Past Surgical History:  Procedure Laterality Date  . CHOLECYSTECTOMY N/A 07/22/2020   Procedure: LAPAROSCOPIC CHOLECYSTECTOMY;  Surgeon: Georganna Skeans, MD;  Location: [place];  Service: General;  Laterality: N/A;  . ROBOTIC ASSISTED TOTAL HYSTERECTOMY Bilateral 06/11/2017    Procedure: XI ROBOTIC ASSISTED TOTAL HYSTERECTOMY BILATERAL SALPINGECTOMY, RIGHT OOPHERECTOMY,  LYSIS OF ADHESIONS ,MINI LAPAROTOMY;  Surgeon: Everitt Amber, MD;  Location: [facility];  Service: Gynecology;  Laterality: Bilateral;  . TUBAL LIGATION     then reversal  . UTERINE FIBROID SURGERY      Current Outpatient Medications  Medication Sig Dispense Refill  . albuterol (PROVENTIL HFA;VENTOLIN HFA) 108 (90 BASE) MCG/ACT inhaler Inhale 1-2 puffs into the lungs every 6 (six) hours as needed for wheezing or shortness of breath.    Marland Kitchen albuterol (PROVENTIL) (5 MG/ML) 0.5% nebulizer solution Take 2.5 mg by nebulization every 6 (six) hours as needed for wheezing or shortness of breath.    Marland Kitchen aspirin-acetaminophen-caffeine (EXCEDRIN MIGRAINE) 250-250-65 MG tablet Take 2 tablets by mouth every 6 (six) hours as needed for migraine.     . Biotin 10000 MCG TBDP Take 10,000 mcg by mouth at bedtime.    Marland Kitchen estradiol (ESTRACE) 1 MG tablet TAKE 1 TABLET(1 MG) BY MOUTH DAILY (Patient taking differently: Take 1 mg by mouth at bedtime. ) 90 tablet 0  . fluticasone-salmeterol (ADVAIR HFA) 45-21 MCG/ACT inhaler Inhale 2 puffs into the lungs 2 (two) times daily.    . Galcanezumab-gnlm (EMGALITY) 120 MG/ML SOAJ Inject 120 mg into the skin every 30 (thirty) days. 1 pen 11  . mometasone-formoterol (  DULERA) 200-5 MCG/ACT AERO Inhale 2 puffs into the lungs 2 (two) times daily.    Marland Kitchen UBRELVY 100 MG TABS TAKE 1 TABLET BY MOUTH EVERY 2 HOURS AS NEEDED, MAXIMUM DAILY DOSE IS 2 TABLETS (Patient taking differently: Take 100 mg by mouth every 2 (two) hours as needed (migraine). ) 10 tablet 3   No current facility-administered medications for this visit.    Family History  Problem Relation Age of Onset  . Hypertension Mother   . Diabetes Mother   . Osteoporosis Mother   . Hypertension Father   . Prostate cancer Father   . Heart attack Father   . Kidney cancer Sister     Review of Systems  All other systems reviewed and are  negative.   Exam:   BP 120/80   Pulse 74   Ht 5\' 1"  (1.549 m)   Wt 132 lb (59.9 kg)   LMP 03/29/2017   SpO2 98%   BMI 24.94 kg/m     General appearance: alert, cooperative and appears stated age Head: normocephalic, without obvious abnormality, atraumatic Neck: no adenopathy, supple, symmetrical, trachea midline and thyroid normal to inspection and palpation Lungs: clear to auscultation bilaterally Breasts: normal appearance, no masses or tenderness, No nipple retraction or dimpling, No nipple discharge or bleeding, No axillary adenopathy Heart: regular rate and rhythm Abdomen: soft, non-tender; no masses, no organomegaly Extremities: extremities normal, atraumatic, no cyanosis or edema Skin: skin color, texture, turgor normal. No rashes or lesions Lymph nodes: cervical, supraclavicular, and axillary nodes normal. Neurologic: grossly normal  Pelvic: External genitalia:  no lesions              No abnormal inguinal nodes palpated.              Urethra:  normal appearing urethra with no masses, tenderness or lesions              Bartholins and Skenes: normal                 Vagina: normal appearing vagina with normal color and discharge, no lesions              Cervix:  absent              Pap taken: No. Bimanual Exam:  Uterus:  absent              Adnexa: no mass, fullness, tenderness              Rectal exam: Yes.  .  Confirms.              Anus:  normal sphincter tone, no lesions  Chaperone was present for exam.  Assessment:   Well woman visit with normal exam. Status post robotic hysterectomy, bilateral salpingectomy, right oophorectomy 06/11/17.  Menopausal symptoms controlled on ERT. Migraine with aura.  Asthma.  Plan: Mammogram screening discussed. Self breast awareness reviewed. Pap and HR HPV as above. Guidelines for Calcium, Vitamin D, regular exercise program including cardiovascular and weight bearing exercise. Rx for Estrace for one year.  Discused WHI and  use of HRT which can increase risk of PE, DVT, and stroke.   Labs with PCP.  Follow up annually and prn.    After visit summary provided.

## 2020-08-03 ENCOUNTER — Other Ambulatory Visit: Payer: Self-pay

## 2020-08-03 ENCOUNTER — Ambulatory Visit (INDEPENDENT_AMBULATORY_CARE_PROVIDER_SITE_OTHER): Payer: BC Managed Care – PPO | Admitting: Obstetrics and Gynecology

## 2020-08-03 ENCOUNTER — Encounter: Payer: Self-pay | Admitting: Obstetrics and Gynecology

## 2020-08-03 VITALS — BP 120/80 | HR 74 | Ht 61.0 in | Wt 132.0 lb

## 2020-08-03 DIAGNOSIS — Z01419 Encounter for gynecological examination (general) (routine) without abnormal findings: Secondary | ICD-10-CM

## 2020-08-03 MED ORDER — ESTRADIOL 1 MG PO TABS
1.0000 mg | ORAL_TABLET | Freq: Every day | ORAL | 3 refills | Status: DC
Start: 2020-08-03 — End: 2021-09-13

## 2020-08-03 NOTE — Patient Instructions (Signed)

## 2020-08-11 DIAGNOSIS — Z90710 Acquired absence of both cervix and uterus: Secondary | ICD-10-CM | POA: Diagnosis not present

## 2020-08-11 DIAGNOSIS — E559 Vitamin D deficiency, unspecified: Secondary | ICD-10-CM | POA: Diagnosis not present

## 2020-08-11 DIAGNOSIS — G43909 Migraine, unspecified, not intractable, without status migrainosus: Secondary | ICD-10-CM | POA: Diagnosis not present

## 2020-08-11 DIAGNOSIS — Z79899 Other long term (current) drug therapy: Secondary | ICD-10-CM | POA: Diagnosis not present

## 2020-08-11 DIAGNOSIS — K8020 Calculus of gallbladder without cholecystitis without obstruction: Secondary | ICD-10-CM | POA: Diagnosis not present

## 2020-08-11 DIAGNOSIS — J4530 Mild persistent asthma, uncomplicated: Secondary | ICD-10-CM | POA: Diagnosis not present

## 2020-08-11 DIAGNOSIS — Z23 Encounter for immunization: Secondary | ICD-10-CM | POA: Diagnosis not present

## 2020-08-11 DIAGNOSIS — E7800 Pure hypercholesterolemia, unspecified: Secondary | ICD-10-CM | POA: Diagnosis not present

## 2020-08-11 DIAGNOSIS — Z0000 Encounter for general adult medical examination without abnormal findings: Secondary | ICD-10-CM | POA: Diagnosis not present

## 2020-09-18 ENCOUNTER — Other Ambulatory Visit: Payer: Self-pay | Admitting: Obstetrics and Gynecology

## 2020-09-28 DIAGNOSIS — Z20822 Contact with and (suspected) exposure to covid-19: Secondary | ICD-10-CM | POA: Diagnosis not present

## 2020-09-30 DIAGNOSIS — Z20822 Contact with and (suspected) exposure to covid-19: Secondary | ICD-10-CM | POA: Diagnosis not present

## 2020-11-15 NOTE — Progress Notes (Addendum)
PATIENT: Belina Mandile DOB: 10-03-1966  REASON FOR VISIT: follow up HISTORY FROM: patient  Chief Complaint  Patient presents with  . Follow-up    Rm 1 alone Pt is well, have about 10 migraines a month      HISTORY OF PRESENT ILLNESS: 11/16/20 ALL:  She returns for migraine follow up. She continues [place].  She reports that she is doing really well.  She is tolerating medications without obvious adverse effects.  She continues to have about 10 migraine days per month.  She is using 5-8 doses of Ubrelvy each month.  Occasionally she will use Excedrin with Ubrelvy.  She feels that migraines are very well managed at this time.  She does note that she occasionally has black dots/bugs in her peripheral vision prior to headache.  Abortive medicines help with this.  Symptoms are not new and have improved significantly with migraine management.  Eye exam last over a year ago.  No other concerns of hallucinations or mood changes.   05/16/2020 ALL: Serah Nicoletti is a 55 y.o. female here today for follow up for migraines. She was started on Emgality in 12/2019. She has had 4 injections. Allergies (rash, whelps) with Amovig and Ajovy. She has not needed to Benadryl. She reports migraines are improved in frequency and intensity. She reports having about 8 migraines a month. Roselyn Meier helps with abortive therapy. She uses Excedrin as well and feels she uses it more often than she should. She continues discussion of weaning Estrace with PCP.   She is planning to have a cholecystectomy on 8/31. She has 12 tablets of oxycodone for post op. She does not take this medication. She is eating differently which she feels has helped. She has eliminated fried and spicy foods. She is eating more fruits. She is drinking more water.    HISTORY: (copied from Dr Cathren Laine note on 12/14/2019)  12/14/2019: We have not seen patient since 2018, here for migraines In the past she tried multiple medications (see  below). She states since she saw [place] she has tried aimovig with a bad rash. The can be on either side, behind the eye, can last 24-72 hours and moderately severe to severe. Pulsating, pounding, throbbing, light and sound sensitivity, smells cam trigger and bother, she has a UV light, light can trigger even natural sunlight. 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 She has had them since the age of 60. Worsening in the last year. She takes excedrin but understands about medication overuse. They cn be severe. Sleep helps, excedrin helps. No other focal neurologic deficits, associated symptoms, inciting events or modifiable factors. Ajovy had a large rash. She has a latex allergy cannot take aimovig.    Patient had hair loss with topiramate. Doesn't want to try Zonegran bc it has similar side effects. Medications tried: Sumatriptan po, zolmitriptan, nortriptyline, tizanidine, Keppra (hallucinations and suicidal ideation), excedrin migraine, advil. Amerge, Topamx, maxalt, steroids, Topiramate (allopecia), Zonegran (doesn't want to try bc may also have allopecia), Qudexy. By review of chart additional meds tried since being seen in 2018 include: Aimovig, excedrin, ibuprofen, imitrex injections, gabapentin, diclofenac tan relpax, reglan and ketorolac injections, zomig, tramadol, propranolol, zofran tablet.  Ajovy had a large rash. She has a latex allergy cannot take aimovig.    07/24/2017: showed No acute intracranial abnormalities including mass lesion or mass effect, hydrocephalus, extra-axial fluid collection, midline shift, hemorrhage, or acute infarction, large ischemic events (personally reviewed images).   07/2018 TSH normal  PRIOR Addendum Patient had hair loss with topiramate. Doesn't want to try Zonegran bc it has similar side effects. Medications tried: Sumatriptan, zolmitriptan, nortriptyline, tizanidine, Keppra (hallucinations and suicidal ideation), excedrin migraine, advil. Amerge, Topamx,  maxalt, steroids  Medications tried: Sumatriptan, zolmitriptan, nortriptyline, tizanidine, Keppra (hallucinations and suicidal ideation), excedrin migraine, advil. Amerge, Topamx, maxalt, steroids, Topiramate (allopecia), Zonegran (doesn't want to try bc may also have allopecia), Qudexy.   HPI:  Jesus Poplin is a 55 y.o. female here as a referral from Dr. Stephanie Acre for migraines.  Past medical history of asthma, migraines, anemia, uterine fibroids, rhinitis, H. pylori, vitamin D deficiency. Mother with migraines. Daughter with migraines. She has had migraines since the age of 77. She used to get an aura, "bouncy orbs or black and white lines", no auras now. She takes excedrin excessively, +overuse. No aura. Daily headaches, she has 14 migrainous a month for years and can last all day. If she didn't treat she would have daily migraines. They can be unilateral, pounding, throbbing, behind the eye, pain in the left temple, nausea, vomiting, light and sound sensitivity. Lights aggravate. Sitting still helps in a dark room. Weather changes with aggravate her migraines.   Medications tried: Sumatriptan, zolmitriptan, nortriptyline, tizanidine, Keppra (hallucinations and suicidal ideation), excedrin migraine, advil. Amerge, Topamx, maxalt, steroids   Reviewed notes, labs and imaging from outside physicians, which showed:  MRI brain 07/2017 showed No acute intracranial abnormalities including mass lesion or mass effect, hydrocephalus, extra-axial fluid collection, midline shift, hemorrhage, or acute infarction, large ischemic events (personally reviewed images)  CBC with anemia 11.2/33.5, BMP elevated glusoce. 06/2017.  Medications tried: Sumatriptan, zolmitriptan, nortriptyline, tizanidine.  Patient was being seen by the [facility] and he retired.   REVIEW OF SYSTEMS: Out of a complete 14 system review of symptoms, the patient complains only of the following symptoms, headaches, hot  flashes and all other reviewed systems are negative.  ALLERGIES: Allergies  Allergen Reactions  . Codeine Hives  . Kiwi Extract Swelling and Other (See Comments)    Tingling in throat/mouth swells.  . Levetiracetam Other (See Comments)    HALLUCINATIONS W/SUICIDAL IDEATION  . Other     NO BLOOD PRODUCTS   . Powder Itching and Other (See Comments)    LATEX GLOVES WITH POWDER CAUSES ITCHING    HOME MEDICATIONS: Outpatient Medications Prior to Visit  Medication Sig Dispense Refill  . albuterol (PROVENTIL HFA;VENTOLIN HFA) 108 (90 BASE) MCG/ACT inhaler Inhale 1-2 puffs into the lungs every 6 (six) hours as needed for wheezing or shortness of breath.    Marland Kitchen albuterol (PROVENTIL) (5 MG/ML) 0.5% nebulizer solution Take 2.5 mg by nebulization every 6 (six) hours as needed for wheezing or shortness of breath.    Marland Kitchen aspirin-acetaminophen-caffeine (EXCEDRIN MIGRAINE) 250-250-65 MG tablet Take 2 tablets by mouth every 6 (six) hours as needed for migraine.     . Biotin 10000 MCG TBDP Take 10,000 mcg by mouth at bedtime.    . Cholecalciferol (VITAMIN D3) 1.25 MG (50000 UT) CAPS 1 tablet    . estradiol (ESTRACE) 1 MG tablet Take 1 tablet (1 mg total) by mouth at bedtime. 90 tablet 3  . fluticasone-salmeterol (ADVAIR HFA) 45-21 MCG/ACT inhaler Inhale 2 puffs into the lungs 2 (two) times daily.    . mometasone-formoterol (DULERA) 200-5 MCG/ACT AERO Inhale 2 puffs into the lungs 2 (two) times daily.    . Galcanezumab-gnlm (EMGALITY) 120 MG/ML SOAJ Inject 120 mg into the skin every 30 (thirty) days. 1 pen 11  .  UBRELVY 100 MG TABS TAKE 1 TABLET BY MOUTH EVERY 2 HOURS AS NEEDED, MAXIMUM DAILY DOSE IS 2 TABLETS (Patient taking differently: Take 100 mg by mouth every 2 (two) hours as needed (migraine).) 10 tablet 3   No facility-administered medications prior to visit.    PAST MEDICAL HISTORY: Past Medical History:  Diagnosis Date  . Abnormal uterine bleeding   . Allergic rhinitis   . Anemia   .  Asthma   . Depression    hx of  . Fibroid   . History of COVID-19 05/30/2020   Pos for Delta variant  . History of esophagogastroduodenoscopy (EGD) 2016   H pylori +  . Hypercholesterolemia   . Migraine    with aura  . Patient is Jehovah's Witness   . Vitamin D deficiency     PAST SURGICAL HISTORY: Past Surgical History:  Procedure Laterality Date  . CHOLECYSTECTOMY N/A 07/22/2020   Procedure: LAPAROSCOPIC CHOLECYSTECTOMY;  Surgeon: Georganna Skeans, MD;  Location: [place];  Service: General;  Laterality: N/A;  . ROBOTIC ASSISTED TOTAL HYSTERECTOMY Bilateral 06/11/2017   Procedure: XI ROBOTIC ASSISTED TOTAL HYSTERECTOMY BILATERAL SALPINGECTOMY, RIGHT OOPHERECTOMY,  LYSIS OF ADHESIONS ,MINI LAPAROTOMY;  Surgeon: Everitt Amber, MD;  Location: [facility];  Service: Gynecology;  Laterality: Bilateral;  . TUBAL LIGATION     then reversal  . UTERINE FIBROID SURGERY      FAMILY HISTORY: Family History  Problem Relation Age of Onset  . Hypertension Mother   . Diabetes Mother   . Osteoporosis Mother   . Hypertension Father   . Prostate cancer Father   . Heart attack Father   . Kidney cancer Sister     SOCIAL HISTORY: Social History   Socioeconomic History  . Marital status: Married    Spouse name: Not on file  . Number of children: 2  . Years of education: Not on file  . Highest education level: Some college, no degree  Occupational History  . Not on file  Tobacco Use  . Smoking status: Never Smoker  . Smokeless tobacco: Never Used  Vaping Use  . Vaping Use: Never used  Substance and Sexual Activity  . Alcohol use: Yes    Alcohol/week: 2.0 standard drinks    Types: 2 Cans of beer per week  . Drug use: No  . Sexual activity: Yes    Birth control/protection: Surgical    Comment: hysterectomy  Other Topics Concern  . Not on file  Social History Narrative   Lives at home with husband   Caffeine: 1 cup coffee every other day   Right handed predominantly, uses both hands       Social Determinants of Health   Financial Resource Strain: Not on file  Food Insecurity: Not on file  Transportation Needs: Not on file  Physical Activity: Not on file  Stress: Not on file  Social Connections: Not on file  Intimate Partner Violence: Not on file      PHYSICAL EXAM  Vitals:   11/16/20 0922  BP: 119/78  Pulse: 74  Weight: 139 lb (63 kg)  Height: 5\' 1"  (1.549 m)   Body mass index is 26.26 kg/m.  Generalized: Well developed, in no acute distress  Cardiology: normal rate and rhythm, no murmur noted Respiratory: clear to auscultation bilaterally  Neurological examination  Mentation: Alert oriented to time, place, history taking. Follows all commands speech and language fluent Cranial nerve II-XII: Pupils were equal round reactive to light. Extraocular movements were full, visual field  were full  Motor: The motor testing reveals 5 over 5 strength of all 4 extremities. Good symmetric motor tone is noted throughout.  Gait and station: Gait is normal.    DIAGNOSTIC DATA (LABS, IMAGING, TESTING) - I reviewed patient records, labs, notes, testing and imaging myself where available.  No flowsheet data found.   Lab Results  Component Value Date   WBC 4.8 07/15/2020   HGB 12.6 07/15/2020   HCT 38.5 07/15/2020   MCV 91.9 07/15/2020   PLT 267 07/15/2020      Component Value Date/Time   NA 140 07/15/2020 0955   NA 140 04/04/2017 1636   K 3.9 07/15/2020 0955   CL 106 07/15/2020 0955   CO2 25 07/15/2020 0955   GLUCOSE 97 07/15/2020 0955   BUN 11 07/15/2020 0955   BUN 14 04/04/2017 1636   CREATININE 0.76 07/15/2020 0955   CALCIUM 9.5 07/15/2020 0955   PROT 7.2 06/02/2020 0929   ALBUMIN 4.0 06/02/2020 0929   AST 22 06/02/2020 0929   ALT 28 06/02/2020 0929   ALKPHOS 72 06/02/2020 0929   BILITOT 0.3 06/02/2020 0929   GFRNONAA >60 07/15/2020 0955   GFRAA >60 06/02/2020 0929   No results found for: CHOL, HDL, LDLCALC, LDLDIRECT, TRIG, CHOLHDL No results  found for: HGBA1C No results found for: VITAMINB12 Lab Results  Component Value Date   TSH 2.520 07/23/2018       ASSESSMENT AND PLAN 55 y.o. year old female  has a past medical history of Abnormal uterine bleeding, Allergic rhinitis, Anemia, Asthma, Depression, Fibroid, History of COVID-19 (05/30/2020), History of esophagogastroduodenoscopy (EGD) (2016), Hypercholesterolemia, Migraine, Patient is Jehovah's Witness, and Vitamin D deficiency. here with     ICD-10-CM   1. Migraine with aura and without status migrainosus, not intractable  [email or web address].[place] continues to feel that migraines are well managed on current regimen.  Vision changes seem to be correlated with migraine with aura.  Fortunately, symptoms have improved and are aborted with Ubrelvy.  MRI was normal in 2018.  We will continue Emgality every month and Ubrelvy as needed.  She may use Excedrin sparingly.  She will continue close follow-up with primary care.  Healthy lifestyle habits encouraged.  She will follow-up with [place] in 1 year, sooner if needed.  She verbalizes understanding and agreement with this plan.  No orders of the defined types were placed in this encounter.    Meds ordered this encounter  Medications  . Galcanezumab-gnlm (EMGALITY) 120 MG/ML SOAJ    Sig: Inject 120 mg into the skin every 30 (thirty) days.    Dispense:  3 mL    Refill:  3    Order Specific Question:   Supervising Provider    Answer:   Melvenia Beam [id number]  . Ubrogepant (UBRELVY) 100 MG TABS    Sig: TAKE 1 TABLET BY MOUTH EVERY 2 HOURS AS NEEDED, MAXIMUM DAILY DOSE IS 2 TABLETS    Dispense:  10 tablet    Refill:  11    Order Specific Question:   Supervising Provider    Answer:   Melvenia Beam [[id number]]      I spent 20 minutes with the patient. 50% of this time was spent counseling and educating patient on plan of care and medications.    Debbora Presto, FNP-C 11/16/2020, 10:32 AM [facility] [address],  [place], [place] [address] [phone]  Made any corrections needed, and agree with history,  physical, neuro exam,assessment and plan as stated.     Sarina Ill, MD [facility]

## 2020-11-16 ENCOUNTER — Ambulatory Visit (INDEPENDENT_AMBULATORY_CARE_PROVIDER_SITE_OTHER): Payer: BC Managed Care – PPO | Admitting: Family Medicine

## 2020-11-16 ENCOUNTER — Encounter: Payer: Self-pay | Admitting: Family Medicine

## 2020-11-16 VITALS — BP 119/78 | HR 74 | Ht 61.0 in | Wt 139.0 lb

## 2020-11-16 DIAGNOSIS — G43109 Migraine with aura, not intractable, without status migrainosus: Secondary | ICD-10-CM | POA: Diagnosis not present

## 2020-11-16 DIAGNOSIS — E559 Vitamin D deficiency, unspecified: Secondary | ICD-10-CM | POA: Diagnosis not present

## 2020-11-16 MED ORDER — EMGALITY 120 MG/ML ~~LOC~~ SOAJ: 120.0000 mg | SUBCUTANEOUS | 3 refills | Status: DC

## 2020-11-16 MED ORDER — UBRELVY 100 MG PO TABS: ORAL_TABLET | ORAL | 11 refills | Status: DC

## 2020-11-16 NOTE — Patient Instructions (Signed)
Below is our plan:  We will continue [place].   Please make sure you are staying well hydrated. I recommend 50-60 ounces daily. Well balanced diet and regular exercise encouraged. Consistent sleep schedule with 6-8 hours recommended.   Please continue follow up with care team as directed.   Follow up in 1 year, sooner if needed.   You may receive a survey regarding today's visit. I encourage you to leave honest feed back as I do use this information to improve patient care. Thank you for seeing me today!       Migraine Headache A migraine headache is a very strong throbbing pain on one side or both sides of your head. This type of headache can also cause other symptoms. It can last from 4 hours to 3 days. Talk with your doctor about what things may bring on (trigger) this condition. What are the causes? The exact cause of this condition is not known. This condition may be triggered or caused by:  Drinking alcohol.  Smoking.  Taking medicines, such as: ? Medicine used to treat chest pain (nitroglycerin). ? Birth control pills. ? Estrogen. ? Some blood pressure medicines.  Eating or drinking certain products.  Doing physical activity. Other things that may trigger a migraine headache include:  Having a menstrual period.  Pregnancy.  Hunger.  Stress.  Not getting enough sleep or getting too much sleep.  Weather changes.  Tiredness (fatigue). What increases the risk?  Being 64-13 years old.  Being female.  Having a family history of migraine headaches.  Being Caucasian.  Having depression or anxiety.  Being very overweight. What are the signs or symptoms?  A throbbing pain. This pain may: ? Happen in any area of the head, such as on one side or both sides. ? Make it hard to do daily activities. ? Get worse with physical activity. ? Get worse around bright lights or loud noises.  Other symptoms may include: ? Feeling sick to your stomach  (nauseous). ? Vomiting. ? Dizziness. ? Being sensitive to bright lights, loud noises, or smells.  Before you get a migraine headache, you may get warning signs (an aura). An aura may include: ? Seeing flashing lights or having blind spots. ? Seeing bright spots, halos, or zigzag lines. ? Having tunnel vision or blurred vision. ? Having numbness or a tingling feeling. ? Having trouble talking. ? Having weak muscles.  Some people have symptoms after a migraine headache (postdromal phase), such as: ? Tiredness. ? Trouble thinking (concentrating). How is this treated?  Taking medicines that: ? Relieve pain. ? Relieve the feeling of being sick to your stomach. ? Prevent migraine headaches.  Treatment may also include: ? Having acupuncture. ? Avoiding foods that bring on migraine headaches. ? Learning ways to control your body functions (biofeedback). ? Therapy to help you know and deal with negative thoughts (cognitive behavioral therapy). Follow these instructions at home: Medicines  Take over-the-counter and prescription medicines only as told by your doctor.  Ask your doctor if the medicine prescribed to you: ? Requires you to avoid driving or using heavy machinery. ? Can cause trouble pooping (constipation). You may need to take these steps to prevent or treat trouble pooping:  Drink enough fluid to keep your pee (urine) pale yellow.  Take over-the-counter or prescription medicines.  Eat foods that are high in fiber. These include beans, whole grains, and fresh fruits and vegetables.  Limit foods that are high in fat and sugar.  These include fried or sweet foods. Lifestyle  Do not drink alcohol.  Do not use any products that contain nicotine or tobacco, such as cigarettes, e-cigarettes, and chewing tobacco. If you need help quitting, ask your doctor.  Get at least 8 hours of sleep every night.  Limit and deal with stress. General instructions  Keep a journal to  find out what may bring on your migraine headaches. For example, write down: ? What you eat and drink. ? How much sleep you get. ? Any change in what you eat or drink. ? Any change in your medicines.  If you have a migraine headache: ? Avoid things that make your symptoms worse, such as bright lights. ? It may help to lie down in a dark, quiet room. ? Do not drive or use heavy machinery. ? Ask your doctor what activities are safe for you.  Keep all follow-up visits as told by your doctor. This is important.      Contact a doctor if:  You get a migraine headache that is different or worse than others you have had.  You have more than 15 headache days in one month. Get help right away if:  Your migraine headache gets very bad.  Your migraine headache lasts longer than 72 hours.  You have a fever.  You have a stiff neck.  You have trouble seeing.  Your muscles feel weak or like you cannot control them.  You start to lose your balance a lot.  You start to have trouble walking.  You pass out (faint).  You have a seizure. Summary  A migraine headache is a very strong throbbing pain on one side or both sides of your head. These headaches can also cause other symptoms.  This condition may be treated with medicines and changes to your lifestyle.  Keep a journal to find out what may bring on your migraine headaches.  Contact a doctor if you get a migraine headache that is different or worse than others you have had.  Contact your doctor if you have more than 15 headache days in a month. This information is not intended to replace advice given to you by your health care provider. Make sure you discuss any questions you have with your health care provider. Document Revised: 01/16/2019 Document Reviewed: 11/06/2018 Elsevier Patient Education  Equality.

## 2020-11-17 NOTE — Telephone Encounter (Signed)
Received window tinting permit application. Dx: chronic migraine and request made for 5 year permit, max allowed. Form signed by Dr Jaynee Eagles and sent to medical records for processing.

## 2020-12-22 ENCOUNTER — Other Ambulatory Visit: Payer: Self-pay | Admitting: Neurology

## 2021-03-15 ENCOUNTER — Telehealth: Payer: Self-pay | Admitting: *Deleted

## 2021-03-15 NOTE — Telephone Encounter (Signed)
Completed Roselyn Meier PA on Cover My Meds. Key: [id number]. Awaiting determination from [place].

## 2021-03-20 NOTE — Telephone Encounter (Signed)
Received determination from [place]. Stephanie Hancock approved effective from 03/15/2021 through 03/14/2022.

## 2021-04-25 ENCOUNTER — Telehealth: Payer: Self-pay

## 2021-04-25 NOTE — Telephone Encounter (Signed)
PA for emgality has been sent via cover my meds to [place].   (Key: [facility])  Your information has been submitted to [place]. [employer] [place] will review the request and notify you of the determination decision directly, typically within 72 hours of receiving all information.  You will also receive your request decision electronically. To check for an update later, open this request again from your dashboard.  If [employer] has not responded within the specified timeframe or if you have any questions about your PA submission, contact [place]  directly at [phone]

## 2021-04-27 NOTE — Telephone Encounter (Signed)
Approvedon July 20 Effective from 04/25/2021 through 04/24/2022.  (Key: [id number])  This request has received a Favorable outcome from [place].  Please keep in mind this is not a guarantee of payment. Eligibility and Benefit determinations will be made at the time of service.  Please note any additional information provided by [facility] at the bottom of the screen.

## 2021-04-27 NOTE — Telephone Encounter (Signed)
(  Key: [id number])  This request has received a Favorable outcome from [place].  Please keep in mind this is not a guarantee of payment. Eligibility and Benefit determinations will be made at the time of service.  Please note any additional information provided by [facility] at the bottom of the screen.

## 2021-06-30 DIAGNOSIS — Z1231 Encounter for screening mammogram for malignant neoplasm of breast: Secondary | ICD-10-CM | POA: Diagnosis not present

## 2021-07-03 DIAGNOSIS — J4530 Mild persistent asthma, uncomplicated: Secondary | ICD-10-CM | POA: Diagnosis not present

## 2021-07-03 DIAGNOSIS — U071 COVID-19: Secondary | ICD-10-CM | POA: Diagnosis not present

## 2021-07-13 ENCOUNTER — Encounter: Payer: Self-pay | Admitting: Obstetrics and Gynecology

## 2021-07-19 IMAGING — CR DG FINGER RING 2+V*R*
3 series · 3 of 3 positions shown · non-contrast
Comparison: None.

CLINICAL DATA: Pain following injury

EXAM:
RIGHT FOURTH FINGER 2+V

[x finger pa right]
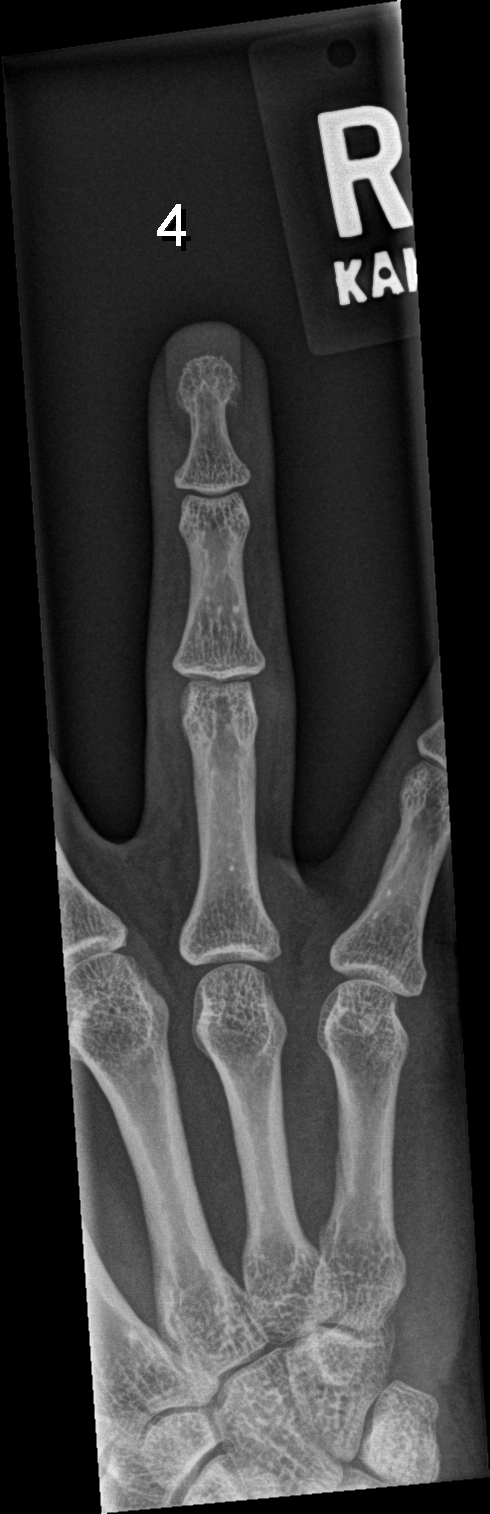

[x finger obl right]
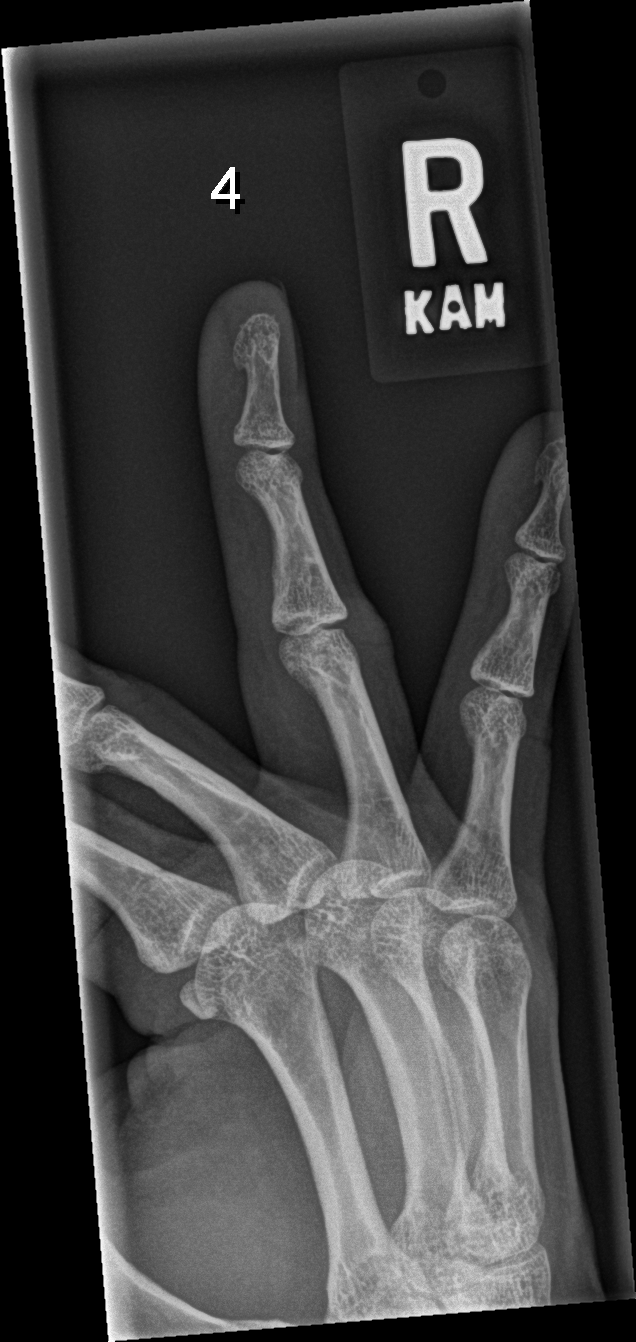

[x finger lat right]
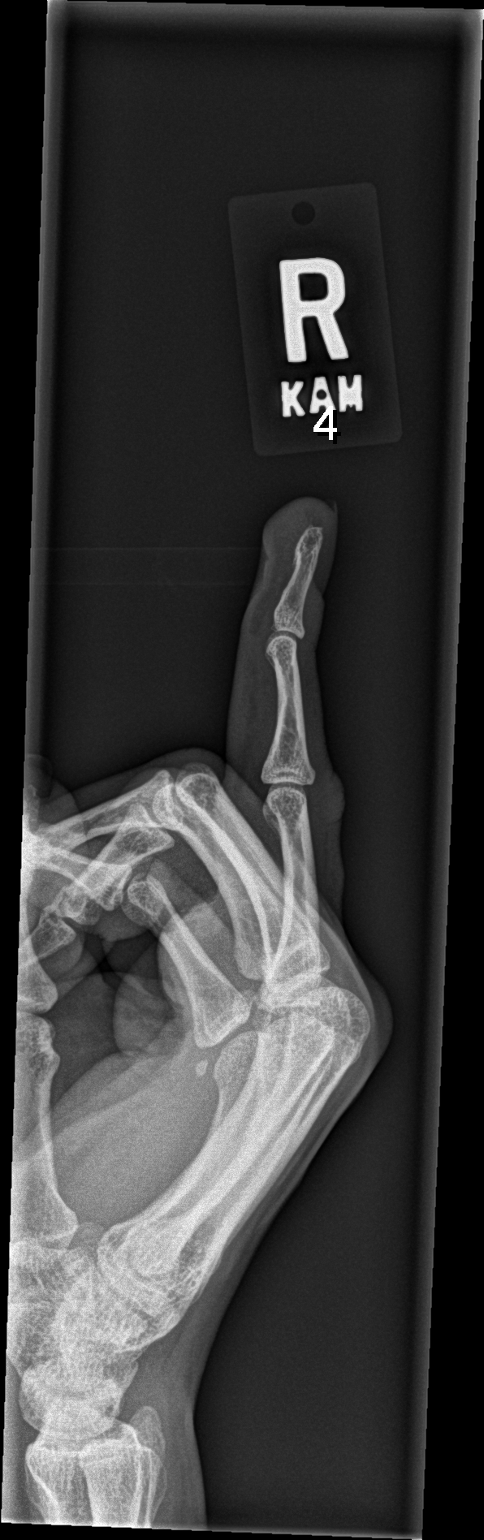

[3 of 3 positions shown; findings below may reference images not displayed]

FINDINGS: Frontal, oblique, and lateral views were obtained. No fracture or
dislocation. Joint spaces appear normal. No erosive change.
IMPRESSION: No fracture or dislocation.  No evident arthropathy.

## 2021-09-06 DIAGNOSIS — E559 Vitamin D deficiency, unspecified: Secondary | ICD-10-CM | POA: Diagnosis not present

## 2021-09-06 DIAGNOSIS — Z90710 Acquired absence of both cervix and uterus: Secondary | ICD-10-CM | POA: Diagnosis not present

## 2021-09-06 DIAGNOSIS — Z79899 Other long term (current) drug therapy: Secondary | ICD-10-CM | POA: Diagnosis not present

## 2021-09-06 DIAGNOSIS — K8020 Calculus of gallbladder without cholecystitis without obstruction: Secondary | ICD-10-CM | POA: Diagnosis not present

## 2021-09-06 DIAGNOSIS — Z0000 Encounter for general adult medical examination without abnormal findings: Secondary | ICD-10-CM | POA: Diagnosis not present

## 2021-09-06 DIAGNOSIS — J4530 Mild persistent asthma, uncomplicated: Secondary | ICD-10-CM | POA: Diagnosis not present

## 2021-09-06 DIAGNOSIS — Z1159 Encounter for screening for other viral diseases: Secondary | ICD-10-CM | POA: Diagnosis not present

## 2021-09-06 DIAGNOSIS — G43909 Migraine, unspecified, not intractable, without status migrainosus: Secondary | ICD-10-CM | POA: Diagnosis not present

## 2021-09-06 DIAGNOSIS — F330 Major depressive disorder, recurrent, mild: Secondary | ICD-10-CM | POA: Diagnosis not present

## 2021-09-06 DIAGNOSIS — E7800 Pure hypercholesterolemia, unspecified: Secondary | ICD-10-CM | POA: Diagnosis not present

## 2021-09-06 DIAGNOSIS — R002 Palpitations: Secondary | ICD-10-CM | POA: Diagnosis not present

## 2021-09-06 NOTE — Progress Notes (Signed)
55 y.o. [id number] Married [place] American female here for annual exam.  Taking estrogen. Some hot flashes. She wants to continue this.  Sleeping well if she takes Benadryl.  Had Covid three times.   Has low back pain for years.   PCP:  Jonathon Jordan, MD   Patient's last menstrual period was 03/29/2017.           Sexually active: yes  The current method of family planning is status post hysterectomy.    Exercising: No.   exercise Smoker:  no  Health Maintenance: Pap:  02/2016 normal per patient, 09-13-15 Neg:Neg HR HPV History of abnormal Pap:  no MMG:  06-30-21 3D & rt breast imaging done category c density birads 1:neg Colonoscopy:  06-22-15 normal;10 years BMD:   none  Result  n/a TDaP:  PCP Gardasil:   no HIV: Neg in preg Hep C: 04-13-20 Neg Screening Labs: PCP. Flu vaccine: completed.  Covid booster later this month.    reports that she has never smoked. She has never used smokeless tobacco. She reports current alcohol use of about 3.0 standard drinks per week. She reports that she does not use drugs.  Past Medical History:  Diagnosis Date   Abnormal uterine bleeding    Allergic rhinitis    Anemia    Asthma    Depression    hx of   Fibroid    History of COVID-19 05/30/2020   Pos for Delta variant   History of esophagogastroduodenoscopy (EGD) 2016   H pylori +   Hypercholesterolemia    Migraine    with aura   Patient is Jehovah's Witness    Vitamin D deficiency     Past Surgical History:  Procedure Laterality Date   CHOLECYSTECTOMY N/A 07/22/2020   Procedure: LAPAROSCOPIC CHOLECYSTECTOMY;  Surgeon: Georganna Skeans, MD;  Location: [facility];  Service: General;  Laterality: N/A;   ROBOTIC ASSISTED TOTAL HYSTERECTOMY Bilateral 06/11/2017   Procedure: XI ROBOTIC ASSISTED TOTAL HYSTERECTOMY BILATERAL SALPINGECTOMY, RIGHT OOPHERECTOMY,  LYSIS OF ADHESIONS ,MINI LAPAROTOMY;  Surgeon: Everitt Amber, MD;  Location: [facility];  Service: Gynecology;  Laterality: Bilateral;    TUBAL LIGATION     then reversal   UTERINE FIBROID SURGERY      Current Outpatient Medications  Medication Sig Dispense Refill   albuterol (PROVENTIL HFA;VENTOLIN HFA) 108 (90 BASE) MCG/ACT inhaler Inhale 1-2 puffs into the lungs every 6 (six) hours as needed for wheezing or shortness of breath.     albuterol (PROVENTIL) (5 MG/ML) 0.5% nebulizer solution Take 2.5 mg by nebulization every 6 (six) hours as needed for wheezing or shortness of breath.     aspirin-acetaminophen-caffeine (EXCEDRIN MIGRAINE) 250-250-65 MG tablet Take 2 tablets by mouth every 6 (six) hours as needed for migraine.      Biotin 10000 MCG TBDP Take 10,000 mcg by mouth at bedtime.     EMGALITY 120 MG/ML SOAJ ADMINISTER 1 ML(120MG ) UNDER THE SKIN EVERY 30 DAYS 1 mL 0   estradiol (ESTRACE) 1 MG tablet Take 1 tablet (1 mg total) by mouth at bedtime. 90 tablet 3   fluticasone-salmeterol (ADVAIR HFA) 45-21 MCG/ACT inhaler Inhale 2 puffs into the lungs 2 (two) times daily.     mometasone-formoterol (DULERA) 200-5 MCG/ACT AERO Inhale 2 puffs into the lungs 2 (two) times daily.     Ubrogepant (UBRELVY) 100 MG TABS TAKE 1 TABLET BY MOUTH EVERY 2 HOURS AS NEEDED, MAXIMUM DAILY DOSE IS 2 TABLETS 10 tablet 11   No current facility-administered medications for  this visit.    Family History  Problem Relation Age of Onset   Hypertension Mother    Diabetes Mother    Osteoporosis Mother    Hypertension Father    Prostate cancer Father    Heart attack Father    Kidney cancer Sister     Review of Systems  Constitutional: Negative.   HENT: Negative.    Eyes: Negative.   Respiratory: Negative.    Cardiovascular: Negative.   Gastrointestinal: Negative.   Endocrine: Negative.   Genitourinary: Negative.   Musculoskeletal: Negative.   Skin: Negative.   Allergic/Immunologic: Negative.   Neurological: Negative.   Hematological: Negative.   Psychiatric/Behavioral: Negative.     Exam:   BP 120/76   Pulse 76   Resp 16   Ht  5' 0.75" (1.543 m)   Wt 138 lb (62.6 kg)   LMP 03/29/2017   BMI 26.29 kg/m     General appearance: alert, cooperative and appears stated age Head: normocephalic, without obvious abnormality, atraumatic Neck: no adenopathy, supple, symmetrical, trachea midline and thyroid normal to inspection and palpation Lungs: clear to auscultation bilaterally Breasts: normal appearance, no masses or tenderness, No nipple retraction or dimpling, No nipple discharge or bleeding, No axillary adenopathy Heart: regular rate and rhythm Abdomen: soft, non-tender; no masses, no organomegaly Extremities: extremities normal, atraumatic, no cyanosis or edema Skin: skin color, texture, turgor normal. No rashes or lesions Lymph nodes: cervical, supraclavicular, and axillary nodes normal. Neurologic: grossly normal  Pelvic: External genitalia:  no lesions              No abnormal inguinal nodes palpated.              Urethra:  normal appearing urethra with no masses, tenderness or lesions              Bartholins and Skenes: normal                 Vagina: normal appearing vagina with normal color and discharge, no lesions              Cervix:absent              Pap taken: no Bimanual Exam:  Uterus:  absent.              Adnexa: no mass, fullness, tenderness on right.  Left adnexal mass 4 cm, slightly tender.              Rectal exam: yes.  Confirms.              Anus:  normal sphincter tone, no lesions  Chaperone was present for exam:  Joy, CMA.  Assessment:   Well woman visit with gynecologic exam. Status post robotic hysterectomy, bilateral salpingectomy, right oophorectomy 06/11/17.  ERT. Migraine with aura.  Asthma.  Left pelvic mass.  Plan: Mammogram screening discussed. Self breast awareness reviewed. Pap and HR HPV as above. Guidelines for Calcium, Vitamin D, regular exercise program including cardiovascular and weight bearing exercise. She will continue her ERT.  I discussed WHI and risk of  stroke, DVT, and PE. Return for pelvic ultrasound.  Follow up annually and prn.   After visit summary provided.

## 2021-09-13 ENCOUNTER — Encounter: Payer: Self-pay | Admitting: Obstetrics and Gynecology

## 2021-09-13 ENCOUNTER — Other Ambulatory Visit: Payer: Self-pay

## 2021-09-13 ENCOUNTER — Ambulatory Visit (INDEPENDENT_AMBULATORY_CARE_PROVIDER_SITE_OTHER): Payer: BC Managed Care – PPO | Admitting: Obstetrics and Gynecology

## 2021-09-13 VITALS — BP 120/76 | HR 76 | Resp 16 | Ht 60.75 in | Wt 138.0 lb

## 2021-09-13 DIAGNOSIS — R1900 Intra-abdominal and pelvic swelling, mass and lump, unspecified site: Secondary | ICD-10-CM

## 2021-09-13 DIAGNOSIS — Z01419 Encounter for gynecological examination (general) (routine) without abnormal findings: Secondary | ICD-10-CM | POA: Diagnosis not present

## 2021-09-13 MED ORDER — ESTRADIOL 1 MG PO TABS: 1.0000 mg | ORAL_TABLET | Freq: Every day | ORAL | 3 refills | Status: DC

## 2021-09-13 NOTE — Patient Instructions (Signed)

## 2021-10-03 NOTE — Progress Notes (Signed)
GYNECOLOGY  VISIT   HPI: 55 y.o.   Married  [place] American  female   [phone] with Patient's last menstrual period was 03/29/2017.   here for ultrasound for 4 cm left adnexal mass on routine pelvic exam on 09/13/21. She has had a robotic hysterectomy with bilateral salpingectomy, right oophorectomy in 2018.   BMs can range from more than one a day to skipping a day.  Husband is present for the visit today.  GYNECOLOGIC HISTORY: Patient's last menstrual period was 03/29/2017. Contraception:  hysterectomy Menopausal hormone therapy:  none Last mammogram:  06-30-21 normal Last pap smear:   5/17 normal per patient        OB History     Gravida  3   Para  2   Term  0   Preterm  0   AB  1   Living  2      SAB  1   IAB  0   Ectopic  0   Multiple  0   Live Births  0              Patient Active Problem List   Diagnosis Date Noted   Migraine with aura and without status migrainosus, not intractable 12/14/2019   Chronic migraine without aura without status migrainosus, not intractable 09/09/2017   Ileus ([place]) 06/21/2017   Ileus, postoperative ([place]) 06/21/2017   Uterine fibroid 06/11/2017   Iron deficiency anemia due to chronic blood loss 04/08/2017   Leiomyoma, intramural 04/08/2017   Fibroids 03/31/2017    Past Medical History:  Diagnosis Date   Abnormal uterine bleeding    Allergic rhinitis    Anemia    Asthma    Depression    hx of   Fibroid    History of COVID-19 05/30/2020   Pos for Delta variant   History of esophagogastroduodenoscopy (EGD) 2016   H pylori +   Hypercholesterolemia    Migraine    with aura   Patient is Jehovah's Witness    Vitamin D deficiency     Past Surgical History:  Procedure Laterality Date   CHOLECYSTECTOMY N/A 07/22/2020   Procedure: LAPAROSCOPIC CHOLECYSTECTOMY;  Surgeon: Georganna Skeans, MD;  Location: [facility];  Service: General;  Laterality: N/A;   ROBOTIC ASSISTED TOTAL HYSTERECTOMY Bilateral 06/11/2017    Procedure: XI ROBOTIC ASSISTED TOTAL HYSTERECTOMY BILATERAL SALPINGECTOMY, RIGHT OOPHERECTOMY,  LYSIS OF ADHESIONS ,MINI LAPAROTOMY;  Surgeon: Everitt Amber, MD;  Location[facility];  Service: Gynecology;  Laterality: Bilateral;   TUBAL LIGATION     then reversal   UTERINE FIBROID SURGERY      Current Outpatient Medications  Medication Sig Dispense Refill   albuterol (PROVENTIL HFA;VENTOLIN HFA) 108 (90 BASE) MCG/ACT inhaler Inhale 1-2 puffs into the lungs every 6 (six) hours as needed for wheezing or shortness of breath.     albuterol (PROVENTIL) (5 MG/ML) 0.5% nebulizer solution Take 2.5 mg by nebulization every 6 (six) hours as needed for wheezing or shortness of breath.     aspirin-acetaminophen-caffeine (EXCEDRIN MIGRAINE) 250-250-65 MG tablet Take 2 tablets by mouth every 6 (six) hours as needed for migraine.      Biotin 10000 MCG TBDP Take 10,000 mcg by mouth at bedtime.     EMGALITY 120 MG/ML SOAJ ADMINISTER 1 ML(120MG ) UNDER THE SKIN EVERY 30 DAYS 1 mL 0   estradiol (ESTRACE) 1 MG tablet Take 1 tablet (1 mg total) by mouth at bedtime. 90 tablet 3   fluticasone-salmeterol (ADVAIR HFA) 45-21 MCG/ACT inhaler  Inhale 2 puffs into the lungs 2 (two) times daily.     mometasone-formoterol (DULERA) 200-5 MCG/ACT AERO Inhale 2 puffs into the lungs 2 (two) times daily.     Ubrogepant (UBRELVY) 100 MG TABS TAKE 1 TABLET BY MOUTH EVERY 2 HOURS AS NEEDED, MAXIMUM DAILY DOSE IS 2 TABLETS 10 tablet 11   No current facility-administered medications for this visit.     ALLERGIES: Codeine, Kiwi extract, Levetiracetam, Other, and Powder  Family History  Problem Relation Age of Onset   Hypertension Mother    Diabetes Mother    Osteoporosis Mother    Hypertension Father    Prostate cancer Father    Heart attack Father    Kidney cancer Sister     Social History   Socioeconomic History   Marital status: Married    Spouse name: Not on file   Number of children: 2   Years of education: Not on file    Highest education level: Some college, no degree  Occupational History   Not on file  Tobacco Use   Smoking status: Never   Smokeless tobacco: Never  Vaping Use   Vaping Use: Never used  Substance and Sexual Activity   Alcohol use: Yes    Alcohol/week: 3.0 standard drinks    Types: 3 Cans of beer per week   Drug use: No   Sexual activity: Yes    Birth control/protection: Surgical    Comment: hysterectomy  Other Topics Concern   Not on file  Social History Narrative   Lives at home with husband   Caffeine: 1 cup coffee every other day   Right handed predominantly, uses both hands      Social Determinants of Health   Financial Resource Strain: Not on file  Food Insecurity: Not on file  Transportation Needs: Not on file  Physical Activity: Not on file  Stress: Not on file  Social Connections: Not on file  Intimate Partner Violence: Not on file    Review of Systems  Constitutional: Negative.   HENT: Negative.    Eyes: Negative.   Respiratory: Negative.    Cardiovascular: Negative.   Gastrointestinal: Negative.   Endocrine: Negative.   Genitourinary: Negative.   Musculoskeletal: Negative.   Skin: Negative.   Allergic/Immunologic: Negative.   Neurological: Negative.   Hematological: Negative.   Psychiatric/Behavioral: Negative.     PHYSICAL EXAMINATION:    BP 108/64    Pulse 75    Resp 16    LMP 03/29/2017     General appearance: alert, cooperative and appears stated age   Pelvic: External genitalia:  no lesions              Urethra:  normal appearing urethra with no masses, tenderness or lesions              Bartholins and Skenes: normal                 Vagina: normal appearing vagina with normal color and discharge, no lesions              Cervix: absent                Bimanual Exam:  Uterus: absent              Adnexa: no mass, fullness, tenderness on right.  4 cm mass in left adnexal region, slightly tender.              Rectal exam: yes.  Confirms.  Anus:  normal sphincter tone, no lesions  Chaperone was present for exam:  Joy, CMA  Pelvic US - transabdominal and transvaginal Uterus absent.  Right ovary absent.  Left ovary not identified.  No pelvic mass or free fluid noted abdominally or vaginally.   ASSESSMENT  Right adnexal mass.  Possible loop of intestine.  Normal pelvic ultrasound.    PLAN  Pelvic US images and report reviewed.  Will proceed with CT of abdomen and pelvis with contrast.  Rational explained.  FU prn.    An After Visit Summary was printed and given to the patient.

## 2021-10-05 ENCOUNTER — Ambulatory Visit (INDEPENDENT_AMBULATORY_CARE_PROVIDER_SITE_OTHER): Payer: BC Managed Care – PPO | Admitting: Obstetrics and Gynecology

## 2021-10-05 ENCOUNTER — Encounter: Payer: Self-pay | Admitting: Obstetrics and Gynecology

## 2021-10-05 ENCOUNTER — Ambulatory Visit (INDEPENDENT_AMBULATORY_CARE_PROVIDER_SITE_OTHER): Payer: BC Managed Care – PPO

## 2021-10-05 ENCOUNTER — Other Ambulatory Visit: Payer: Self-pay

## 2021-10-05 ENCOUNTER — Telehealth: Payer: Self-pay | Admitting: Obstetrics and Gynecology

## 2021-10-05 VITALS — BP 108/64 | HR 75 | Resp 16

## 2021-10-05 DIAGNOSIS — R1900 Intra-abdominal and pelvic swelling, mass and lump, unspecified site: Secondary | ICD-10-CM | POA: Diagnosis not present

## 2021-10-05 DIAGNOSIS — R1909 Other intra-abdominal and pelvic swelling, mass and lump: Secondary | ICD-10-CM

## 2021-10-05 DIAGNOSIS — N9489 Other specified conditions associated with female genital organs and menstrual cycle: Secondary | ICD-10-CM

## 2021-10-05 NOTE — Patient Instructions (Signed)
CT Scan A CT scan (computed tomography scan) is an imaging scan. It uses X-rays and a computer to make detailed pictures of different areas inside the body. A CT scan can give more information than a regular X-ray exam. A CT scan provides data about internal organs, soft tissue structures, blood vessels, and bones. In this procedure, the pictures will be taken in a large machine that has an opening (CT scanner). Tell a health care provider about: Any allergies you have. All medicines you are taking, including vitamins, herbs, eye drops, creams, and over-the-counter medicines. Any blood disorders you have. Any surgeries you have had. Any medical conditions you have. Whether you are pregnant or may be pregnant. What are the risks? Generally, this is a safe procedure. However, problems may occur, including: An allergic reaction to dye injected during the procedure. Development of cancer from excessive exposure to radiation from multiple CT scans. This is rare. What happens before the procedure? Staying hydrated Follow instructions from your health care provider about hydration, which may include: Up to 2 hours before the procedure - you may continue to drink clear liquids, such as water, clear fruit juice, black coffee, and plain tea. Eating and drinking restrictions Follow instructions from your health care provider about eating and drinking, which may include: 24 hours before the procedure - stop drinking caffeinated beverages, such as energy drinks, tea, soda, coffee, and hot chocolate. 8 hours before the procedure - stop eating heavy meals or foods such as meat, fried foods, or fatty foods. 6 hours before the procedure - stop eating light meals or foods, such as toast or cereal. 6 hours before the procedure - stop drinking milk or drinks that contain milk. 2 hours before the procedure - stop drinking clear liquids. General instructions Remove any jewelry. Ask your health care provider  about changing or stopping your regular medicines. This is especially important if you are taking diabetes medicines or blood thinners. What happens during the procedure? An IV tube may be inserted into one of your veins. The contrast dye may be injected into the IV tube. You may feel warm or have a metallic taste in your mouth. You will lie on a table with your arms above your head. The table you will be lying on will move into the CT scanner. You will be able to see, hear, and talk to the person running the machine while you are in it. Follow that person's instructions. The CT scanner will move around you to take pictures. Do not move while it is scanning. Staying still helps the scanner to get a good image. When the best possible pictures have been taken, the machine will be turned off. The table will be moved out of the machine. The IV tube will be removed. The procedure may vary among health care providers and hospitals. What happens after the procedure? It is up to you to get the results of your procedure. Ask your health care provider, or the department that is doing the procedure, when your results will be ready. Summary A CT scan is an imaging scan. A CT scan uses X-rays and a computer to make detailed pictures of different areas of your body. Follow instructions from your health care provider about eating and drinking before the procedure. You will be able to see, hear, and talk to the person running the machine while you are in it. Follow that person's instructions. This information is not intended to replace advice given to you by your  health care provider. Make sure you discuss any questions you have with your health care provider. Document Revised: 06/07/2021 Document Reviewed: 08/25/2020 Elsevier Patient Education  2022 [employer].

## 2021-10-05 NOTE — Telephone Encounter (Signed)
Please schedule CT of abdomen and pelvis with contrast at [place] for left adnexal mass.   Patient has a left adnexal mass and normal pelvic ultrasound in the office.

## 2021-10-10 NOTE — Telephone Encounter (Signed)
Patient scheduled on 10/31/21 @ 9:00am for the below.  Encounter sent to [facility] to check for precert.

## 2021-10-12 NOTE — Telephone Encounter (Signed)
Late entry [facility] sent me a staff message saying the below   "Can you place a new CT order for this patient with the diagnosis code of R19.09? The diagnosis code is wrong in [place], and [name] is aware. We've put in a ticket to IT, but we need to go ahead and change it on the order because the patient's appointment is coming up. "   New order placed and new diagnosis code placed for the above as well. Patient is still scheduled at the same date/time.

## 2021-10-31 ENCOUNTER — Other Ambulatory Visit: Payer: BC Managed Care – PPO

## 2021-10-31 ENCOUNTER — Ambulatory Visit
Admission: RE | Admit: 2021-10-31 | Discharge: 2021-10-31 | Disposition: A | Discharge status: Home / Self Care | Payer: BC Managed Care – PPO | Source: Ambulatory Visit | Attending: Obstetrics and Gynecology | Admitting: Obstetrics and Gynecology

## 2021-10-31 ENCOUNTER — Other Ambulatory Visit: Payer: Self-pay

## 2021-10-31 DIAGNOSIS — R109 Unspecified abdominal pain: Secondary | ICD-10-CM | POA: Diagnosis not present

## 2021-10-31 DIAGNOSIS — Z9049 Acquired absence of other specified parts of digestive tract: Secondary | ICD-10-CM | POA: Diagnosis not present

## 2021-10-31 DIAGNOSIS — R112 Nausea with vomiting, unspecified: Secondary | ICD-10-CM | POA: Diagnosis not present

## 2021-10-31 DIAGNOSIS — Z90710 Acquired absence of both cervix and uterus: Secondary | ICD-10-CM | POA: Diagnosis not present

## 2021-10-31 DIAGNOSIS — R1909 Other intra-abdominal and pelvic swelling, mass and lump: Secondary | ICD-10-CM

## 2021-10-31 MED ORDER — IOPAMIDOL (ISOVUE-300) INJECTION 61%
100.0000 mL | Freq: Once | INTRAVENOUS | Status: AC | PRN
  Administered 2021-10-31: 100 mL via INTRAVENOUS

## 2021-11-10 DIAGNOSIS — Z8709 Personal history of other diseases of the respiratory system: Secondary | ICD-10-CM | POA: Diagnosis not present

## 2021-11-10 DIAGNOSIS — R059 Cough, unspecified: Secondary | ICD-10-CM | POA: Diagnosis not present

## 2021-11-10 DIAGNOSIS — J209 Acute bronchitis, unspecified: Secondary | ICD-10-CM | POA: Diagnosis not present

## 2021-11-10 DIAGNOSIS — J019 Acute sinusitis, unspecified: Secondary | ICD-10-CM | POA: Diagnosis not present

## 2021-11-14 NOTE — Patient Instructions (Incomplete)
Below is our plan:  We will ***  Please make sure you are staying well hydrated. I recommend 50-60 ounces daily. Well balanced diet and regular exercise encouraged. Consistent sleep schedule with 6-8 hours recommended.   Please continue follow up with care team as directed.   Follow up with *** in ***  You may receive a survey regarding today's visit. I encourage you to leave honest feed back as I do use this information to improve patient care. Thank you for seeing me today!   Migraine with aura: There is increased risk for stroke in women with migraine with aura and a contraindication for the combined contraceptive pill for use by women who have migraine with aura. The risk for women with migraine without aura is lower. However other risk factors like smoking are far more likely to increase stroke risk than migraine. There is a recommendation for no smoking and for the use of OCPs without estrogen such as progestogen only pills particularly for women with migraine with aura.Stephanie Hancock People who have migraine headaches with auras may be 3 times more likely to have a stroke caused by a blood clot, compared to migraine patients who don't see auras. Women who take hormone-replacement therapy may be 30 percent more likely to suffer a clot-based stroke than women not taking medication containing estrogen. Other risk factors like smoking and high blood pressure may be  much more important.   Magnesium: may help with headaches are aura, the best evidence for magnesium is for migraine with aura is its thought to stop the cortical spreading depression we believe is the pathophysiology of migraine aura.  To prevent or relieve headaches, try the following: Cool Compress. Lie down and place a cool compress on your head.  Avoid headache triggers. If certain foods or odors seem to have triggered your migraines in the past, avoid them. A headache diary might help you identify triggers.  Include physical activity in  your daily routine. Try a daily walk or other moderate aerobic exercise.  Manage stress. Find healthy ways to cope with the stressors, such as delegating tasks on your to-do list.  Practice relaxation techniques. Try deep breathing, yoga, massage and visualization.  Eat regularly. Eating regularly scheduled meals and maintaining a healthy diet might help prevent headaches. Also, drink plenty of fluids.  Follow a regular sleep schedule. Sleep deprivation might contribute to headaches Consider biofeedback. With this mind-body technique, you learn to control certain bodily functions -- such as muscle tension, heart rate and blood pressure -- to prevent headaches or reduce headache pain.      Proceed to emergency room if you experience new or worsening symptoms or symptoms do not resolve, if you have new neurologic symptoms or if headache is severe, or for any concerning symptom.

## 2021-11-14 NOTE — Progress Notes (Deleted)
PATIENT: Stephanie Hancock DOB: [birth year]  REASON FOR VISIT: follow up HISTORY FROM: patient  No chief complaint on file.    HISTORY OF PRESENT ILLNESS: 11/14/21 ALL:   11/16/2020 ALL:  She returns for migraine follow up. She continues Stephanie Hancock.  She reports that she is doing really well.  She is tolerating medications without obvious adverse effects.  She continues to have about 10 migraine days per month.  She is using 5-8 doses of Ubrelvy each month.  Occasionally she will use Excedrin with Ubrelvy.  She feels that migraines are very well managed at this time.  She does note that she occasionally has black dots/bugs in her peripheral vision prior to headache.  Abortive medicines help with this.  Symptoms are not new and have improved significantly with migraine management.  Eye exam last over a year ago.  No other concerns of hallucinations or mood changes.   05/16/2020 ALL: Stephanie Hancock is a 56 y.o. female here today for follow up for migraines. She was started on Emgality in 12/2019. She has had 4 injections. Allergies (rash, whelps) with Amovig and Ajovy. She has not needed to Benadryl. She reports migraines are improved in frequency and intensity. She reports having about 8 migraines a month. Roselyn Meier helps with abortive therapy. She uses Excedrin as well and feels she uses it more often than she should. She continues discussion of weaning Estrace with PCP.   She is planning to have a cholecystectomy on 8/31. She has 12 tablets of oxycodone for post op. She does not take this medication. She is eating differently which she feels has helped. She has eliminated fried and spicy foods. She is eating more fruits. She is drinking more water.    HISTORY: (copied from Dr Cathren Laine note on 12/14/2019)  12/14/2019: We have not seen patient since 2018, here for migraines In the past she tried multiple medications (see below). She states since she saw [place] she has tried aimovig with a bad rash.  The can be on either side, behind the eye, can last 24-72 hours and moderately severe to severe. Pulsating, pounding, throbbing, light and sound sensitivity, smells cam trigger and bother, she has a UV light, light can trigger even natural sunlight. 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 She has had them since the age of 34. Worsening in the last year. She takes excedrin but understands about medication overuse. They cn be severe. Sleep helps, excedrin helps. No other focal neurologic deficits, associated symptoms, inciting events or modifiable factors. Ajovy had a large rash. She has a latex allergy cannot take aimovig.      Patient had hair loss with topiramate. Doesn't want to try Zonegran bc it has similar side effects. Medications tried: Sumatriptan po, zolmitriptan, nortriptyline, tizanidine, Keppra (hallucinations and suicidal ideation), excedrin migraine, advil. Amerge, Topamx, maxalt, steroids, Topiramate (allopecia), Zonegran (doesn't want to try bc may also have allopecia), Qudexy. By review of chart additional meds tried since being seen in 2018 include: Aimovig, excedrin, ibuprofen, imitrex injections, gabapentin, diclofenac tan relpax, reglan and ketorolac injections, zomig, tramadol, propranolol, zofran tablet.  Ajovy had a large rash. She has a latex allergy cannot take aimovig.      07/24/2017: showed No acute intracranial abnormalities including mass lesion or mass effect, hydrocephalus, extra-axial fluid collection, midline shift, hemorrhage, or acute infarction, large ischemic events (personally reviewed images).    07/2018 TSH normal     PRIOR Addendum Patient had hair loss with topiramate. Doesn't  want to try Zonegran bc it has similar side effects. Medications tried: Sumatriptan, zolmitriptan, nortriptyline, tizanidine, Keppra (hallucinations and suicidal ideation), excedrin migraine, advil. Amerge, Topamx, maxalt, steroids    Medications tried: Sumatriptan, zolmitriptan,  nortriptyline, tizanidine, Keppra (hallucinations and suicidal ideation), excedrin migraine, advil. Amerge, Topamx, maxalt, steroids, Topiramate (allopecia), Zonegran (doesn't want to try bc may also have allopecia), Qudexy.    HPI:  Stephanie Hancock is a 56 y.o. female here as a referral from Dr. Stephanie Acre for migraines.  Past medical history of asthma, migraines, anemia, uterine fibroids, rhinitis, H. pylori, vitamin D deficiency. Mother with migraines. Daughter with migraines. She has had migraines since the age of 31. She used to get an aura, "bouncy orbs or black and white lines", no auras now. She takes excedrin excessively, +overuse. No aura. Daily headaches, she has 14 migrainous a month for years and can last all day. If she didn't treat she would have daily migraines. They can be unilateral, pounding, throbbing, behind the eye, pain in the left temple, nausea, vomiting, light and sound sensitivity. Lights aggravate. Sitting still helps in a dark room. Weather changes with aggravate her migraines.    Medications tried: Sumatriptan, zolmitriptan, nortriptyline, tizanidine, Keppra (hallucinations and suicidal ideation), excedrin migraine, advil. Amerge, Topamx, maxalt, steroids    Reviewed notes, labs and imaging from outside physicians, which showed:   MRI brain 07/2017 showed No acute intracranial abnormalities including mass lesion or mass effect, hydrocephalus, extra-axial fluid collection, midline shift, hemorrhage, or acute infarction, large ischemic events (personally reviewed images)   CBC with anemia 11.2/33.5, BMP elevated glusoce. 06/2017.   Medications tried: Sumatriptan, zolmitriptan, nortriptyline, tizanidine.  Patient was being seen by the [facility] and he retired.   REVIEW OF SYSTEMS: Out of a complete 14 system review of symptoms, the patient complains only of the following symptoms, headaches, hot flashes and all other reviewed systems are  negative.  ALLERGIES: Allergies  Allergen Reactions   Codeine Hives   Kiwi Extract Swelling and Other (See Comments)    Tingling in throat/mouth swells.   Levetiracetam Other (See Comments)    HALLUCINATIONS W/SUICIDAL IDEATION   Other     NO BLOOD PRODUCTS    Powder Itching and Other (See Comments)    LATEX GLOVES WITH POWDER CAUSES ITCHING    HOME MEDICATIONS: Outpatient Medications Prior to Visit  Medication Sig Dispense Refill   albuterol (PROVENTIL HFA;VENTOLIN HFA) 108 (90 BASE) MCG/ACT inhaler Inhale 1-2 puffs into the lungs every 6 (six) hours as needed for wheezing or shortness of breath.     albuterol (PROVENTIL) (5 MG/ML) 0.5% nebulizer solution Take 2.5 mg by nebulization every 6 (six) hours as needed for wheezing or shortness of breath.     aspirin-acetaminophen-caffeine (EXCEDRIN MIGRAINE) 250-250-65 MG tablet Take 2 tablets by mouth every 6 (six) hours as needed for migraine.      Biotin 10000 MCG TBDP Take 10,000 mcg by mouth at bedtime.     EMGALITY 120 MG/ML SOAJ ADMINISTER 1 ML(120MG ) UNDER THE SKIN EVERY 30 DAYS 1 mL 0   estradiol (ESTRACE) 1 MG tablet Take 1 tablet (1 mg total) by mouth at bedtime. 90 tablet 3   fluticasone-salmeterol (ADVAIR HFA) 45-21 MCG/ACT inhaler Inhale 2 puffs into the lungs 2 (two) times daily.     mometasone-formoterol (DULERA) 200-5 MCG/ACT AERO Inhale 2 puffs into the lungs 2 (two) times daily.     Ubrogepant (UBRELVY) 100 MG TABS TAKE 1 TABLET BY MOUTH EVERY 2 HOURS AS NEEDED,  MAXIMUM DAILY DOSE IS 2 TABLETS 10 tablet 11   No facility-administered medications prior to visit.    PAST MEDICAL HISTORY: Past Medical History:  Diagnosis Date   Abnormal uterine bleeding    Allergic rhinitis    Anemia    Asthma    Depression    hx of   Fibroid    History of COVID-19 05/30/2020   Pos for Delta variant   History of esophagogastroduodenoscopy (EGD) 2016   H pylori +   Hypercholesterolemia    Migraine    with aura   Patient is  Jehovah's Witness    Vitamin D deficiency     PAST SURGICAL HISTORY: Past Surgical History:  Procedure Laterality Date   CHOLECYSTECTOMY N/A 07/22/2020   Procedure: LAPAROSCOPIC CHOLECYSTECTOMY;  Surgeon: Georganna Skeans, MD;  Location: [facility];  Service: General;  Laterality: N/A;   ROBOTIC ASSISTED TOTAL HYSTERECTOMY Bilateral 06/11/2017   Procedure: XI ROBOTIC ASSISTED TOTAL HYSTERECTOMY BILATERAL SALPINGECTOMY, RIGHT OOPHERECTOMY,  LYSIS OF ADHESIONS ,MINI LAPAROTOMY;  Surgeon: Everitt Amber, MD;  Location: [facility];  Service: Gynecology;  Laterality: Bilateral;   TUBAL LIGATION     then reversal   UTERINE FIBROID SURGERY      FAMILY HISTORY: Family History  Problem Relation Age of Onset   Hypertension Mother    Diabetes Mother    Osteoporosis Mother    Hypertension Father    Prostate cancer Father    Heart attack Father    Kidney cancer Sister     SOCIAL HISTORY: Social History   Socioeconomic History   Marital status: Married    Spouse name: Not on file   Number of children: 2   Years of education: Not on file   Highest education level: Some college, no degree  Occupational History   Not on file  Tobacco Use   Smoking status: Never   Smokeless tobacco: Never  Vaping Use   Vaping Use: Never used  Substance and Sexual Activity   Alcohol use: Yes    Alcohol/week: 3.0 standard drinks    Types: 3 Cans of beer per week   Drug use: No   Sexual activity: Yes    Birth control/protection: Surgical    Comment: hysterectomy  Other Topics Concern   Not on file  Social History Narrative   Lives at home with husband   Caffeine: 1 cup coffee every other day   Right handed predominantly, uses both hands      Social Determinants of Health   Financial Resource Strain: Not on file  Food Insecurity: Not on file  Transportation Needs: Not on file  Physical Activity: Not on file  Stress: Not on file  Social Connections: Not on file  Intimate Partner Violence: Not on file       PHYSICAL EXAM  There were no vitals filed for this visit.  There is no height or weight on file to calculate BMI.  Generalized: Well developed, in no acute distress  Cardiology: normal rate and rhythm, no murmur noted Respiratory: clear to auscultation bilaterally  Neurological examination  Mentation: Alert oriented to time, place, history taking. Follows all commands speech and language fluent Cranial nerve II-XII: Pupils were equal round reactive to light. Extraocular movements were full, visual field were full  Motor: The motor testing reveals 5 over 5 strength of all 4 extremities. Good symmetric motor tone is noted throughout.  Gait and station: Gait is normal.    DIAGNOSTIC DATA (LABS, IMAGING, TESTING) - I reviewed patient  records, labs, notes, testing and imaging myself where available.  No flowsheet data found.   Lab Results  Component Value Date   WBC 4.8 07/15/2020   HGB 12.6 07/15/2020   HCT 38.5 07/15/2020   MCV 91.9 07/15/2020   PLT 267 07/15/2020      Component Value Date/Time   NA 140 07/15/2020 0955   NA 140 04/04/2017 1636   K 3.9 07/15/2020 0955   CL 106 07/15/2020 0955   CO2 25 07/15/2020 0955   GLUCOSE 97 07/15/2020 0955   BUN 11 07/15/2020 0955   BUN 14 04/04/2017 1636   CREATININE 0.76 07/15/2020 0955   CALCIUM 9.5 07/15/2020 0955   PROT 7.2 06/02/2020 0929   ALBUMIN 4.0 06/02/2020 0929   AST 22 06/02/2020 0929   ALT 28 06/02/2020 0929   ALKPHOS 72 06/02/2020 0929   BILITOT 0.3 06/02/2020 0929   GFRNONAA >60 07/15/2020 0955   GFRAA >60 06/02/2020 0929   No results found for: CHOL, HDL, LDLCALC, LDLDIRECT, TRIG, CHOLHDL No results found for: HGBA1C No results found for: VITAMINB12 Lab Results  Component Value Date   TSH 2.520 07/23/2018       ASSESSMENT AND PLAN 56 y.o. year old female  has a past medical history of Abnormal uterine bleeding, Allergic rhinitis, Anemia, Asthma, Depression, Fibroid, History of COVID-19  (05/30/2020), History of esophagogastroduodenoscopy (EGD) (2016), Hypercholesterolemia, Migraine, Patient is Jehovah's Witness, and Vitamin D deficiency. here with   No diagnosis found.    Allyna continues to feel that migraines are well managed on current regimen.  Vision changes seem to be correlated with migraine with aura.  Fortunately, symptoms have improved and are aborted with Ubrelvy.  MRI was normal in 2018.  We will continue Emgality every month and Ubrelvy as needed.  She may use Excedrin sparingly.  She will continue close follow-up with primary care.  Healthy lifestyle habits encouraged.  She will follow-up with [place] in 1 year, sooner if needed.  She verbalizes understanding and agreement with this plan.   No orders of the defined types were placed in this encounter.    No orders of the defined types were placed in this encounter.     Debbora Presto, FNP-C 11/14/2021, 1:23 PM [facility] [address] [name], [place] [address] [phone]

## 2021-11-16 ENCOUNTER — Ambulatory Visit: Payer: BC Managed Care – PPO | Admitting: Family Medicine

## 2021-11-16 DIAGNOSIS — G43109 Migraine with aura, not intractable, without status migrainosus: Secondary | ICD-10-CM

## 2021-11-27 IMAGING — US US ABDOMEN LIMITED
1 series · 14 of 25 positions shown · non-contrast
Comparison: CT 06/21/2017

CLINICAL DATA: Abdomen pain

EXAM:
ULTRASOUND ABDOMEN LIMITED RIGHT UPPER QUADRANT

[Series 1: us abdomen limited · 14 of 55 slices shown]
[im 1/55]
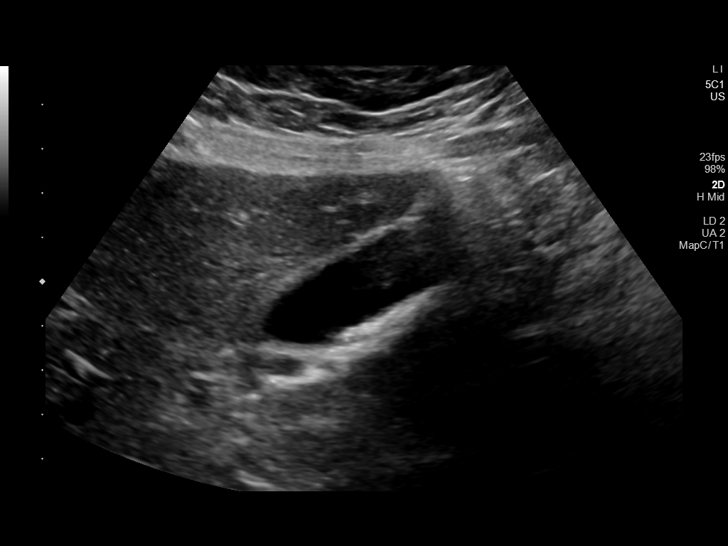
[im 5/55]
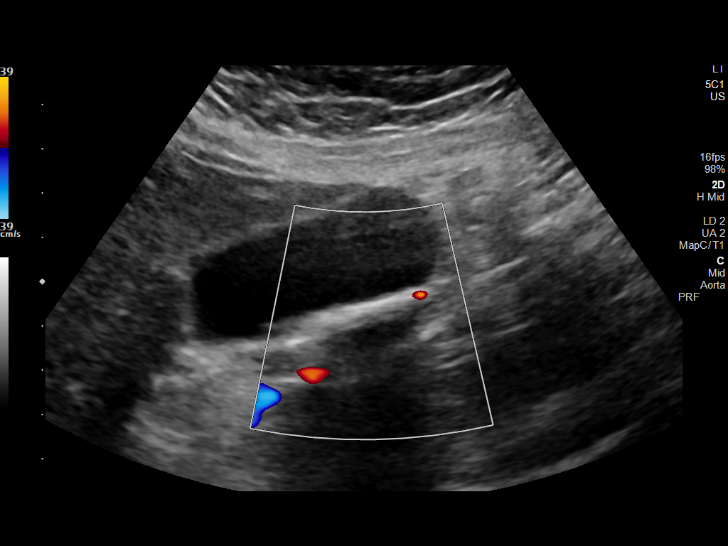
[im 10/55]
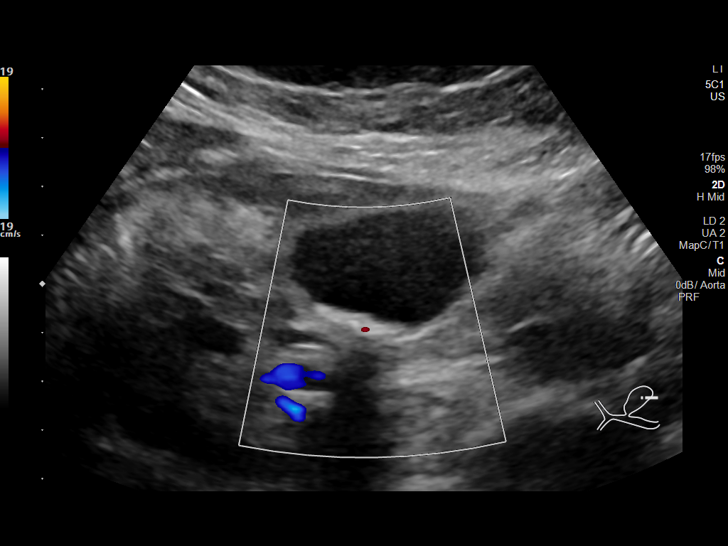
[im 14/55]
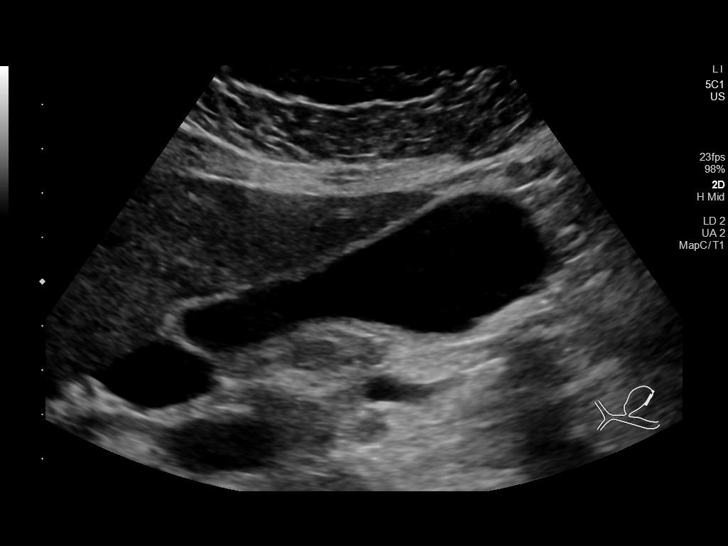
[im 19/55]
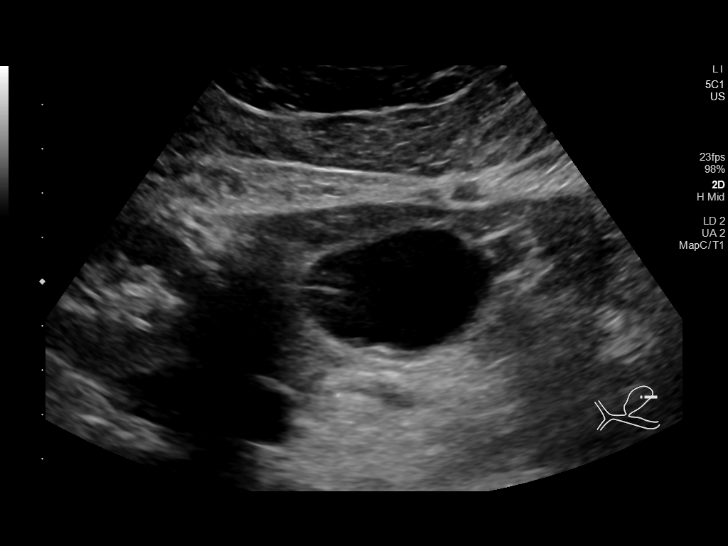
[im 21/55]
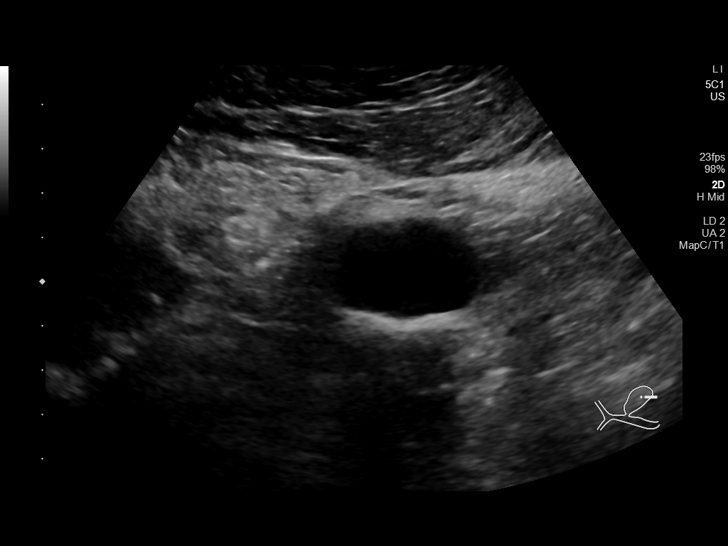
[im 25/55]
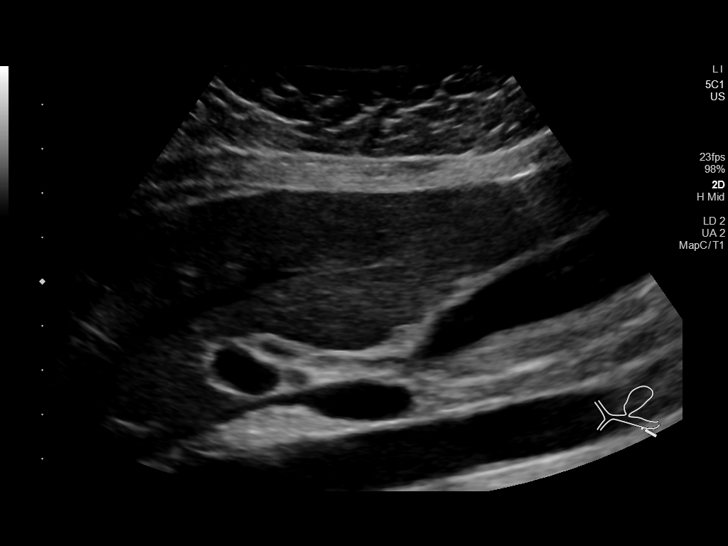
[im 30/55]
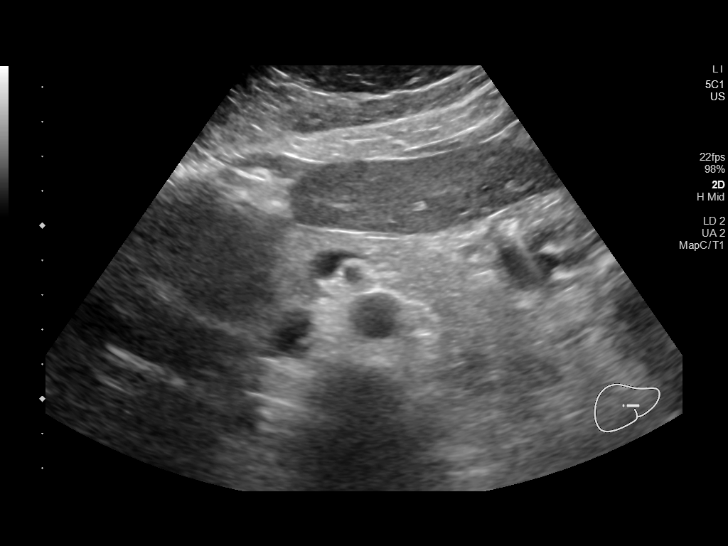
[im 34/55]
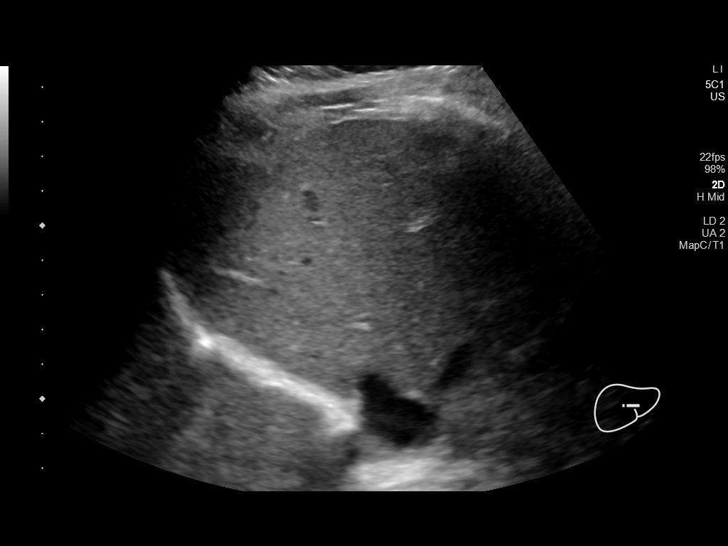
[im 37/55]
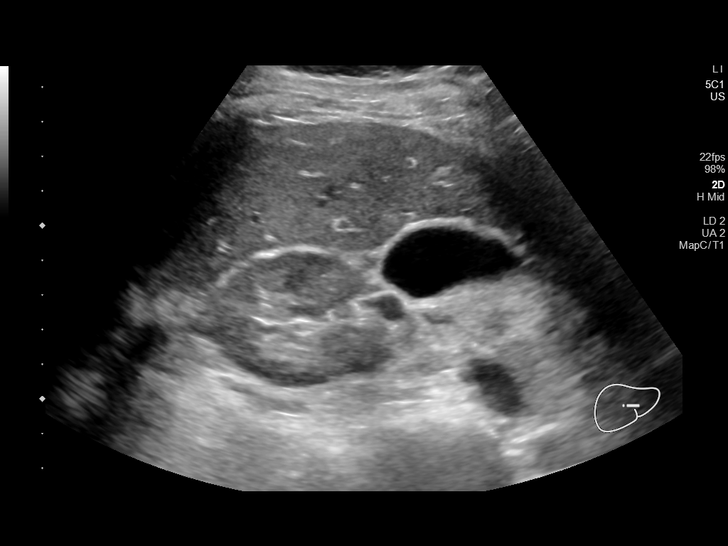
[im 41/55]
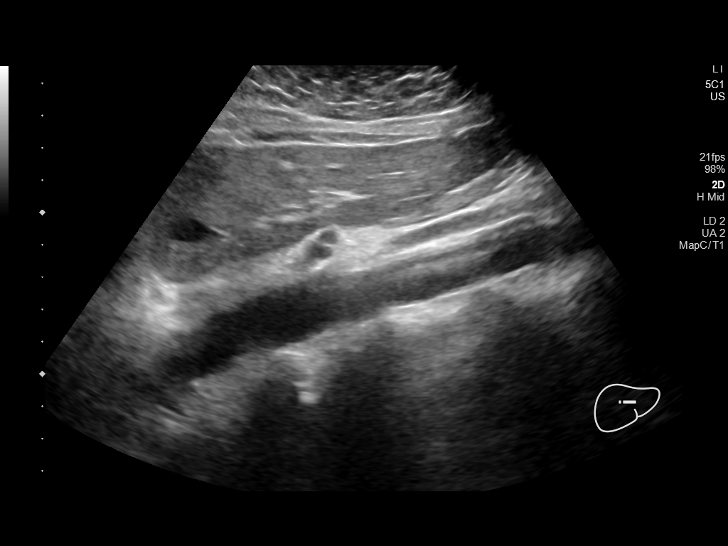
[im 46/55]
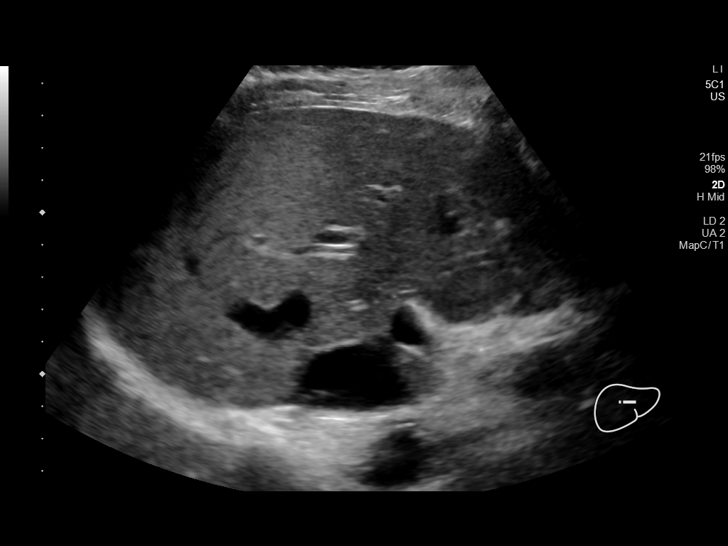
[im 50/55]
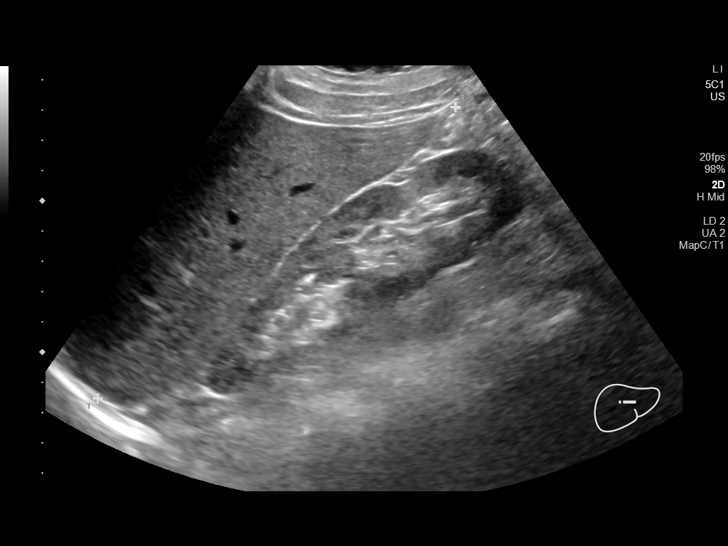
[im 55/55]
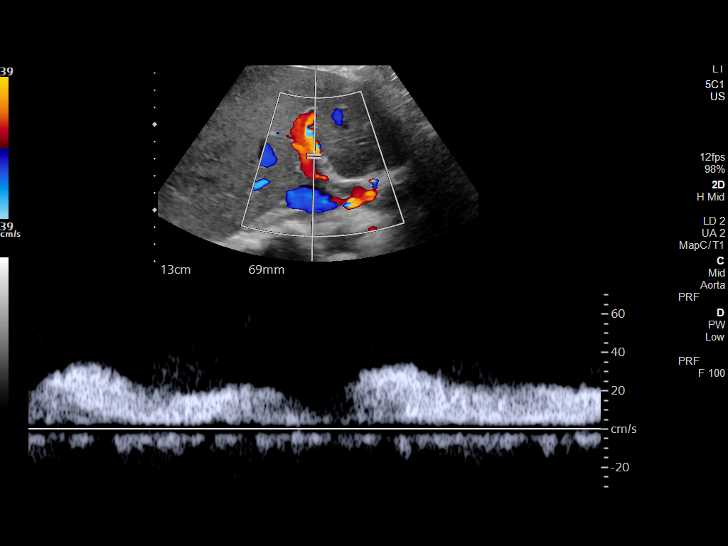

[14 of 25 positions shown; findings below may reference images not displayed]

FINDINGS: Gallbladder:

Mildly shadowing echogenicity along the dependent portion of the
bladder. Normal wall thickness. Negative sonographic Murphy.

Common bile duct:

Diameter: 2.9 mm

Liver:

No focal lesion identified. Within normal limits in parenchymal
echogenicity. Portal vein is patent on color Doppler imaging with
normal direction of blood flow towards the liver.

Other: None.
IMPRESSION: 1. Possible layering sandlike stones in the gallbladder. Negative
for acute cholecystitis or biliary dilatation.

## 2021-12-07 NOTE — Progress Notes (Deleted)
GYNECOLOGY  VISIT ?  ?HPI: ?56 y.o.   Married  [place] American  female   ?[id number] with Patient's last menstrual period was 03/29/2017.   ?here for    ? ?GYNECOLOGIC HISTORY: ?Patient's last menstrual period was 03/29/2017. ?Contraception: s/p hysterectomy ?Menopausal hormone therapy: Estradiol  ?Last mammogram: 06/30/21- birads 0, 07/12/21- birads 1 ?Last pap smear: 02/2016- WNL per pt ?       ?OB History   ? ? Gravida  ?3  ? Para  ?2  ? Term  ?0  ? Preterm  ?0  ? AB  ?1  ? Living  ?2  ?  ? ? SAB  ?1  ? IAB  ?0  ? Ectopic  ?0  ? Multiple  ?0  ? Live Births  ?0  ?   ?  ?  ?    ? ?Patient Active Problem List  ? Diagnosis Date Noted  ? Migraine with aura and without status migrainosus, not intractable 12/14/2019  ? Chronic migraine without aura without status migrainosus, not intractable 09/09/2017  ? Ileus ([place]) 06/21/2017  ? Ileus, postoperative ([place]) 06/21/2017  ? Uterine fibroid 06/11/2017  ? Iron deficiency anemia due to chronic blood loss 04/08/2017  ? 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 Fibroids 03/31/2017  ? ? ?Past Medical History:  ?Diagnosis Date  ? Abnormal uterine bleeding   ? Allergic rhinitis   ? Anemia   ? Asthma   ? Depression   ? hx of  ? Fibroid   ? History of COVID-19 05/30/2020  ? Pos for Delta variant  ? History of esophagogastroduodenoscopy (EGD) 2016  ? H pylori +  ? Hypercholesterolemia   ? Migraine   ? with aura  ? Patient is Jehovah's Witness   ? Vitamin D deficiency   ? ? ?Past Surgical History:  ?Procedure Laterality Date  ? CHOLECYSTECTOMY N/A 07/22/2020  ? Procedure: LAPAROSCOPIC CHOLECYSTECTOMY;  Surgeon: Georganna Skeans, MD;  Location: [facility];  Service: General;  Laterality: N/A;  ? ROBOTIC ASSISTED TOTAL HYSTERECTOMY Bilateral 06/11/2017  ? Procedure: XI ROBOTIC ASSISTED TOTAL HYSTERECTOMY BILATERAL SALPINGECTOMY, RIGHT OOPHERECTOMY,  LYSIS OF ADHESIONS ,MINI LAPAROTOMY;  Surgeon: Everitt Amber, MD;  Location: [facility];  Service: Gynecology;  Laterality: Bilateral;  ? TUBAL LIGATION    ? then  reversal  ? UTERINE FIBROID SURGERY    ? ? ?Current Outpatient Medications  ?Medication Sig Dispense Refill  ? albuterol (PROVENTIL HFA;VENTOLIN HFA) 108 (90 BASE) MCG/ACT inhaler Inhale 1-2 puffs into the lungs every 6 (six) hours as needed for wheezing or shortness of breath.    ? albuterol (PROVENTIL) (5 MG/ML) 0.5% nebulizer solution Take 2.5 mg by nebulization every 6 (six) hours as needed for wheezing or shortness of breath.    ? aspirin-acetaminophen-caffeine (EXCEDRIN MIGRAINE) 250-250-65 MG tablet Take 2 tablets by mouth every 6 (six) hours as needed for migraine.     ? Biotin 10000 MCG TBDP Take 10,000 mcg by mouth at bedtime.    ? EMGALITY 120 MG/ML SOAJ ADMINISTER 1 ML(120MG ) UNDER THE SKIN EVERY 30 DAYS 1 mL 0  ? estradiol (ESTRACE) 1 MG tablet Take 1 tablet (1 mg total) by mouth at bedtime. 90 tablet 3  ? fluticasone-salmeterol (ADVAIR HFA) 45-21 MCG/ACT inhaler Inhale 2 puffs into the lungs 2 (two) times daily.    ? mometasone-formoterol (DULERA) 200-5 MCG/ACT AERO Inhale 2 puffs into the lungs 2 (two) times daily.    ? Ubrogepant (UBRELVY) 100 MG TABS TAKE 1 TABLET BY MOUTH EVERY 2  HOURS AS NEEDED, MAXIMUM DAILY DOSE IS 2 TABLETS 10 tablet 11  ? ?No current facility-administered medications for this visit.  ?  ? ?ALLERGIES: Codeine, Kiwi extract, Levetiracetam, Other, and Powder ? ?Family History  ?Problem Relation Age of Onset  ? Hypertension Mother   ? Diabetes Mother   ? Osteoporosis Mother   ? Hypertension Father   ? Prostate cancer Father   ? Heart attack Father   ? Kidney cancer Sister   ? ? ?Social History  ? ?Socioeconomic History  ? Marital status: Married  ?  Spouse name: Not on file  ? Number of children: 2  ? Years of education: Not on file  ? Highest education level: Some college, no degree  ?Occupational History  ? Not on file  ?Tobacco Use  ? Smoking status: Never  ? Smokeless tobacco: Never  ?Vaping Use  ? Vaping Use: Never used  ?Substance and Sexual Activity  ? Alcohol use: Yes  ?   Alcohol/week: 3.0 standard drinks  ?  Types: 3 Cans of beer per week  ? Drug use: No  ? Sexual activity: Yes  ?  Birth control/protection: Surgical  ?  Comment: hysterectomy  ?Other Topics Concern  ? Not on file  ?Social History Narrative  ? Lives at home with husband  ? Caffeine: 1 cup coffee every other day  ? Right handed predominantly, uses both hands  ?   ? ?Social Determinants of Health  ? ?Financial Resource Strain: Not on file  ?Food Insecurity: Not on file  ?Transportation Needs: Not on file  ?Physical Activity: Not on file  ?Stress: Not on file  ?Social Connections: Not on file  ?Intimate Partner Violence: Not on file  ? ? ?Review of Systems ? ?PHYSICAL EXAMINATION:   ? ?LMP 03/29/2017     ?General appearance: alert, cooperative and appears stated age ?Head: Normocephalic, without obvious abnormality, atraumatic ?Neck: no adenopathy, supple, symmetrical, trachea midline and thyroid normal to inspection and palpation ?Lungs: clear to auscultation bilaterally ?Breasts: normal appearance, no masses or tenderness, No nipple retraction or dimpling, No nipple discharge or bleeding, No axillary or supraclavicular adenopathy ?Heart: regular rate and rhythm ?Abdomen: soft, non-tender, no masses,  no organomegaly ?Extremities: extremities normal, atraumatic, no cyanosis or edema ?Skin: Skin color, texture, turgor normal. No rashes or lesions ?Lymph nodes: Cervical, supraclavicular, and axillary nodes normal. ?No abnormal inguinal nodes palpated ?Neurologic: Grossly normal ? ?Pelvic: External genitalia:  no lesions ?             Urethra:  normal appearing urethra with no masses, tenderness or lesions ?             Bartholins and Skenes: normal    ?             Vagina: normal appearing vagina with normal color and discharge, no lesions ?             Cervix: no lesions ?               ?Bimanual Exam:  Uterus:  normal size, contour, position, consistency, mobility, non-tender ?             Adnexa: no mass, fullness,  tenderness ?             Rectal exam: {yes no:[id number]}.  Confirms. ?             Anus:  normal sphincter tone, no lesions ? ?Chaperone was present for exam:  *** ? ?ASSESSMENT ? ?  ? ?  PLAN ? ? ?  ?An After Visit Summary was printed and given to the patient. ? ?______ minutes face to face time of which over 50% was spent in counseling.  ? ?

## 2021-12-08 ENCOUNTER — Ambulatory Visit: Payer: BC Managed Care – PPO | Admitting: Obstetrics and Gynecology

## 2021-12-14 ENCOUNTER — Other Ambulatory Visit: Payer: Self-pay

## 2021-12-14 MED ORDER — UBRELVY 100 MG PO TABS: ORAL_TABLET | ORAL | 5 refills | Status: DC

## 2021-12-18 ENCOUNTER — Other Ambulatory Visit: Payer: Self-pay

## 2021-12-18 MED ORDER — EMGALITY 120 MG/ML ~~LOC~~ SOAJ: SUBCUTANEOUS | 0 refills | Status: DC

## 2021-12-25 NOTE — Progress Notes (Deleted)
? ? ?PATIENT: Stephanie Hancock ?DOB: Feb 17, 1966 ? ?REASON FOR VISIT: follow up ?HISTORY FROM: patient ? ?No chief complaint on file. ?  ? ?HISTORY OF PRESENT ILLNESS: ? ?12/25/21 ALL:  ?Stephanie Hancock returns for follow up for migraines. She continues [place].  ? ? ?11/16/2020 ALL: ?She returns for migraine follow up. She continues [place].  She reports that she is doing really well.  She is tolerating medications without obvious adverse effects.  She continues to have about 10 migraine days per month.  She is using 5-8 doses of Ubrelvy each month.  Occasionally she will use Excedrin with Ubrelvy.  She feels that migraines are very well managed at this time.  She does note that she occasionally has black dots/bugs in her peripheral vision prior to headache.  Abortive medicines help with this.  Symptoms are not new and have improved significantly with migraine management.  Eye exam last over a year ago.  No other concerns of hallucinations or mood changes. ? ? ?05/16/2020 ALL: ?Stephanie Hancock is a 56 y.o. female here today for follow up for migraines. She was started on Emgality in 12/2019. She has had 4 injections. Allergies (rash, whelps) with Amovig and Ajovy. She has not needed to Benadryl. She reports migraines are improved in frequency and intensity. She reports having about 8 migraines a month. Roselyn Meier helps with abortive therapy. She uses Excedrin as well and feels she uses it more often than she should. She continues discussion of weaning Estrace with PCP.  ? ?She is planning to have a cholecystectomy on 8/31. She has 12 tablets of oxycodone for post op. She does not take this medication. She is eating differently which she feels has helped. She has eliminated fried and spicy foods. She is eating more fruits. She is drinking more water.  ? ? ?HISTORY: (copied from Dr Cathren Laine note on 12/14/2019) ? ?12/14/2019: We have not seen patient since 2018, here for migraines In the past she tried multiple  medications (see below). She states since she saw [place] she has tried aimovig with a bad rash. The can be on either side, behind the eye, can last 24-72 hours and moderately severe to severe. Pulsating, pounding, throbbing, light and sound sensitivity, smells cam trigger and bother, she has a UV light, light can trigger even natural sunlight. 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 She has had them since the age of 56. Worsening in the last year. She takes excedrin but understands about medication overuse. They cn be severe. Sleep helps, excedrin helps. No other focal neurologic deficits, associated symptoms, inciting events or modifiable factors. Ajovy had a large rash. She has a latex allergy cannot take aimovig.  ?  ?  ?Patient had hair loss with topiramate. Doesn't want to try Zonegran bc it has similar side effects. Medications tried: Sumatriptan po, zolmitriptan, nortriptyline, tizanidine, Keppra (hallucinations and suicidal ideation), excedrin migraine, advil. Amerge, Topamx, maxalt, steroids, Topiramate (allopecia), Zonegran (doesn't want to try bc may also have allopecia), Qudexy. By review of chart additional meds tried since being seen in 2018 include: Aimovig, excedrin, ibuprofen, imitrex injections, gabapentin, diclofenac tan relpax, reglan and ketorolac injections, zomig, tramadol, propranolol, zofran tablet.  Ajovy had a large rash. She has a latex allergy cannot take aimovig.  ?  ?  ?07/24/2017: showed No acute intracranial abnormalities including mass lesion or mass effect, hydrocephalus, extra-axial fluid collection, midline shift, hemorrhage, or acute infarction, large ischemic events (personally reviewed images).  ?  ?07/2018 TSH  normal ?  ?  ?PRIOR ?Addendum Patient had hair loss with topiramate. Doesn't want to try Zonegran bc it has similar side effects. Medications tried: Sumatriptan, zolmitriptan, nortriptyline, tizanidine, Keppra (hallucinations and suicidal ideation), excedrin migraine, advil.  Amerge, Topamx, maxalt, steroids  ?  ?Medications tried: Sumatriptan, zolmitriptan, nortriptyline, tizanidine, Keppra (hallucinations and suicidal ideation), excedrin migraine, advil. Amerge, Topamx, maxalt, steroids, Topiramate (allopecia), Zonegran (doesn't want to try bc may also have allopecia), Qudexy.  ?  ?HPI:  Stephanie Hancock is a 56 y.o. female here as a referral from Dr. Stephanie Acre for migraines.  Past medical history of asthma, migraines, anemia, uterine fibroids, rhinitis, H. pylori, vitamin D deficiency. Mother with migraines. Daughter with migraines. She has had migraines since the age of 12. She used to get an aura, "bouncy orbs or black and white lines", no auras now. She takes excedrin excessively, +overuse. No aura. Daily headaches, she has 14 migrainous a month for years and can last all day. If she didn't treat she would have daily migraines. They can be unilateral, pounding, throbbing, behind the eye, pain in the left temple, nausea, vomiting, light and sound sensitivity. Lights aggravate. Sitting still helps in a dark room. Weather changes with aggravate her migraines.  ?  ?Medications tried: Sumatriptan, zolmitriptan, nortriptyline, tizanidine, Keppra (hallucinations and suicidal ideation), excedrin migraine, advil. Amerge, Topamx, maxalt, steroids  ?  ?Reviewed notes, labs and imaging from outside physicians, which showed: ?  ?MRI brain 07/2017 showed No acute intracranial abnormalities including mass lesion or mass effect, hydrocephalus, extra-axial fluid collection, midline shift, hemorrhage, or acute infarction, large ischemic events (personally reviewed images) ?  ?CBC with anemia 11.2/33.5, BMP elevated glusoce. 06/2017. ?  ?Medications tried: Sumatriptan, zolmitriptan, nortriptyline, tizanidine.  Patient was being seen by the [facility] and he retired. ? ? ?REVIEW OF SYSTEMS: Out of a complete 14 system review of symptoms, the patient complains only of the following symptoms,  headaches, hot flashes and all other reviewed systems are negative. ? ?ALLERGIES: ?Allergies  ?Allergen Reactions  ? Codeine Hives  ? Kiwi Extract Swelling and Other (See Comments)  ?  Tingling in throat/mouth swells.  ? Levetiracetam Other (See Comments)  ?  HALLUCINATIONS W/SUICIDAL IDEATION  ? Other   ?  NO BLOOD PRODUCTS ?  ? Powder Itching and Other (See Comments)  ?  LATEX GLOVES WITH POWDER CAUSES ITCHING  ? ? ?HOME MEDICATIONS: ?Outpatient Medications Prior to Visit  ?Medication Sig Dispense Refill  ? albuterol (PROVENTIL HFA;VENTOLIN HFA) 108 (90 BASE) MCG/ACT inhaler Inhale 1-2 puffs into the lungs every 6 (six) hours as needed for wheezing or shortness of breath.    ? albuterol (PROVENTIL) (5 MG/ML) 0.5% nebulizer solution Take 2.5 mg by nebulization every 6 (six) hours as needed for wheezing or shortness of breath.    ? aspirin-acetaminophen-caffeine (EXCEDRIN MIGRAINE) 250-250-65 MG tablet Take 2 tablets by mouth every 6 (six) hours as needed for migraine.     ? Biotin 10000 MCG TBDP Take 10,000 mcg by mouth at bedtime.    ? estradiol (ESTRACE) 1 MG tablet Take 1 tablet (1 mg total) by mouth at bedtime. 90 tablet 3  ? fluticasone-salmeterol (ADVAIR HFA) 45-21 MCG/ACT inhaler Inhale 2 puffs into the lungs 2 (two) times daily.    ? Galcanezumab-gnlm (EMGALITY) 120 MG/ML SOAJ ADMINISTER 1 ML('120MG'$ ) UNDER THE SKIN EVERY 30 DAYS please make overdue follow up visit for continued refills. 1st attempt 1 mL 0  ? mometasone-formoterol (DULERA) 200-5 MCG/ACT AERO Inhale 2 puffs  into the lungs 2 (two) times daily.    ? Ubrogepant (UBRELVY) 100 MG TABS TAKE 1 TABLET BY MOUTH EVERY 2 HOURS AS NEEDED, MAXIMUM DAILY DOSE IS 2 TABLETS 10 tablet 5  ? ?No facility-administered medications prior to visit.  ? ? ?PAST MEDICAL HISTORY: ?Past Medical History:  ?Diagnosis Date  ? Abnormal uterine bleeding   ? Allergic rhinitis   ? Anemia   ? Asthma   ? Depression   ? hx of  ? Fibroid   ? History of COVID-19 05/30/2020  ? Pos  for Delta variant  ? History of esophagogastroduodenoscopy (EGD) 2016  ? H pylori +  ? Hypercholesterolemia   ? Migraine   ? with aura  ? Patient is Jehovah's Witness   ? Vitamin D deficiency   ? ? ?PAST SURGICAL H

## 2021-12-25 NOTE — Patient Instructions (Incomplete)
Below is our plan: ? ?We will *** ? ?Please make sure you are staying well hydrated. I recommend 50-60 ounces daily. Well balanced diet and regular exercise encouraged. Consistent sleep schedule with 6-8 hours recommended.  ? ?Please continue follow up with care team as directed.  ? ?Follow up with *** in *** ? ?You may receive a survey regarding today's visit. I encourage you to leave honest feed back as I do use this information to improve patient care. Thank you for seeing me today!  ? ?Migraine with aura: There is increased risk for stroke in women with migraine with aura and a contraindication for the combined contraceptive pill for use by women who have migraine with aura. The risk for women with migraine without aura is lower. However other risk factors like smoking are far more likely to increase stroke risk than migraine. There is a recommendation for no smoking and for the use of OCPs without estrogen such as progestogen only pills particularly for women with migraine with aura.Marland Kitchen People who have migraine headaches with auras may be 3 times more likely to have a stroke caused by a blood clot, compared to migraine patients who don't see auras. Women who take hormone-replacement therapy may be 30 percent more likely to suffer a clot-based stroke than women not taking medication containing estrogen. Other risk factors like smoking and high blood pressure may be  much more important.  ? ?Magnesium: may help with headaches are aura, the best evidence for magnesium is for migraine with aura is its thought to stop the cortical spreading depression we believe is the pathophysiology of migraine aura. ? ?To prevent or relieve headaches, try the following: ?Cool Compress. Lie down and place a cool compress on your head.  ?Avoid headache triggers. If certain foods or odors seem to have triggered your migraines in the past, avoid them. A headache diary might help you identify triggers.  ?Include physical activity in  your daily routine. Try a daily walk or other moderate aerobic exercise.  ?Manage stress. Find healthy ways to cope with the stressors, such as delegating tasks on your to-do list.  ?Practice relaxation techniques. Try deep breathing, yoga, massage and visualization.  ?Eat regularly. Eating regularly scheduled meals and maintaining a healthy diet might help prevent headaches. Also, drink plenty of fluids.  ?Follow a regular sleep schedule. Sleep deprivation might contribute to headaches ?Consider biofeedback. With this mind-body technique, you learn to control certain bodily functions -- such as muscle tension, heart rate and blood pressure -- to prevent headaches or reduce headache pain.  ?  ?  ?Proceed to emergency room if you experience new or worsening symptoms or symptoms do not resolve, if you have new neurologic symptoms or if headache is severe, or for any concerning symptom.  ?

## 2021-12-26 ENCOUNTER — Ambulatory Visit: Payer: BC Managed Care – PPO | Admitting: Family Medicine

## 2021-12-26 DIAGNOSIS — G43109 Migraine with aura, not intractable, without status migrainosus: Secondary | ICD-10-CM

## 2021-12-27 ENCOUNTER — Encounter: Payer: Self-pay | Admitting: Family Medicine

## 2021-12-27 ENCOUNTER — Other Ambulatory Visit: Payer: Self-pay

## 2021-12-27 ENCOUNTER — Ambulatory Visit (INDEPENDENT_AMBULATORY_CARE_PROVIDER_SITE_OTHER): Payer: BC Managed Care – PPO | Admitting: Family Medicine

## 2021-12-27 VITALS — BP 126/75 | HR 75 | Ht 60.0 in | Wt 138.0 lb

## 2021-12-27 DIAGNOSIS — G43109 Migraine with aura, not intractable, without status migrainosus: Secondary | ICD-10-CM

## 2021-12-27 MED ORDER — QULIPTA 60 MG PO TABS: 60.0000 mg | ORAL_TABLET | Freq: Every day | ORAL | 3 refills | Status: DC

## 2021-12-27 NOTE — Progress Notes (Signed)
? ? ?PATIENT: Stephanie Hancock ?DOB: [birth year] ? ?REASON FOR VISIT: follow up ?HISTORY FROM: patient ? ?Chief Complaint  ?Patient presents with  ? Follow-up  ?  Rm 15, alone. Here for yearly migraine f/u. Migraine are about the same. Pt has read how migraines change the brain. Pt is worried how much these migraines have changed her brain.   ?  ? ?HISTORY OF PRESENT ILLNESS: ? ?12/27/21 ALL:  ?Stephanie Hancock returns for follow up for migraines. She continues [place]. She feels migraines are unchanged. She continues to have about 10 migraines per month. She knows that smells are most common trigger. Roselyn Meier helps most of the time but occasionally she has to take second dose with Excedrin. She continues to have significant anxiety with administering injections. She has to have her husband help her. She would prefer oral treatment if possible. She is not interested in Botox.  ? ?Medications tried and failed: Emgality (on now), Ajovy (rash), Amovig (laxtex allergy), Patient had hair loss with topiramate (hair loss), Zonegran (refused due to potential hair loss), Qudexy, Nortriptyline, levetiracetam (hallucinations), gabapentin, propranolol ?Abortive: Sumatriptan po, naratriptan, rizatriptan, zolmitriptan, tizanidine, Excedrin migraine, advil, steroids, diclofenac, Reglan and ketorolac injections, tramadol, zofran tablet.  ? ?11/16/2020 ALL: ?She returns for migraine follow up. She continues [place].  She reports that she is doing really well.  She is tolerating medications without obvious adverse effects.  She continues to have about 10 migraine days per month.  She is using 5-8 doses of Ubrelvy each month.  Occasionally she will use Excedrin with Ubrelvy.  She feels that migraines are very well managed at this time.  She does note that she occasionally has black dots/bugs in her peripheral vision prior to headache.  Abortive medicines help with this.  Symptoms are not new and have improved significantly  with migraine management.  Eye exam last over a year ago.  No other concerns of hallucinations or mood changes. ? ?05/16/2020 ALL: ?Stephanie Hancock is a 56 y.o. female here today for follow up for migraines. She was started on Emgality in 12/2019. She has had 4 injections. Allergies (rash, whelps) with Amovig and Ajovy. She has not needed to Benadryl. She reports migraines are improved in frequency and intensity. She reports having about 8 migraines a month. Roselyn Meier helps with abortive therapy. She uses Excedrin as well and feels she uses it more often than she should. She continues discussion of weaning Estrace with PCP.  ? ?She is planning to have a cholecystectomy on 8/31. She has 12 tablets of oxycodone for post op. She does not take this medication. She is eating differently which she feels has helped. She has eliminated fried and spicy foods. She is eating more fruits. She is drinking more water.  ? ? ?HISTORY: (copied from Dr Cathren Laine note on 12/14/2019) ? ?12/14/2019: We have not seen patient since 2018, here for migraines In the past she tried multiple medications (see below). She states since she saw [place] she has tried aimovig with a bad rash. The can be on either side, behind the eye, can last 24-72 hours and moderately severe to severe. Pulsating, pounding, throbbing, light and sound sensitivity, smells cam trigger and bother, she has a UV light, light can trigger even natural sunlight. 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 She has had them since the age of 47. Worsening in the last year. She takes excedrin but understands about medication overuse. They cn be severe. Sleep helps, excedrin helps. No  other focal neurologic deficits, associated symptoms, inciting events or modifiable factors. Ajovy had a large rash. She has a latex allergy cannot take aimovig.  ?  ?  ?Patient had hair loss with topiramate. Doesn't want to try Zonegran bc it has similar side effects. Medications tried: Sumatriptan po, zolmitriptan,  nortriptyline, tizanidine, Keppra (hallucinations and suicidal ideation), excedrin migraine, advil. Amerge, Topamx, maxalt, steroids, Topiramate (allopecia), Zonegran (doesn't want to try bc may also have allopecia), Qudexy. By review of chart additional meds tried since being seen in 2018 include: Aimovig, excedrin, ibuprofen, imitrex injections, gabapentin, diclofenac tan relpax, reglan and ketorolac injections, zomig, tramadol, propranolol, zofran tablet.  Ajovy had a large rash. She has a latex allergy cannot take aimovig.  ?  ?  ?07/24/2017: showed No acute intracranial abnormalities including mass lesion or mass effect, hydrocephalus, extra-axial fluid collection, midline shift, hemorrhage, or acute infarction, large ischemic events (personally reviewed images).  ?  ?07/2018 TSH normal ?  ?  ?PRIOR ?Addendum Patient had hair loss with topiramate. Doesn't want to try Zonegran bc it has similar side effects. Medications tried: Sumatriptan, zolmitriptan, nortriptyline, tizanidine, Keppra (hallucinations and suicidal ideation), excedrin migraine, advil. Amerge, Topamx, maxalt, steroids  ?  ?Medications tried: Sumatriptan, zolmitriptan, nortriptyline, tizanidine, Keppra (hallucinations and suicidal ideation), excedrin migraine, advil. Amerge, Topamx, maxalt, steroids, Topiramate (allopecia), Zonegran (doesn't want to try bc may also have allopecia), Qudexy.  ?  ?HPI:  Stephanie Hancock is a 56 y.o. female here as a referral from Dr. Stephanie Acre for migraines.  Past medical history of asthma, migraines, anemia, uterine fibroids, rhinitis, H. pylori, vitamin D deficiency. Mother with migraines. Daughter with migraines. She has had migraines since the age of 65. She used to get an aura, "bouncy orbs or black and white lines", no auras now. She takes excedrin excessively, +overuse. No aura. Daily headaches, she has 14 migrainous a month for years and can last all day. If she didn't treat she would have daily migraines. They  can be unilateral, pounding, throbbing, behind the eye, pain in the left temple, nausea, vomiting, light and sound sensitivity. Lights aggravate. Sitting still helps in a dark room. Weather changes with aggravate her migraines.  ?  ?Medications tried: Sumatriptan, zolmitriptan, nortriptyline, tizanidine, Keppra (hallucinations and suicidal ideation), excedrin migraine, advil. Amerge, Topamx, maxalt, steroids  ?  ?Reviewed notes, labs and imaging from outside physicians, which showed: ?  ?MRI brain 07/2017 showed No acute intracranial abnormalities including mass lesion or mass effect, hydrocephalus, extra-axial fluid collection, midline shift, hemorrhage, or acute infarction, large ischemic events (personally reviewed images) ?  ?CBC with anemia 11.2/33.5, BMP elevated glusoce. 06/2017. ?  ?Medications tried: Sumatriptan, zolmitriptan, nortriptyline, tizanidine.  Patient was being seen by the [facility] and he retired. ? ? ?REVIEW OF SYSTEMS: Out of a complete 14 system review of symptoms, the patient complains only of the following symptoms, headaches, hot flashes and all other reviewed systems are negative. ? ?ALLERGIES: ?Allergies  ?Allergen Reactions  ? Codeine Hives  ? Kiwi Extract Swelling and Other (See Comments)  ?  Tingling in throat/mouth swells.  ? Levetiracetam Other (See Comments)  ?  HALLUCINATIONS W/SUICIDAL IDEATION  ? Other   ?  NO BLOOD PRODUCTS ?  ? Powder Itching and Other (See Comments)  ?  LATEX GLOVES WITH POWDER CAUSES ITCHING  ? ? ?HOME MEDICATIONS: ?Outpatient Medications Prior to Visit  ?Medication Sig Dispense Refill  ? albuterol (PROVENTIL HFA;VENTOLIN HFA) 108 (90 BASE) MCG/ACT inhaler Inhale 1-2 puffs into the lungs  every 6 (six) hours as needed for wheezing or shortness of breath.    ? albuterol (PROVENTIL) (5 MG/ML) 0.5% nebulizer solution Take 2.5 mg by nebulization every 6 (six) hours as needed for wheezing or shortness of breath.    ? aspirin-acetaminophen-caffeine  (EXCEDRIN MIGRAINE) 250-250-65 MG tablet Take 2 tablets by mouth every 6 (six) hours as needed for migraine.     ? Biotin 10000 MCG TBDP Take 10,000 mcg by mouth at bedtime.    ? estradiol (ESTRACE) 1 MG tablet T

## 2021-12-27 NOTE — Patient Instructions (Signed)
Below is our plan: ? ?We will switch Emgality injections to [place]. You will take '60mg'$  daily. Please stay well hydrated and monitor bowel movements. Colace may be helpful if needed for stool softening, if needed. Continue Ubrelvy as needed.  ? ?Please make sure you are staying well hydrated. I recommend 50-60 ounces daily. Well balanced diet and regular exercise encouraged. Consistent sleep schedule with 6-8 hours recommended.  ? ?Please continue follow up with care team as directed.  ? ?Follow up with me in 6 months ? ?You may receive a survey regarding today's visit. I encourage you to leave honest feed back as I do use this information to improve patient care. Thank you for seeing me today!  ? ? ?

## 2021-12-28 ENCOUNTER — Telehealth: Payer: Self-pay | Admitting: *Deleted

## 2021-12-28 NOTE — Telephone Encounter (Signed)
PA [place] submitted on CMM.  Key: [id number] - Rx #: [id number]. Waiting on determination from [place].  ?

## 2022-01-01 NOTE — Telephone Encounter (Signed)
PA approved effective 12/28/2021 through 03/21/2022. ? ?

## 2022-03-02 ENCOUNTER — Telehealth: Payer: Self-pay | Admitting: Neurology

## 2022-03-02 NOTE — Telephone Encounter (Signed)
PA completed through CMM/[employer] [id number]  Will await determination

## 2022-03-06 NOTE — Telephone Encounter (Signed)
PA approved effective from 03/02/2022 through 03/02/2023.

## 2022-03-14 ENCOUNTER — Telehealth: Payer: Self-pay | Admitting: *Deleted

## 2022-03-14 NOTE — Telephone Encounter (Signed)
Submitted PA [place] on CMM. Key: [id number]. Waiting on determination from [place].

## 2022-03-19 NOTE — Telephone Encounter (Signed)
PA approved effective from 03/14/2022 through 03/14/2023.

## 2022-04-23 ENCOUNTER — Ambulatory Visit: Payer: BC Managed Care – PPO | Admitting: Family Medicine

## 2022-06-27 NOTE — Progress Notes (Deleted)
PATIENT: Stephanie Hancock DOB: 25-Jul-1966  REASON FOR VISIT: follow up HISTORY FROM: patient  No chief complaint on file.    HISTORY OF PRESENT ILLNESS:  06/27/22 ALL:  Stephanie Hancock returns for follow up for migraines. We switched Emgality to [place] at last visit 12/2021. Since,   12/27/2021 ALL: Stephanie Hancock returns for follow up for migraines. She continues [place]. She feels migraines are unchanged. She continues to have about 10 migraines per month. She knows that smells are most common trigger. Roselyn Meier helps most of the time but occasionally she has to take second dose with Excedrin. She continues to have significant anxiety with administering injections. She has to have her husband help her. She would prefer oral treatment if possible. She is not interested in Botox.   Medications tried and failed: Emgality (on now), Ajovy (rash), Amovig (laxtex allergy), Patient had hair loss with topiramate (hair loss), Zonegran (refused due to potential hair loss), Qudexy, Nortriptyline, levetiracetam (hallucinations), gabapentin, propranolol Abortive: Sumatriptan po, naratriptan, rizatriptan, zolmitriptan, tizanidine, Excedrin migraine, advil, steroids, diclofenac, Reglan and ketorolac injections, tramadol, zofran tablet.   11/16/2020 ALL: She returns for migraine follow up. She continues [place].  She reports that she is doing really well.  She is tolerating medications without obvious adverse effects.  She continues to have about 10 migraine days per month.  She is using 5-8 doses of Ubrelvy each month.  Occasionally she will use Excedrin with Ubrelvy.  She feels that migraines are very well managed at this time.  She does note that she occasionally has black dots/bugs in her peripheral vision prior to headache.  Abortive medicines help with this.  Symptoms are not new and have improved significantly with migraine management.  Eye exam last over a year ago.  No other concerns of  hallucinations or mood changes.  05/16/2020 ALL: Stephanie Hancock is a 56 y.o. female here today for follow up for migraines. She was started on Emgality in 12/2019. She has had 4 injections. Allergies (rash, whelps) with Amovig and Ajovy. She has not needed to Benadryl. She reports migraines are improved in frequency and intensity. She reports having about 8 migraines a month. Roselyn Meier helps with abortive therapy. She uses Excedrin as well and feels she uses it more often than she should. She continues discussion of weaning Estrace with PCP.   She is planning to have a cholecystectomy on 8/31. She has 12 tablets of oxycodone for post op. She does not take this medication. She is eating differently which she feels has helped. She has eliminated fried and spicy foods. She is eating more fruits. She is drinking more water.    HISTORY: (copied from Dr Cathren Laine note on 12/14/2019)  12/14/2019: We have not seen patient since 2018, here for migraines In the past she tried multiple medications (see below). She states since she saw [place] she has tried aimovig with a bad rash. The can be on either side, behind the eye, can last 24-72 hours and moderately severe to severe. Pulsating, pounding, throbbing, light and sound sensitivity, smells cam trigger and bother, she has a UV light, light can trigger even natural sunlight. 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 She has had them since the age of 89. Worsening in the last year. She takes excedrin but understands about medication overuse. They cn be severe. Sleep helps, excedrin helps. No other focal neurologic deficits, associated symptoms, inciting events or modifiable factors. Ajovy had a large rash. She has a latex  allergy cannot take aimovig.      Patient had hair loss with topiramate. Doesn't want to try Zonegran bc it has similar side effects. Medications tried: Sumatriptan po, zolmitriptan, nortriptyline, tizanidine, Keppra (hallucinations and suicidal ideation), excedrin  migraine, advil. Amerge, Topamx, maxalt, steroids, Topiramate (allopecia), Zonegran (doesn't want to try bc may also have allopecia), Qudexy. By review of chart additional meds tried since being seen in 2018 include: Aimovig, excedrin, ibuprofen, imitrex injections, gabapentin, diclofenac tan relpax, reglan and ketorolac injections, zomig, tramadol, propranolol, zofran tablet.  Ajovy had a large rash. She has a latex allergy cannot take aimovig.      07/24/2017: showed No acute intracranial abnormalities including mass lesion or mass effect, hydrocephalus, extra-axial fluid collection, midline shift, hemorrhage, or acute infarction, large ischemic events (personally reviewed images).    07/2018 TSH normal     PRIOR Addendum Patient had hair loss with topiramate. Doesn't want to try Zonegran bc it has similar side effects. Medications tried: Sumatriptan, zolmitriptan, nortriptyline, tizanidine, Keppra (hallucinations and suicidal ideation), excedrin migraine, advil. Amerge, Topamx, maxalt, steroids    Medications tried: Sumatriptan, zolmitriptan, nortriptyline, tizanidine, Keppra (hallucinations and suicidal ideation), excedrin migraine, advil. Amerge, Topamx, maxalt, steroids, Topiramate (allopecia), Zonegran (doesn't want to try bc may also have allopecia), Qudexy.    HPI:  Stephanie Hancock is a 56 y.o. female here as a referral from Dr. Stephanie Acre for migraines.  Past medical history of asthma, migraines, anemia, uterine fibroids, rhinitis, H. pylori, vitamin D deficiency. Mother with migraines. Daughter with migraines. She has had migraines since the age of 41. She used to get an aura, "bouncy orbs or black and white lines", no auras now. She takes excedrin excessively, +overuse. No aura. Daily headaches, she has 14 migrainous a month for years and can last all day. If she didn't treat she would have daily migraines. They can be unilateral, pounding, throbbing, behind the eye, pain in the left temple,  nausea, vomiting, light and sound sensitivity. Lights aggravate. Sitting still helps in a dark room. Weather changes with aggravate her migraines.    Medications tried: Sumatriptan, zolmitriptan, nortriptyline, tizanidine, Keppra (hallucinations and suicidal ideation), excedrin migraine, advil. Amerge, Topamx, maxalt, steroids    Reviewed notes, labs and imaging from outside physicians, which showed:   MRI brain 07/2017 showed No acute intracranial abnormalities including mass lesion or mass effect, hydrocephalus, extra-axial fluid collection, midline shift, hemorrhage, or acute infarction, large ischemic events (personally reviewed images)   CBC with anemia 11.2/33.5, BMP elevated glusoce. 06/2017.   Medications tried: Sumatriptan, zolmitriptan, nortriptyline, tizanidine.  Patient was being seen by the [facility] and he retired.   REVIEW OF SYSTEMS: Out of a complete 14 system review of symptoms, the patient complains only of the following symptoms, headaches, hot flashes and all other reviewed systems are negative.  ALLERGIES: Allergies  Allergen Reactions   Codeine Hives   Kiwi Extract Swelling and Other (See Comments)    Tingling in throat/mouth swells.   Levetiracetam Other (See Comments)    HALLUCINATIONS W/SUICIDAL IDEATION   Other     NO BLOOD PRODUCTS    Powder Itching and Other (See Comments)    LATEX GLOVES WITH POWDER CAUSES ITCHING    HOME MEDICATIONS: Outpatient Medications Prior to Visit  Medication Sig Dispense Refill   albuterol (PROVENTIL HFA;VENTOLIN HFA) 108 (90 BASE) MCG/ACT inhaler Inhale 1-2 puffs into the lungs every 6 (six) hours as needed for wheezing or shortness of breath.     albuterol (PROVENTIL) (5 MG/ML)  0.5% nebulizer solution Take 2.5 mg by nebulization every 6 (six) hours as needed for wheezing or shortness of breath.     aspirin-acetaminophen-caffeine (EXCEDRIN MIGRAINE) 250-250-65 MG tablet Take 2 tablets by mouth every 6 (six) hours  as needed for migraine.      Atogepant (QULIPTA) 60 MG TABS Take 60 mg by mouth daily. 90 tablet 3   Biotin 10000 MCG TBDP Take 10,000 mcg by mouth at bedtime.     estradiol (ESTRACE) 1 MG tablet Take 1 tablet (1 mg total) by mouth at bedtime. 90 tablet 3   fluticasone-salmeterol (ADVAIR HFA) 45-21 MCG/ACT inhaler Inhale 2 puffs into the lungs 2 (two) times daily.     Galcanezumab-gnlm (EMGALITY) 120 MG/ML SOAJ ADMINISTER 1 ML('120MG'$ ) UNDER THE SKIN EVERY 30 DAYS please make overdue follow up visit for continued refills. 1st attempt 1 mL 0   mometasone-formoterol (DULERA) 200-5 MCG/ACT AERO Inhale 2 puffs into the lungs 2 (two) times daily.     Ubrogepant (UBRELVY) 100 MG TABS TAKE 1 TABLET BY MOUTH EVERY 2 HOURS AS NEEDED, MAXIMUM DAILY DOSE IS 2 TABLETS 10 tablet 5   No facility-administered medications prior to visit.    PAST MEDICAL HISTORY: Past Medical History:  Diagnosis Date   Abnormal uterine bleeding    Allergic rhinitis    Anemia    Asthma    Depression    hx of   Fibroid    History of COVID-19 05/30/2020   Pos for Delta variant   History of esophagogastroduodenoscopy (EGD) 2016   H pylori +   Hypercholesterolemia    Migraine    with aura   Patient is Jehovah's Witness    Vitamin D deficiency     PAST SURGICAL HISTORY: Past Surgical History:  Procedure Laterality Date   CHOLECYSTECTOMY N/A 07/22/2020   Procedure: LAPAROSCOPIC CHOLECYSTECTOMY;  Surgeon: Georganna Skeans, MD;  Location: [facility];  Service: General;  Laterality: N/A;   ROBOTIC ASSISTED TOTAL HYSTERECTOMY Bilateral 06/11/2017   Procedure: XI ROBOTIC ASSISTED TOTAL HYSTERECTOMY BILATERAL SALPINGECTOMY, RIGHT OOPHERECTOMY,  LYSIS OF ADHESIONS ,MINI LAPAROTOMY;  Surgeon: Everitt Amber, MD;  Location: [facility];  Service: Gynecology;  Laterality: Bilateral;   TUBAL LIGATION     then reversal   UTERINE FIBROID SURGERY      FAMILY HISTORY: Family History  Problem Relation Age of Onset   Hypertension Mother     Diabetes Mother    Osteoporosis Mother    Hypertension Father    Prostate cancer Father    Heart attack Father    Kidney cancer Sister     SOCIAL HISTORY: Social History   Socioeconomic History   Marital status: Married    Spouse name: Not on file   Number of children: 2   Years of education: Not on file   Highest education level: Some college, no degree  Occupational History   Not on file  Tobacco Use   Smoking status: Never   Smokeless tobacco: Never  Vaping Use   Vaping Use: Never used  Substance and Sexual Activity   Alcohol use: Yes    Alcohol/week: 3.0 standard drinks of alcohol    Types: 3 Cans of beer per week   Drug use: No   Sexual activity: Yes    Birth control/protection: Surgical    Comment: hysterectomy  Other Topics Concern   Not on file  Social History Narrative   Lives at home with husband   Caffeine: 1 cup coffee every other day   Right  handed predominantly, uses both hands      Social Determinants of Health   Financial Resource Strain: Not on file  Food Insecurity: Not on file  Transportation Needs: Not on file  Physical Activity: Not on file  Stress: Not on file  Social Connections: Not on file  Intimate Partner Violence: Not on file      PHYSICAL EXAM  There were no vitals filed for this visit.   There is no height or weight on file to calculate BMI.  Generalized: Well developed, in no acute distress  Cardiology: normal rate and rhythm, no murmur noted Respiratory: clear to auscultation bilaterally  Neurological examination  Mentation: Alert oriented to time, place, history taking. Follows all commands speech and language fluent Cranial nerve II-XII: Pupils were equal round reactive to light. Extraocular movements were full, visual field were full  Motor: The motor testing reveals 5 over 5 strength of all 4 extremities. Good symmetric motor tone is noted throughout.  Gait and station: Gait is normal.    DIAGNOSTIC DATA (LABS,  IMAGING, TESTING) - I reviewed patient records, labs, notes, testing and imaging myself where available.      No data to display           Lab Results  Component Value Date   WBC 4.8 07/15/2020   HGB 12.6 07/15/2020   HCT 38.5 07/15/2020   MCV 91.9 07/15/2020   PLT 267 07/15/2020      Component Value Date/Time   NA 140 07/15/2020 0955   NA 140 04/04/2017 1636   K 3.9 07/15/2020 0955   CL 106 07/15/2020 0955   CO2 25 07/15/2020 0955   GLUCOSE 97 07/15/2020 0955   BUN 11 07/15/2020 0955   BUN 14 04/04/2017 1636   CREATININE 0.76 07/15/2020 0955   CALCIUM 9.5 07/15/2020 0955   PROT 7.2 06/02/2020 0929   ALBUMIN 4.0 06/02/2020 0929   AST 22 06/02/2020 0929   ALT 28 06/02/2020 0929   ALKPHOS 72 06/02/2020 0929   BILITOT 0.3 06/02/2020 0929   GFRNONAA >60 07/15/2020 0955   GFRAA >60 06/02/2020 0929   No results found for: "CHOL", "HDL", "LDLCALC", "LDLDIRECT", "TRIG", "CHOLHDL" No results found for: "HGBA1C" No results found for: "VITAMINB12" Lab Results  Component Value Date   TSH 2.520 07/23/2018       ASSESSMENT AND PLAN 56 y.o. year old female  has a past medical history of Abnormal uterine bleeding, Allergic rhinitis, Anemia, Asthma, Depression, Fibroid, History of COVID-19 (05/30/2020), History of esophagogastroduodenoscopy (EGD) (2016), Hypercholesterolemia, Migraine, Patient is Jehovah's Witness, and Vitamin D deficiency. here with   No diagnosis found.     Parul continues to have about 10 migrainous headaches a month. She is not certain Emgality if effective and has needle anxiety/fatigue. We will switch her to [place] '60mg'$  daily. We will continue Ubrelvy as needed.  She may use Excedrin sparingly.  Side effects reviewed. She will continue close follow-up with primary care.  Healthy lifestyle habits encouraged.  She will follow-up with [place] in 6 months, sooner if needed.  She verbalizes understanding and agreement with this plan.   No orders of the  defined types were placed in this encounter.    No orders of the defined types were placed in this encounter.       Debbora Presto, FNP-C 06/27/2022, 9:33 AM [facility] [address], [place], [place] [address] [phone]

## 2022-07-02 ENCOUNTER — Ambulatory Visit: Payer: BC Managed Care – PPO | Admitting: Family Medicine

## 2022-07-02 DIAGNOSIS — G43109 Migraine with aura, not intractable, without status migrainosus: Secondary | ICD-10-CM

## 2022-07-20 DIAGNOSIS — Z1231 Encounter for screening mammogram for malignant neoplasm of breast: Secondary | ICD-10-CM | POA: Diagnosis not present

## 2022-07-23 ENCOUNTER — Encounter: Payer: Self-pay | Admitting: Obstetrics and Gynecology

## 2022-08-23 ENCOUNTER — Other Ambulatory Visit: Payer: Self-pay

## 2022-08-23 MED ORDER — UBRELVY 100 MG PO TABS
ORAL_TABLET | ORAL | 5 refills | Status: DC
Start: 2022-08-23 — End: 2023-03-27

## 2022-08-27 ENCOUNTER — Telehealth: Payer: Self-pay | Admitting: *Deleted

## 2022-08-27 NOTE — Telephone Encounter (Signed)
Received fax that PA approved 03/02/22-03/02/23. Ref # [id number]

## 2022-08-27 NOTE — Telephone Encounter (Signed)
Submitted PA Ubrelvy on [facility]. Key: [id number]. Waiting on determination from [place].

## 2022-09-22 ENCOUNTER — Other Ambulatory Visit: Payer: Self-pay | Admitting: Obstetrics and Gynecology

## 2022-09-24 NOTE — Telephone Encounter (Signed)
Medication refill request: estradiol  Last AEX:  09/13/21 Next AEX: 10/23/22 Last MMG (if hormonal medication request): 07/23/22 Bi-rads 1 neg  Refill authorized: #30 with 0 rf to get patient to her aex

## 2022-09-25 ENCOUNTER — Other Ambulatory Visit: Payer: Self-pay | Admitting: Obstetrics and Gynecology

## 2022-09-25 NOTE — Telephone Encounter (Signed)
Last AEX 09/13/2021--scheduled for 10/23/2022 Last mammo 10/23/2022- neg birads 1   Rx sent for #30 w/ 0 refills yesterday. Rx request comment states pt requesting 90 day supply. Please advise.

## 2022-09-26 ENCOUNTER — Other Ambulatory Visit: Payer: Self-pay | Admitting: Obstetrics and Gynecology

## 2022-09-26 NOTE — Telephone Encounter (Signed)
Last AEX 09/13/2021--scheduled for 10/23/2022 Last mammo 07/20/2022- birads 1 neg  **Pt requesting 90 day supply refill.  Please advise.

## 2022-10-11 NOTE — Progress Notes (Deleted)
57 y.o. [id number] Married [place] American female here for annual exam.    PCP:     Patient's last menstrual period was 03/29/2017.           Sexually active: {yes no:314532}  The current method of family planning is status post hysterectomy.    Exercising: {yes no:314532}  {types:19826} Smoker:  {YES [id number]  Health Maintenance: Pap:  02/2016 normal per patient, 09-13-15 Neg:Neg HR HPV  History of abnormal Pap:  no MMG:  07/23/22 Breast Density Category C, BI-RADS CATEGORY 1 Negative Colonoscopy:  06/22/15 BMD:   n/a  Result  n/a TDaP:  PCP Gardasil:   no HIV: neg in preg Hep C: 04/13/20 Neg Screening Labs:  Hb today: ***, Urine today: ***   reports that she has never smoked. She has never used smokeless tobacco. She reports current alcohol use of about 3.0 standard drinks of alcohol per week. She reports that she does not use drugs.  Past Medical History:  Diagnosis Date   Abnormal uterine bleeding    Allergic rhinitis    Anemia    Asthma    Depression    hx of   Fibroid    History of COVID-19 05/30/2020   Pos for Delta variant   History of esophagogastroduodenoscopy (EGD) 2016   H pylori +   Hypercholesterolemia    Migraine    with aura   Patient is Jehovah's Witness    Vitamin D deficiency     Past Surgical History:  Procedure Laterality Date   CHOLECYSTECTOMY N/A 07/22/2020   Procedure: LAPAROSCOPIC CHOLECYSTECTOMY;  Surgeon: Georganna Skeans, MD;  Location: [place];  Service: General;  Laterality: N/A;   ROBOTIC ASSISTED TOTAL HYSTERECTOMY Bilateral 06/11/2017   Procedure: XI ROBOTIC ASSISTED TOTAL HYSTERECTOMY BILATERAL SALPINGECTOMY, RIGHT OOPHERECTOMY,  LYSIS OF ADHESIONS ,MINI LAPAROTOMY;  Surgeon: Everitt Amber, MD;  Location: [facility];  Service: Gynecology;  Laterality: Bilateral;   TUBAL LIGATION     then reversal   UTERINE FIBROID SURGERY      Current Outpatient Medications  Medication Sig Dispense Refill   albuterol (PROVENTIL HFA;VENTOLIN HFA) 108 (90  BASE) MCG/ACT inhaler Inhale 1-2 puffs into the lungs every 6 (six) hours as needed for wheezing or shortness of breath.     albuterol (PROVENTIL) (5 MG/ML) 0.5% nebulizer solution Take 2.5 mg by nebulization every 6 (six) hours as needed for wheezing or shortness of breath.     aspirin-acetaminophen-caffeine (EXCEDRIN MIGRAINE) 250-250-65 MG tablet Take 2 tablets by mouth every 6 (six) hours as needed for migraine.      Atogepant (QULIPTA) 60 MG TABS Take 60 mg by mouth daily. 90 tablet 3   Biotin 10000 MCG TBDP Take 10,000 mcg by mouth at bedtime.     estradiol (ESTRACE) 1 MG tablet TAKE 1 TABLET(1 MG) BY MOUTH AT BEDTIME 90 tablet 0   fluticasone-salmeterol (ADVAIR HFA) 45-21 MCG/ACT inhaler Inhale 2 puffs into the lungs 2 (two) times daily.     Galcanezumab-gnlm (EMGALITY) 120 MG/ML SOAJ ADMINISTER 1 ML(120MG) UNDER THE SKIN EVERY 30 DAYS please make overdue follow up visit for continued refills. 1st attempt 1 mL 0   mometasone-formoterol (DULERA) 200-5 MCG/ACT AERO Inhale 2 puffs into the lungs 2 (two) times daily.     Ubrogepant (UBRELVY) 100 MG TABS TAKE 1 TABLET BY MOUTH EVERY 2 HOURS AS NEEDED, MAXIMUM DAILY DOSE IS 2 TABLETS 10 tablet 5   No current facility-administered medications for this visit.    Family History  Problem Relation  Age of Onset   Hypertension Mother    Diabetes Mother    Osteoporosis Mother    Hypertension Father    Prostate cancer Father    Heart attack Father    Kidney cancer Sister     Review of Systems  Exam:   LMP 03/29/2017     General appearance: alert, cooperative and appears stated age Head: normocephalic, without obvious abnormality, atraumatic Neck: no adenopathy, supple, symmetrical, trachea midline and thyroid normal to inspection and palpation Lungs: clear to auscultation bilaterally Breasts: normal appearance, no masses or tenderness, No nipple retraction or dimpling, No nipple discharge or bleeding, No axillary adenopathy Heart: regular  rate and rhythm Abdomen: soft, non-tender; no masses, no organomegaly Extremities: extremities normal, atraumatic, no cyanosis or edema Skin: skin color, texture, turgor normal. No rashes or lesions Lymph nodes: cervical, supraclavicular, and axillary nodes normal. Neurologic: grossly normal  Pelvic: External genitalia:  no lesions              No abnormal inguinal nodes palpated.              Urethra:  normal appearing urethra with no masses, tenderness or lesions              Bartholins and Skenes: normal                 Vagina: normal appearing vagina with normal color and discharge, no lesions              Cervix: no lesions              Pap taken: {yes no:314532} Bimanual Exam:  Uterus:  normal size, contour, position, consistency, mobility, non-tender              Adnexa: no mass, fullness, tenderness              Rectal exam: {yes no:314532}.  Confirms.              Anus:  normal sphincter tone, no lesions  Chaperone was present for exam:  ***  Assessment:   Well woman visit with gynecologic exam.   Plan: Mammogram screening discussed. Self breast awareness reviewed. Pap and HR HPV as above. Guidelines for Calcium, Vitamin D, regular exercise program including cardiovascular and weight bearing exercise.   Follow up annually and prn.   Additional counseling given.  {yes [id number]. _______ minutes face to face time of which over 50% was spent in counseling.    After visit summary provided.

## 2022-10-23 ENCOUNTER — Ambulatory Visit: Payer: BC Managed Care – PPO | Admitting: Obstetrics and Gynecology

## 2022-11-15 NOTE — Progress Notes (Signed)
57 y.o. [id number] Married [place] American female here for breast and pelvic exam.    She is followed for estrogen therapy.  Hot flashes are controlled.  She has a lump in her left axilla. Had dx mammogram and axillary [place] in 2017, which showed normal subcutaneous tissue.  Not sleeping through the night.  Sleeps better if she does exercise.  Has an occasional heart flutter.  Her PCP has recommended doing potential heart monitoring.  She has not done this to date.   Husband lost his job.   Patient is on disability due to migraines.   PCP:   Dr. Stephanie Acre  Patient's last menstrual period was 03/29/2017.           Sexually active: Yes.    The current method of family planning is status post hysterectomy.    Exercising: Yes.     3 days a week Smoker:  no  Health Maintenance: Pap:  02/2016 normal per patient, 09-13-15 Neg:Neg HR HPV  History of abnormal Pap:  no MMG:  07/23/22 Breast Density Category C, BI-RADS CATEGORY 1 Neg Colonoscopy:  06/22/15 - due in 2026.  BMD:   n/a  Result  n/a TDaP:  PCP Gardasil:  no HIV: neg in preg Hep C: 04/13/20 neg Screening Labs:  PCP   reports that she has never smoked. She has never used smokeless tobacco. She reports current alcohol use of about 3.0 standard drinks of alcohol per week. She reports that she does not use drugs.  Past Medical History:  Diagnosis Date   Abnormal uterine bleeding    Allergic rhinitis    Anemia    Asthma    Depression    hx of   Fibroid    History of COVID-19 05/30/2020   Pos for Delta variant   History of esophagogastroduodenoscopy (EGD) 2016   H pylori +   Hypercholesterolemia    Migraine    with aura   Patient is Jehovah's Witness    Vitamin D deficiency     Past Surgical History:  Procedure Laterality Date   CHOLECYSTECTOMY N/A 07/22/2020   Procedure: LAPAROSCOPIC CHOLECYSTECTOMY;  Surgeon: Georganna Skeans, MD;  Location: [place];  Service: General;  Laterality: N/A;   ROBOTIC ASSISTED TOTAL  HYSTERECTOMY Bilateral 06/11/2017   Procedure: XI ROBOTIC ASSISTED TOTAL HYSTERECTOMY BILATERAL SALPINGECTOMY, RIGHT OOPHERECTOMY,  LYSIS OF ADHESIONS ,MINI LAPAROTOMY;  Surgeon: Everitt Amber, MD;  Location: [facility];  Service: Gynecology;  Laterality: Bilateral;   TUBAL LIGATION     then reversal   UTERINE FIBROID SURGERY      Current Outpatient Medications  Medication Sig Dispense Refill   albuterol (PROVENTIL HFA;VENTOLIN HFA) 108 (90 BASE) MCG/ACT inhaler Inhale 1-2 puffs into the lungs every 6 (six) hours as needed for wheezing or shortness of breath.     albuterol (PROVENTIL) (5 MG/ML) 0.5% nebulizer solution Take 2.5 mg by nebulization every 6 (six) hours as needed for wheezing or shortness of breath.     aspirin-acetaminophen-caffeine (EXCEDRIN MIGRAINE) 250-250-65 MG tablet Take 2 tablets by mouth every 6 (six) hours as needed for migraine.      Atogepant (QULIPTA) 60 MG TABS Take 60 mg by mouth daily. 90 tablet 3   Biotin 10000 MCG TBDP Take 10,000 mcg by mouth at bedtime.     estradiol (ESTRACE) 1 MG tablet TAKE 1 TABLET(1 MG) BY MOUTH AT BEDTIME 90 tablet 0   fluticasone-salmeterol (ADVAIR HFA) 45-21 MCG/ACT inhaler Inhale 2 puffs into the lungs 2 (two) times daily.  mometasone-formoterol (DULERA) 200-5 MCG/ACT AERO Inhale 2 puffs into the lungs 2 (two) times daily.     Ubrogepant (UBRELVY) 100 MG TABS TAKE 1 TABLET BY MOUTH EVERY 2 HOURS AS NEEDED, MAXIMUM DAILY DOSE IS 2 TABLETS 10 tablet 5   No current facility-administered medications for this visit.    Family History  Problem Relation Age of Onset   Hypertension Mother    Diabetes Mother    Osteoporosis Mother    Hypertension Father    Prostate cancer Father    Heart attack Father    Kidney cancer Sister     Review of Systems  All other systems reviewed and are negative.   Exam:   BP 120/78 (BP Location: Right Arm, Patient Position: Sitting, Cuff Size: Normal)   Pulse 67   Ht 5' 1"$  (1.549 m)   Wt 134 lb (60.8  kg)   LMP 03/29/2017   SpO2 97%   BMI 25.32 kg/m     General appearance: alert, cooperative and appears stated age Head: normocephalic, without obvious abnormality, atraumatic Neck: no adenopathy, supple, symmetrical, trachea midline and thyroid normal to inspection and palpation Lungs: clear to auscultation bilaterally Breasts: normal appearance, no masses or tenderness, No nipple retraction or dimpling, No nipple discharge or bleeding, No axillary adenopathy Heart: regular rate and rhythm Abdomen: soft, non-tender; no masses, no organomegaly Extremities: extremities normal, atraumatic, no cyanosis or edema Skin: skin color, texture, turgor normal. No rashes or lesions Lymph nodes: cervical, supraclavicular, and axillary nodes normal. Neurologic: grossly normal  Pelvic: External genitalia:  no lesions              No abnormal inguinal nodes palpated.              Urethra:  normal appearing urethra with no masses, tenderness or lesions              Bartholins and Skenes: normal                 Vagina: normal appearing vagina with normal color and discharge, no lesions              Cervix: absent.              Pap taken: no Bimanual Exam:  Uterus:  absent              Adnexa: no mass, fullness, tenderness              Rectal exam: yes.  Confirms.              Anus:  normal sphincter tone, no lesions  Chaperone was present for exam:  Raquel Sarna  Assessment:   Well woman visit with gynecologic exam. Status post robotic hysterectomy, bilateral salpingectomy, right oophorectomy 06/11/17.  ERT since 2019. Encounter for medication monitoring. Migraine with aura.  Asthma.   Plan: Mammogram screening discussed. Self breast awareness reviewed. Pap and HR HPV not indicated. Guidelines for Calcium, Vitamin D, regular exercise program including cardiovascular and weight bearing exercise. Refill of Estradiol 1 mg daily for one year.  We discussed potential risks of stroke, DVT, PE with  estrogen therapy.    Follow up annually and prn.   20 min  total time was spent for this patient encounter, including preparation, face-to-face counseling with the patient, coordination of care, and documentation of the encounter in addition to doing breast and pelvic exam.   After visit summary provided.    '

## 2022-11-28 ENCOUNTER — Ambulatory Visit (INDEPENDENT_AMBULATORY_CARE_PROVIDER_SITE_OTHER): Payer: No Typology Code available for payment source | Admitting: Obstetrics and Gynecology

## 2022-11-28 ENCOUNTER — Encounter: Payer: Self-pay | Admitting: Obstetrics and Gynecology

## 2022-11-28 VITALS — BP 120/78 | HR 67 | Ht 61.0 in | Wt 134.0 lb

## 2022-11-28 DIAGNOSIS — Z01419 Encounter for gynecological examination (general) (routine) without abnormal findings: Secondary | ICD-10-CM

## 2022-11-28 DIAGNOSIS — Z5181 Encounter for therapeutic drug level monitoring: Secondary | ICD-10-CM

## 2022-11-28 DIAGNOSIS — Z79899 Other long term (current) drug therapy: Secondary | ICD-10-CM

## 2022-11-28 MED ORDER — ESTRADIOL 1 MG PO TABS: ORAL_TABLET | ORAL | 3 refills | Status: DC

## 2022-11-28 NOTE — Patient Instructions (Signed)

## 2023-02-18 ENCOUNTER — Telehealth (HOSPITAL_COMMUNITY): Payer: Self-pay | Admitting: Pharmacy Technician

## 2023-02-18 NOTE — Telephone Encounter (Signed)
Patient Advocate Encounter   Received notification that prior authorization for Ubrelvy 100MG  tablets is required.   PA submitted on 02/18/2023 [employer] [facility] Commercial Electronic Request Form Status is pending

## 2023-02-19 ENCOUNTER — Other Ambulatory Visit (HOSPITAL_COMMUNITY): Payer: Self-pay

## 2023-02-19 NOTE — Telephone Encounter (Signed)
Pharmacy Patient Advocate Encounter  Prior Authorization for Ubrelvy 100MG  tablets has been approved by [employer] (ins).    PA # PA Case ID: [id number] Effective dates: 02/18/2023 through 02/18/2024  Copay is $0 per test claim.

## 2023-03-02 ENCOUNTER — Other Ambulatory Visit (HOSPITAL_COMMUNITY): Payer: Self-pay

## 2023-03-02 ENCOUNTER — Telehealth: Payer: Self-pay

## 2023-03-02 NOTE — Telephone Encounter (Signed)
Pharmacy Patient Advocate Encounter   Received notification from [facility] that prior authorization for Qulipta 60MG  tablets is required/requested.   PA submitted on 03/02/2023 to (ins) [employer] via CoverMyMeds Key or (Medicaid) confirmation # [id number] Status is pending

## 2023-03-02 NOTE — Telephone Encounter (Signed)
   PT has not been seen since the medication was prescribed and I could not find documentation stating how the medication is helping the PT. Please advise.

## 2023-03-05 NOTE — Telephone Encounter (Signed)
Phone room: pt last seen 12/27/21. She will need updated visit before we can get her [place] re-authorized through insurance. We need updated chart notes. Please schedule f/u

## 2023-03-06 NOTE — Progress Notes (Unsigned)
PATIENT: Stephanie Hancock DOB: 1966-03-21  REASON FOR VISIT: follow up HISTORY FROM: patient  No chief complaint on file.    HISTORY OF PRESENT ILLNESS:  03/06/23 ALL:  Stephanie Hancock returns for follow up for migraines. She was last seen 12/2021 and we switched Emgality to [place] and continued [place] as needed. Since,   12/27/2021 ALL: Stephanie Hancock returns for follow up for migraines. She continues [place]. She feels migraines are unchanged. She continues to have about 10 migraines per month. She knows that smells are most common trigger. Stephanie Hancock helps most of the time but occasionally she has to take second dose with Excedrin. She continues to have significant anxiety with administering injections. She has to have her husband help her. She would prefer oral treatment if possible. She is not interested in Botox.   Medications tried and failed: Emgality (on now), Ajovy (rash), Amovig (laxtex allergy), Patient had hair loss with topiramate (hair loss), Zonegran (refused due to potential hair loss), Qudexy, Nortriptyline, levetiracetam (hallucinations), gabapentin, propranolol Abortive: Sumatriptan po, naratriptan, rizatriptan, zolmitriptan, tizanidine, Excedrin migraine, advil, steroids, diclofenac, Reglan and ketorolac injections, tramadol, zofran tablet.   11/16/2020 ALL: She returns for migraine follow up. She continues [place].  She reports that she is doing really well.  She is tolerating medications without obvious adverse effects.  She continues to have about 10 migraine days per month.  She is using 5-8 doses of Ubrelvy each month.  Occasionally she will use Excedrin with Ubrelvy.  She feels that migraines are very well managed at this time.  She does note that she occasionally has black dots/bugs in her peripheral vision prior to headache.  Abortive medicines help with this.  Symptoms are not new and have improved significantly with migraine management.  Eye exam last over  a year ago.  No other concerns of hallucinations or mood changes.  05/16/2020 ALL: Stephanie Hancock is a 57 y.o. female here today for follow up for migraines. She was started on Emgality in 12/2019. She has had 4 injections. Allergies (rash, whelps) with Amovig and Ajovy. She has not needed to Benadryl. She reports migraines are improved in frequency and intensity. She reports having about 8 migraines a month. Stephanie Hancock helps with abortive therapy. She uses Excedrin as well and feels she uses it more often than she should. She continues discussion of weaning Estrace with PCP.   She is planning to have a cholecystectomy on 8/31. She has 12 tablets of oxycodone for post op. She does not take this medication. She is eating differently which she feels has helped. She has eliminated fried and spicy foods. She is eating more fruits. She is drinking more water.    HISTORY: (copied from Stephanie Hancock note on 12/14/2019)  12/14/2019: We have not seen patient since 2018, here for migraines In the past she tried multiple medications (see below). She states since she saw [place] she has tried aimovig with a bad rash. The can be on either side, behind the eye, can last 24-72 hours and moderately severe to severe. Pulsating, pounding, throbbing, light and sound sensitivity, smells cam trigger and bother, she has a UV light, light can trigger even natural sunlight. 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 She has had them since the age of 1. Worsening in the last year. She takes excedrin but understands about medication overuse. They cn be severe. Sleep helps, excedrin helps. No other focal neurologic deficits, associated symptoms, inciting events or modifiable factors. Ajovy had  a large rash. She has a latex allergy cannot take aimovig.      Patient had hair loss with topiramate. Doesn't want to try Zonegran bc it has similar side effects. Medications tried: Sumatriptan po, zolmitriptan, nortriptyline, tizanidine, Keppra (hallucinations and  suicidal ideation), excedrin migraine, advil. Amerge, Topamx, maxalt, steroids, Topiramate (allopecia), Zonegran (doesn't want to try bc may also have allopecia), Qudexy. By review of chart additional meds tried since being seen in 2018 include: Aimovig, excedrin, ibuprofen, imitrex injections, gabapentin, diclofenac tan relpax, reglan and ketorolac injections, zomig, tramadol, propranolol, zofran tablet.  Ajovy had a large rash. She has a latex allergy cannot take aimovig.      07/24/2017: showed No acute intracranial abnormalities including mass lesion or mass effect, hydrocephalus, extra-axial fluid collection, midline shift, hemorrhage, or acute infarction, large ischemic events (personally reviewed images).    07/2018 TSH normal     PRIOR Addendum Patient had hair loss with topiramate. Doesn't want to try Zonegran bc it has similar side effects. Medications tried: Sumatriptan, zolmitriptan, nortriptyline, tizanidine, Keppra (hallucinations and suicidal ideation), excedrin migraine, advil. Amerge, Topamx, maxalt, steroids    Medications tried: Sumatriptan, zolmitriptan, nortriptyline, tizanidine, Keppra (hallucinations and suicidal ideation), excedrin migraine, advil. Amerge, Topamx, maxalt, steroids, Topiramate (allopecia), Zonegran (doesn't want to try bc may also have allopecia), Qudexy.    HPI:  Stephanie Hancock is a 57 y.o. female here as a referral from Stephanie Hancock for migraines.  Past medical history of asthma, migraines, anemia, uterine fibroids, rhinitis, H. pylori, vitamin D deficiency. Mother with migraines. Daughter with migraines. She has had migraines since the age of 36. She used to get an aura, "bouncy orbs or black and white lines", no auras now. She takes excedrin excessively, +overuse. No aura. Daily headaches, she has 14 migrainous a month for years and can last all day. If she didn't treat she would have daily migraines. They can be unilateral, pounding, throbbing, behind the eye,  pain in the left temple, nausea, vomiting, light and sound sensitivity. Lights aggravate. Sitting still helps in a dark room. Weather changes with aggravate her migraines.    Medications tried: Sumatriptan, zolmitriptan, nortriptyline, tizanidine, Keppra (hallucinations and suicidal ideation), excedrin migraine, advil. Amerge, Topamx, maxalt, steroids    Reviewed notes, labs and imaging from outside physicians, which showed:   MRI brain 07/2017 showed No acute intracranial abnormalities including mass lesion or mass effect, hydrocephalus, extra-axial fluid collection, midline shift, hemorrhage, or acute infarction, large ischemic events (personally reviewed images)   CBC with anemia 11.2/33.5, BMP elevated glusoce. 06/2017.   Medications tried: Sumatriptan, zolmitriptan, nortriptyline, tizanidine.  Patient was being seen by the [facility] and he retired.   REVIEW OF SYSTEMS: Out of a complete 14 system review of symptoms, the patient complains only of the following symptoms, headaches, hot flashes and all other reviewed systems are negative.  ALLERGIES: Allergies  Allergen Reactions   Codeine Hives   Kiwi Extract Swelling and Other (See Comments)    Tingling in throat/mouth swells.   Levetiracetam Other (See Comments)    HALLUCINATIONS W/SUICIDAL IDEATION   Other     NO BLOOD PRODUCTS    Powder Itching and Other (See Comments)    LATEX GLOVES WITH POWDER CAUSES ITCHING    HOME MEDICATIONS: Outpatient Medications Prior to Visit  Medication Sig Dispense Refill   albuterol (PROVENTIL HFA;VENTOLIN HFA) 108 (90 BASE) MCG/ACT inhaler Inhale 1-2 puffs into the lungs every 6 (six) hours as needed for wheezing or shortness of breath.  albuterol (PROVENTIL) (5 MG/ML) 0.5% nebulizer solution Take 2.5 mg by nebulization every 6 (six) hours as needed for wheezing or shortness of breath.     aspirin-acetaminophen-caffeine (EXCEDRIN MIGRAINE) 250-250-65 MG tablet Take 2 tablets by  mouth every 6 (six) hours as needed for migraine.      Atogepant (QULIPTA) 60 MG TABS Take 60 mg by mouth daily. 90 tablet 3   Biotin [address] MCG TBDP Take 10,000 mcg by mouth at bedtime.     estradiol (ESTRACE) 1 MG tablet TAKE 1 TABLET(1 MG) BY MOUTH AT BEDTIME 90 tablet 3   fluticasone-salmeterol (ADVAIR HFA) 45-21 MCG/ACT inhaler Inhale 2 puffs into the lungs 2 (two) times daily.     mometasone-formoterol (DULERA) 200-5 MCG/ACT AERO Inhale 2 puffs into the lungs 2 (two) times daily.     Ubrogepant (UBRELVY) 100 MG TABS TAKE 1 TABLET BY MOUTH EVERY 2 HOURS AS NEEDED, MAXIMUM DAILY DOSE IS 2 TABLETS 10 tablet 5   No facility-administered medications prior to visit.    PAST MEDICAL HISTORY: Past Medical History:  Diagnosis Date   Abnormal uterine bleeding    Allergic rhinitis    Anemia    Asthma    Depression    hx of   Fibroid    History of COVID-19 05/30/2020   Pos for Delta variant   History of esophagogastroduodenoscopy (EGD) 2016   H pylori +   Hypercholesterolemia    Migraine    with aura   Patient is Jehovah's Witness    Vitamin D deficiency     PAST SURGICAL HISTORY: Past Surgical History:  Procedure Laterality Date   CHOLECYSTECTOMY N/A 07/22/2020   Procedure: LAPAROSCOPIC CHOLECYSTECTOMY;  Surgeon: Violeta Gelinas, MD;  Location: [facility] OR;  Service: General;  Laterality: N/A;   ROBOTIC ASSISTED TOTAL HYSTERECTOMY Bilateral 06/11/2017   Procedure: XI ROBOTIC ASSISTED TOTAL HYSTERECTOMY BILATERAL SALPINGECTOMY, RIGHT OOPHERECTOMY,  LYSIS OF ADHESIONS ,MINI LAPAROTOMY;  Surgeon: Adolphus Birchwood, MD;  Location: [facility] ORS;  Service: Gynecology;  Laterality: Bilateral;   TUBAL LIGATION     then reversal   UTERINE FIBROID SURGERY      FAMILY HISTORY: Family History  Problem Relation Age of Onset   Hypertension Mother    Diabetes Mother    Osteoporosis Mother    Hypertension Father    Prostate cancer Father    Heart attack Father    Kidney cancer Sister     SOCIAL  HISTORY: Social History   Socioeconomic History   Marital status: Married    Spouse name: Not on file   Number of children: 2   Years of education: Not on file   Highest education level: Some college, no degree  Occupational History   Not on file  Tobacco Use   Smoking status: Never   Smokeless tobacco: Never  Vaping Use   Vaping Use: Never used  Substance and Sexual Activity   Alcohol use: Yes    Alcohol/week: 3.0 standard drinks of alcohol    Types: 3 Cans of beer per week   Drug use: No   Sexual activity: Yes    Birth control/protection: Surgical    Comment: <5 sexual partners, >52 y/o, no abnormal pap  Other Topics Concern   Not on file  Social History Narrative   Lives at home with husband   Caffeine: 1 cup coffee every other day   Right handed predominantly, uses both hands      Social Determinants of Health   Financial Resource Strain: Not on  file  Food Insecurity: Not on file  Transportation Needs: Not on file  Physical Activity: Not on file  Stress: Not on file  Social Connections: Not on file  Intimate Partner Violence: Not on file      PHYSICAL EXAM  There were no vitals filed for this visit.   There is no height or weight on file to calculate BMI.  Generalized: Well developed, in no acute distress  Cardiology: normal rate and rhythm, no murmur noted Respiratory: clear to auscultation bilaterally  Neurological examination  Mentation: Alert oriented to time, place, history taking. Follows all commands speech and language fluent Cranial nerve II-XII: Pupils were equal round reactive to light. Extraocular movements were full, visual field were full  Motor: The motor testing reveals 5 over 5 strength of all 4 extremities. Good symmetric motor tone is noted throughout.  Gait and station: Gait is normal.    DIAGNOSTIC DATA (LABS, IMAGING, TESTING) - I reviewed patient records, labs, notes, testing and imaging myself where available.      No data  to display           Lab Results  Component Value Date   WBC 4.8 07/15/2020   HGB 12.6 07/15/2020   HCT 38.5 07/15/2020   MCV 91.9 07/15/2020   PLT 267 07/15/2020      Component Value Date/Time   NA 140 07/15/2020 0955   NA 140 04/04/2017 1636   K 3.9 07/15/2020 0955   CL 106 07/15/2020 0955   CO2 25 07/15/2020 0955   GLUCOSE 97 07/15/2020 0955   BUN 11 07/15/2020 0955   BUN 14 04/04/2017 1636   CREATININE 0.76 07/15/2020 0955   CALCIUM 9.5 07/15/2020 0955   PROT 7.2 06/02/2020 0929   ALBUMIN 4.0 06/02/2020 0929   AST 22 06/02/2020 0929   ALT 28 06/02/2020 0929   ALKPHOS 72 06/02/2020 0929   BILITOT 0.3 06/02/2020 0929   GFRNONAA >60 07/15/2020 0955   GFRAA >60 06/02/2020 0929   No results found for: "CHOL", "HDL", "LDLCALC", "LDLDIRECT", "TRIG", "CHOLHDL" No results found for: "HGBA1C" No results found for: "VITAMINB12" Lab Results  Component Value Date   TSH 2.520 07/23/2018       ASSESSMENT AND PLAN 57 y.o. year old female  has a past medical history of Abnormal uterine bleeding, Allergic rhinitis, Anemia, Asthma, Depression, Fibroid, History of COVID-19 (05/30/2020), History of esophagogastroduodenoscopy (EGD) (2016), Hypercholesterolemia, Migraine, Patient is Jehovah's Witness, and Vitamin D deficiency. here with   No diagnosis found.     [place] continues to have about 10 migrainous headaches a month. She is not certain Emgality if effective and has needle anxiety/fatigue. We will switch her to [place] 60mg  daily. We will continue Ubrelvy as needed.  She may use Excedrin sparingly.  Side effects reviewed. She will continue close follow-up with primary care.  Healthy lifestyle habits encouraged.  She will follow-up with [place] in 6 months, sooner if needed.  She verbalizes understanding and agreement with this plan.   No orders of the defined types were placed in this encounter.    No orders of the defined types were placed in this encounter.       Christena Deem 03/06/2023, 4:37 PM [facility] [address] [place], [place] [address] [phone]

## 2023-03-06 NOTE — Telephone Encounter (Signed)
Called pt. Informed her of message that nurse [facility] sent. I scheduled pt with Amy on 5/30 @ 3 pm. Pt said thank you so much for the call.

## 2023-03-06 NOTE — Patient Instructions (Signed)

## 2023-03-06 NOTE — Telephone Encounter (Signed)
Noted  

## 2023-03-07 ENCOUNTER — Telehealth: Payer: Self-pay

## 2023-03-07 ENCOUNTER — Encounter: Payer: Self-pay | Admitting: Family Medicine

## 2023-03-07 ENCOUNTER — Ambulatory Visit (INDEPENDENT_AMBULATORY_CARE_PROVIDER_SITE_OTHER): Payer: Medicare Other | Admitting: Family Medicine

## 2023-03-07 VITALS — BP 136/74 | HR 71 | Ht 61.0 in | Wt 132.4 lb

## 2023-03-07 DIAGNOSIS — G43109 Migraine with aura, not intractable, without status migrainosus: Secondary | ICD-10-CM | POA: Diagnosis not present

## 2023-03-07 NOTE — Telephone Encounter (Signed)
Filled out [facility] Form 03/07/2023 Gave Form to Infusion Suite

## 2023-03-21 ENCOUNTER — Other Ambulatory Visit: Payer: Self-pay

## 2023-03-27 ENCOUNTER — Other Ambulatory Visit: Payer: Self-pay | Admitting: Family Medicine

## 2023-03-27 ENCOUNTER — Encounter: Payer: Self-pay | Admitting: Family Medicine

## 2023-03-27 MED ORDER — QULIPTA 60 MG PO TABS: 60.0000 mg | ORAL_TABLET | Freq: Every day | ORAL | 3 refills | Status: DC

## 2023-03-27 MED ORDER — UBRELVY 100 MG PO TABS: ORAL_TABLET | ORAL | 5 refills | Status: DC

## 2023-04-03 DIAGNOSIS — Z79899 Other long term (current) drug therapy: Secondary | ICD-10-CM | POA: Diagnosis not present

## 2023-04-03 DIAGNOSIS — Z0000 Encounter for general adult medical examination without abnormal findings: Secondary | ICD-10-CM | POA: Diagnosis not present

## 2023-04-03 DIAGNOSIS — E7800 Pure hypercholesterolemia, unspecified: Secondary | ICD-10-CM | POA: Diagnosis not present

## 2023-04-03 DIAGNOSIS — E559 Vitamin D deficiency, unspecified: Secondary | ICD-10-CM | POA: Diagnosis not present

## 2023-04-04 ENCOUNTER — Ambulatory Visit: Payer: BC Managed Care – PPO | Attending: Family Medicine

## 2023-04-04 ENCOUNTER — Other Ambulatory Visit: Payer: Self-pay | Admitting: *Deleted

## 2023-04-04 DIAGNOSIS — R002 Palpitations: Secondary | ICD-10-CM

## 2023-04-04 NOTE — Progress Notes (Unsigned)
Enrolled for [employer] to mail a ZIO XT long term holter monitor to the patients address on file.   DOD to read. 

## 2023-04-05 ENCOUNTER — Telehealth: Payer: Self-pay

## 2023-04-05 ENCOUNTER — Other Ambulatory Visit (HOSPITAL_COMMUNITY): Payer: Self-pay

## 2023-04-05 ENCOUNTER — Other Ambulatory Visit: Payer: Self-pay | Admitting: Obstetrics and Gynecology

## 2023-04-05 ENCOUNTER — Other Ambulatory Visit (HOSPITAL_COMMUNITY): Payer: Self-pay | Admitting: Family Medicine

## 2023-04-05 DIAGNOSIS — R002 Palpitations: Secondary | ICD-10-CM

## 2023-04-05 NOTE — Telephone Encounter (Signed)
Pharmacy Patient Advocate Encounter   Received notification from [facility] that prior authorization for Qulipta 60MG  tablets is required/requested.   PA submitted to [facility] via CoverMyMeds Key or (Medicaid) confirmation # [id number] Status is pending

## 2023-04-05 NOTE — Telephone Encounter (Signed)
Med refill request: estradiol Last AEX: 11/28/22 Next AEX: not scheduled Last MMG (if hormonal med) 07/23/22 Refill authorized: Please Advise, #[phone], 3 RF

## 2023-04-08 ENCOUNTER — Other Ambulatory Visit: Payer: Self-pay | Admitting: *Deleted

## 2023-04-08 DIAGNOSIS — R002 Palpitations: Secondary | ICD-10-CM | POA: Diagnosis not present

## 2023-04-08 DIAGNOSIS — G43109 Migraine with aura, not intractable, without status migrainosus: Secondary | ICD-10-CM | POA: Diagnosis not present

## 2023-04-15 ENCOUNTER — Ambulatory Visit (HOSPITAL_COMMUNITY): Payer: BC Managed Care – PPO | Attending: Cardiovascular Disease

## 2023-04-15 DIAGNOSIS — Z8249 Family history of ischemic heart disease and other diseases of the circulatory system: Secondary | ICD-10-CM | POA: Insufficient documentation

## 2023-04-15 DIAGNOSIS — R002 Palpitations: Secondary | ICD-10-CM | POA: Insufficient documentation

## 2023-04-15 DIAGNOSIS — E785 Hyperlipidemia, unspecified: Secondary | ICD-10-CM | POA: Diagnosis not present

## 2023-04-15 DIAGNOSIS — I071 Rheumatic tricuspid insufficiency: Secondary | ICD-10-CM | POA: Diagnosis not present

## 2023-04-15 DIAGNOSIS — I361 Nonrheumatic tricuspid (valve) insufficiency: Secondary | ICD-10-CM | POA: Diagnosis not present

## 2023-04-15 DIAGNOSIS — J45909 Unspecified asthma, uncomplicated: Secondary | ICD-10-CM | POA: Insufficient documentation

## 2023-04-15 LAB — ECHOCARDIOGRAM COMPLETE
Area-P 1/2: 3.78 cm2
S' Lateral: 2.2 cm

## 2023-04-16 NOTE — Telephone Encounter (Signed)
Pharmacy Patient Advocate Encounter  Prior Authorization for Qulipta 60MG  tablets has been APPROVED by [facility] from 04/09/2023 to 04/07/2024.   PA # PA Case ID #: [id number]

## 2023-04-30 DIAGNOSIS — R002 Palpitations: Secondary | ICD-10-CM | POA: Diagnosis not present

## 2023-06-16 IMAGING — CT CT ABD-PELV W/ CM
1 of 3 series · 13 of 32 positions shown, 19 images · IV contrast (agent unspecified)
Comparison: 06/21/2017

CLINICAL DATA: Abdominal pain and nausea and vomiting for 2 months.
Left adnexal mass.

EXAM:
CT ABDOMEN AND PELVIS WITH CONTRAST
TECHNIQUE: Multidetector CT imaging of the abdomen and pelvis was performed
using the standard protocol following bolus administration of
intravenous contrast.

[Series 2: abd/pelvis w/cm · axial · 0.67mm/px · z∈[-436,-76]mm · 13 of 86 slices shown, 19 images]
[im 7/86  soft-tissue]
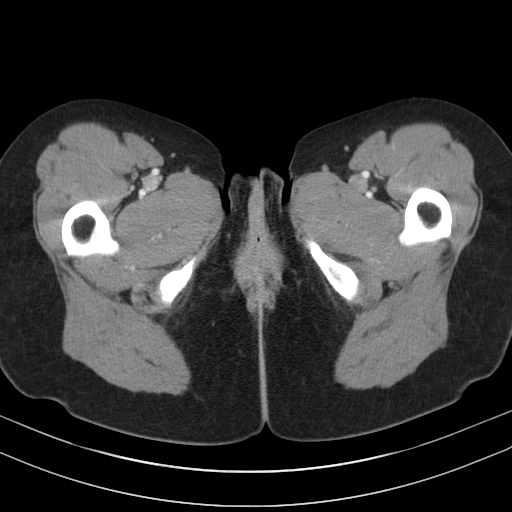
[im 7/86  bone]
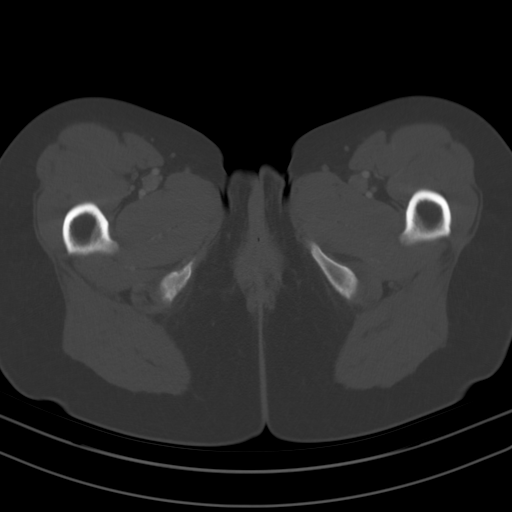
[im 13/86  soft-tissue]
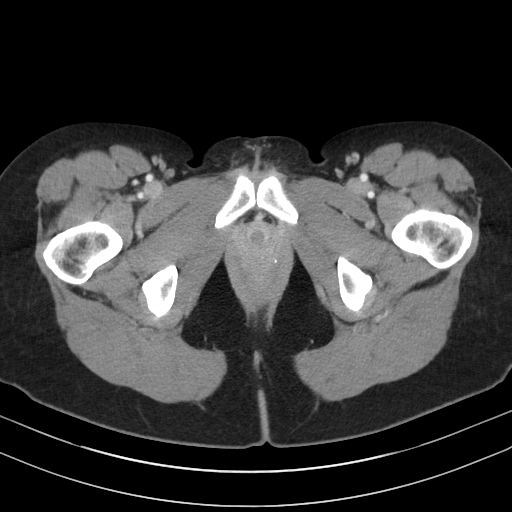
[im 19/86  soft-tissue]
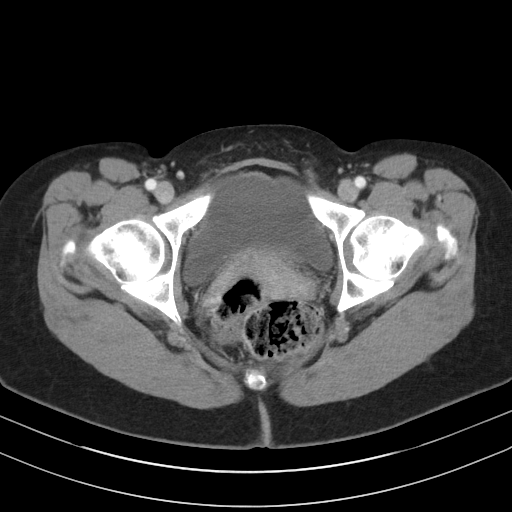
[im 25/86  soft-tissue]
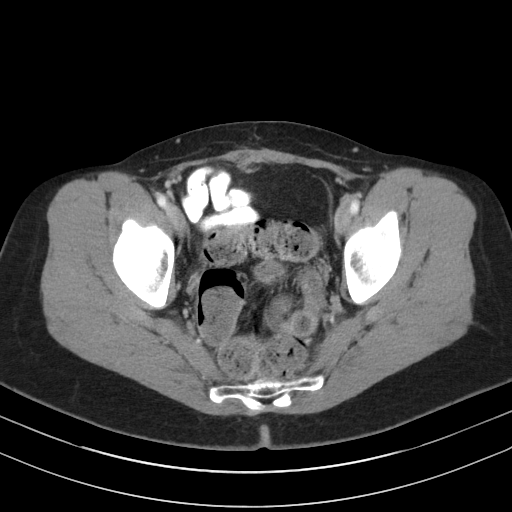
[im 31/86  soft-tissue]
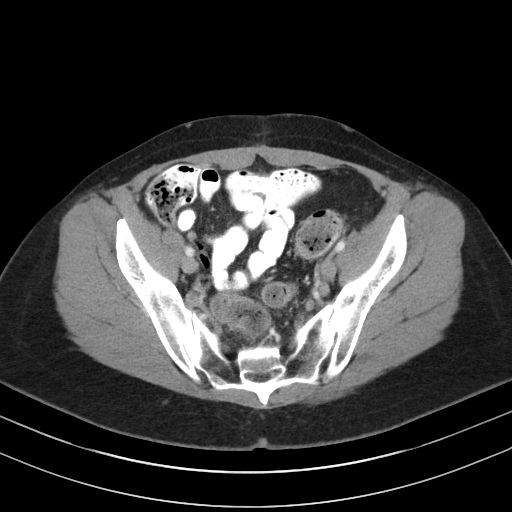
[im 37/86  soft-tissue]
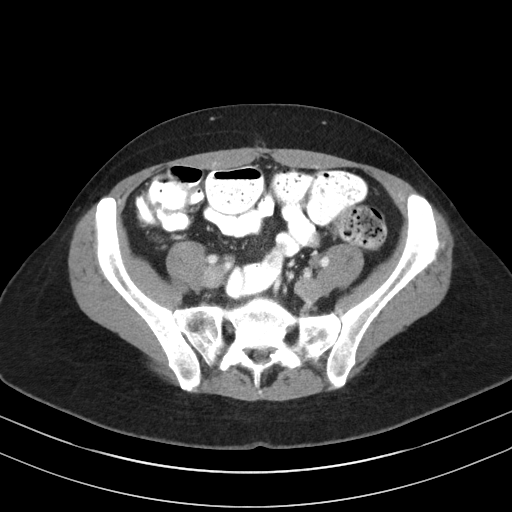
[im 43/86  soft-tissue]
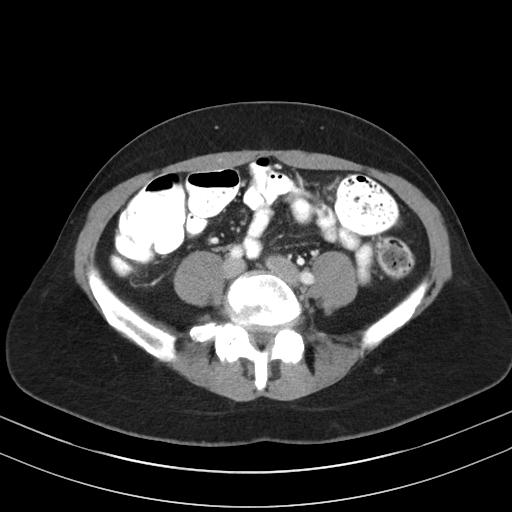
[im 49/86  soft-tissue]
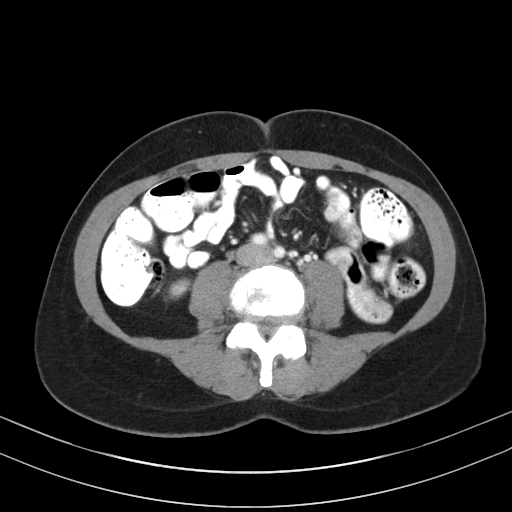
[im 55/86  soft-tissue]
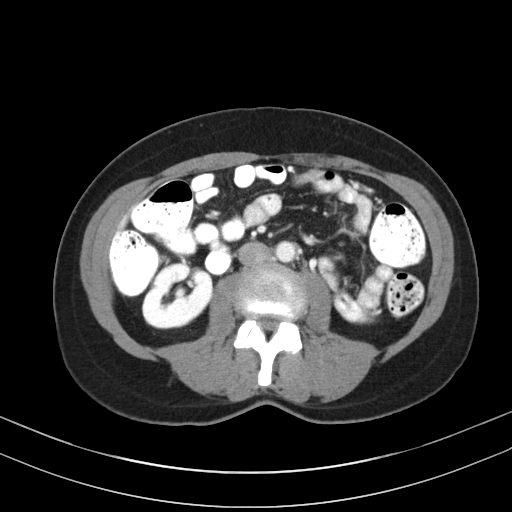
[im 55/86  bone]
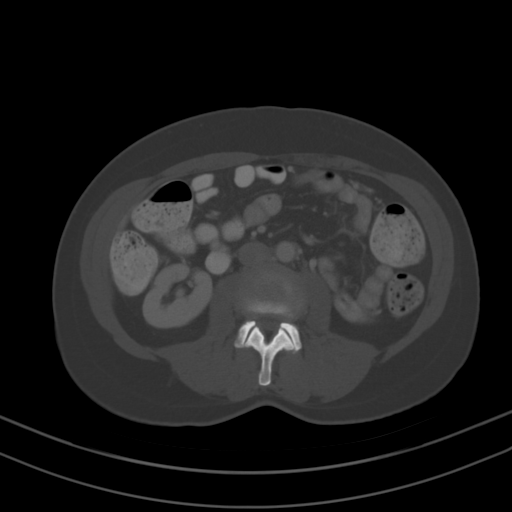
[im 61/86  soft-tissue]
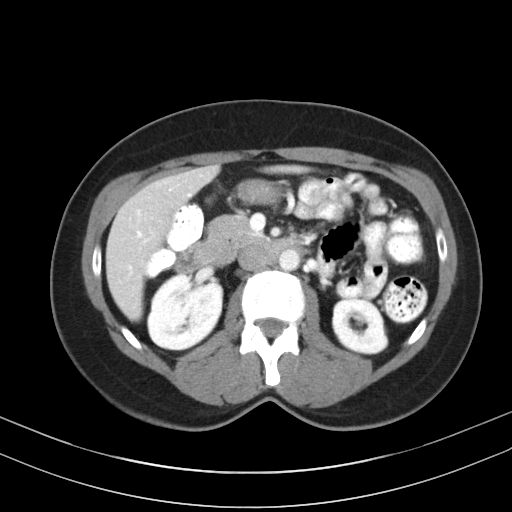
[im 61/86  lung]
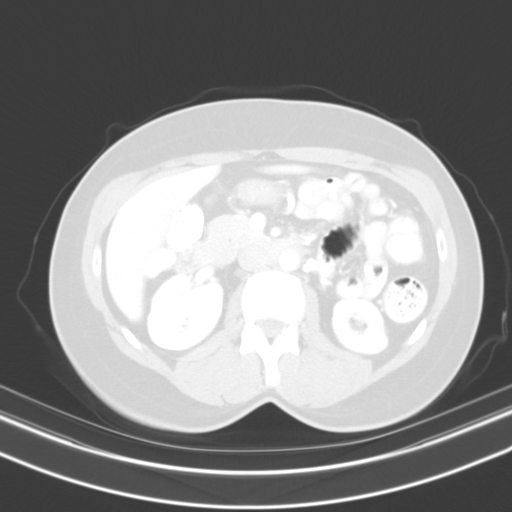
[im 67/86  soft-tissue]
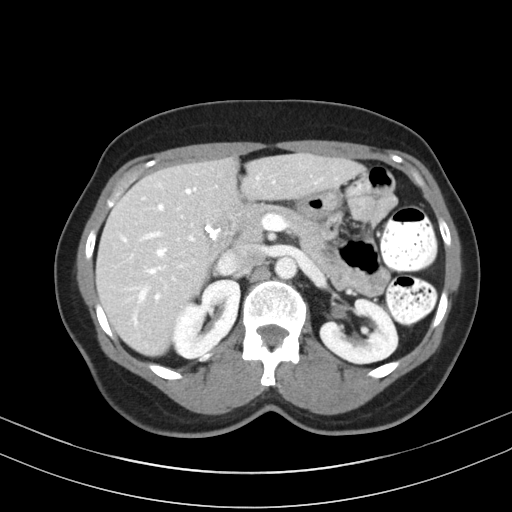
[im 67/86  lung]
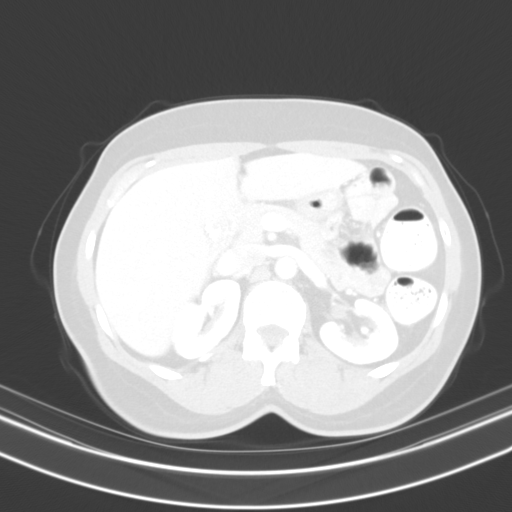
[im 73/86  soft-tissue]
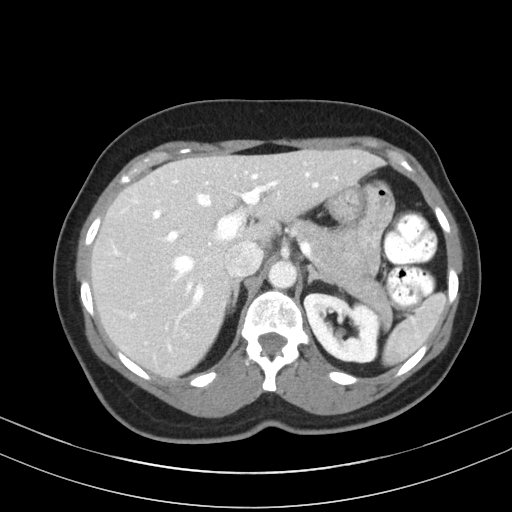
[im 73/86  lung]
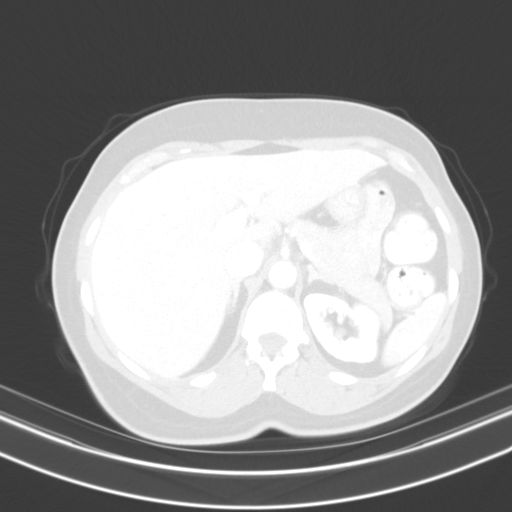
[im 79/86  soft-tissue]
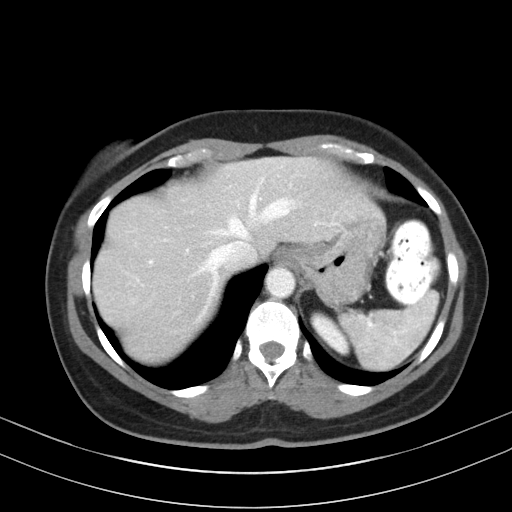
[im 79/86  lung]
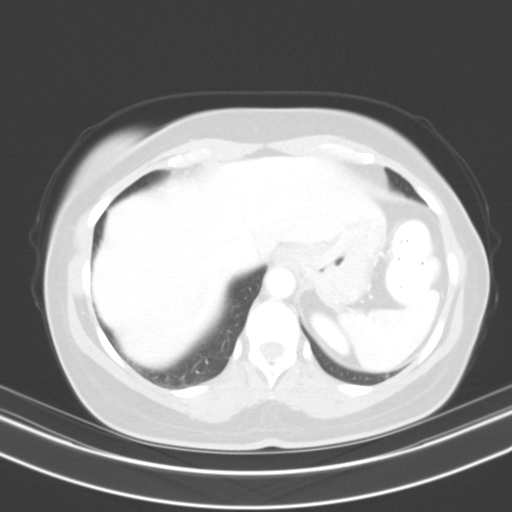

[13 of 32 positions shown; findings below may reference images not displayed]

RADIATION DOSE REDUCTION: This exam was performed according to the
departmental dose-optimization program which includes automated
exposure control, adjustment of the mA and/or kV according to
patient size and/or use of iterative reconstruction technique.

CONTRAST:  100mL [id number] IOPAMIDOL ([id number]) INJECTION 61%
FINDINGS: Lower Chest: No acute findings.

Hepatobiliary: No hepatic masses identified. Prior cholecystectomy.
No evidence of biliary obstruction.

Pancreas:  No mass or inflammatory changes.

Spleen: Within normal limits in size and appearance.

Adrenals/Urinary Tract: No masses identified. No evidence of
ureteral calculi or hydronephrosis.

Stomach/Bowel: No evidence of obstruction, inflammatory process or
abnormal fluid collections. Normal appendix visualized.

Vascular/Lymphatic: No pathologically enlarged lymph nodes. No acute
vascular findings.

Reproductive: Prior hysterectomy noted. Adnexal regions are
unremarkable in appearance. No evidence of adnexal or pelvic mass.
No evidence of inflammatory process or fluid collections.

Other:  None.

Musculoskeletal:  No suspicious bone lesions identified.
IMPRESSION: No evidence of pelvic mass. No acute findings or other significant
abnormality.

## 2023-07-30 ENCOUNTER — Ambulatory Visit (HOSPITAL_BASED_OUTPATIENT_CLINIC_OR_DEPARTMENT_OTHER): Payer: BC Managed Care – PPO | Admitting: Cardiology

## 2023-08-02 ENCOUNTER — Ambulatory Visit (HOSPITAL_BASED_OUTPATIENT_CLINIC_OR_DEPARTMENT_OTHER): Payer: BC Managed Care – PPO | Admitting: Cardiology

## 2023-08-02 ENCOUNTER — Encounter (HOSPITAL_BASED_OUTPATIENT_CLINIC_OR_DEPARTMENT_OTHER): Payer: Self-pay | Admitting: Cardiology

## 2023-08-02 VITALS — BP 132/72 | HR 84 | Ht 61.0 in | Wt 133.0 lb

## 2023-08-02 DIAGNOSIS — Z136 Encounter for screening for cardiovascular disorders: Secondary | ICD-10-CM | POA: Diagnosis not present

## 2023-08-02 DIAGNOSIS — Z712 Person consulting for explanation of examination or test findings: Secondary | ICD-10-CM | POA: Diagnosis not present

## 2023-08-02 DIAGNOSIS — R002 Palpitations: Secondary | ICD-10-CM | POA: Diagnosis not present

## 2023-08-02 DIAGNOSIS — Z7189 Other specified counseling: Secondary | ICD-10-CM

## 2023-08-02 DIAGNOSIS — Z8249 Family history of ischemic heart disease and other diseases of the circulatory system: Secondary | ICD-10-CM

## 2023-08-02 NOTE — Patient Instructions (Signed)
Medication Instructions:  Your physician recommends that you continue on your current medications as directed. Please refer to the Current Medication list given to you today.   Labwork: NONE  Testing/Procedures: CALCIUM SCORE - THIS WILL BE $99 OUT OF POCKET  Follow-Up: AS NEEDED   Any Other Special Instructions Will Be Listed Below (If Applicable).  Coronary Calcium Scan A coronary calcium scan is an imaging test used to look for deposits of plaque in the inner lining of the blood vessels of the heart (coronary arteries). Plaque is made up of calcium, protein, and fatty substances. These deposits of plaque can partly clog and narrow the coronary arteries without producing any symptoms or warning signs. This puts a person at risk for a heart attack. A coronary calcium scan is performed using a computed tomography (CT) scanner machine without using a dye (contrast). This test is recommended for people who are at moderate risk for heart disease. The test can find plaque deposits before symptoms develop. Tell a health care provider about: Any allergies you have. All medicines you are taking, including vitamins, herbs, eye drops, creams, and over-the-counter medicines. Any problems you or family members have had with anesthetic medicines. Any bleeding problems you have. Any surgeries you have had. Any medical conditions you have. Whether you are pregnant or may be pregnant. What are the risks? Generally, this is a safe procedure. However, problems may occur, including: Harm to a pregnant woman and her unborn baby. This test involves the use of radiation. Radiation exposure can be dangerous to a pregnant woman and her unborn baby. If you are pregnant or think you may be pregnant, you should not have this procedure done. A slight increase in the risk of cancer. This is because of the radiation involved in the test. The amount of radiation from one test is similar to the amount of radiation you  are naturally exposed to over one year. What happens before the procedure? Ask your health care provider for any specific instructions on how to prepare for this procedure. You may be asked to avoid products that contain caffeine, tobacco, or nicotine for 4 hours before the procedure. What happens during the procedure?  You will undress and remove any jewelry from your neck or chest. You may need to remove hearing aides and dentures. Women may need to remove their bras. You will put on a hospital gown. Sticky electrodes will be placed on your chest. The electrodes will be connected to an electrocardiogram (ECG) machine to record a tracing of the electrical activity of your heart. You will lie down on your back on a curved bed that is attached to the CT scanner. You may be given medicine to slow down your heart rate so that clear pictures can be created. You will be moved into the CT scanner, and the CT scanner will take pictures of your heart. During this time, you will be asked to lie still and hold your breath for 10-20 seconds at a time while each picture of your heart is being taken. The procedure may vary among health care providers and hospitals. What can I expect after the procedure? You can return to your normal activities. It is up to you to get the results of your procedure. Ask your health care provider, or the department that is doing the procedure, when your results will be ready. Summary A coronary calcium scan is an imaging test used to look for deposits of plaque in the inner lining of the  blood vessels of the heart. Plaque is made up of calcium, protein, and fatty substances. A coronary calcium scan is performed using a CT scanner machine without contrast. Generally, this is a safe procedure. Tell your health care provider if you are pregnant or may be pregnant. Ask your health care provider for any specific instructions on how to prepare for this procedure. You can return to  your normal activities after the scan is done. This information is not intended to replace advice given to you by your health care provider. Make sure you discuss any questions you have with your health care provider. Document Revised: 09/03/2021 Document Reviewed: 09/03/2021 Elsevier Patient Education  2024 [employer].

## 2023-08-02 NOTE — Progress Notes (Signed)
Cardiology Office Note:  .    Date:  08/02/2023  ID:  Stephanie Hancock, DOB November 11, 1965, MRN [id number] PCP: Mila Palmer, MD  [facility] Providers Cardiologist:  Jodelle Red, MD     History of Present Illness: .    Stephanie Hancock is a 57 y.o. female with a hx of hyperlipidemia, asthma, migraines, anemia, here for the evaluation of palpitations at the request of Dr. Mila Palmer. Referral notes personally reviewed. She was seen by Dr. Paulino Rily 04/03/2023 and complained of fluttering palpitations at least 1-2 times a week. Also noted chronic headaches, Neurology to start infusion therapy in 04/2023. She was referred to cardiology for further evaluation.  She wore a monitor 04/2023 revealing predominantly NSR, 7 runs of SVT with longest lasting 14 beats, rare SVE and VE (<1%). Patient triggered events correlated with NSR and one brief run of suspected Atach.   Tachycardia/palpitations: -Initial onset: Her palpitations typically occur when she is sitting, not when jogging.  -Frequency/Duration: She confirms having intermittent episodes of "funny" and "fluttery" heart beats which are typically brief. -Associated symptoms: None. -Aggravating/alleviating factors: Palpitations resolve after she focuses on her breathing for a short time. -Syncope/near syncope: None. -Prior cardiac history: None. -Prior workup: Monitor as above. Echo 04/15/2023 with LVEF 65-70%, normal diastolic parameters, no valvular abnormalities.  -Prior treatment:  None. -Possible medication interactions: None. -Caffeine: Usually drinks 1 cup of coffee daily, sometimes none. -Alcohol: Up to 2 beers every evening.  -Tobacco: Never. -OTC supplements:  None. -Comorbidities: She has struggled with migraines since she was 57 yo. -Exercise level: Exercises with jogging. She goes to the gym 2-3 times a week for an hour. Brief moments of dyspnea after climbing stairs. -Labs: TSH, kidney function/electrolytes, CBC  reviewed. -Cardiac ROS: She denies any chest pain, peripheral edema, lightheadedness, syncope, orthopnea, or PND. -Family history:  Her mother has a heart murmur. Her son has a heart murmur, had exacerbated cardiac symptoms from initial COVID vaccination. Her father had a major heart attack at age 46-46, still living at 58 yo. Her paternal grandmother died young; paternal grandfather died in his 8's. Her paternal uncle died of a heart attack in his 42's.   ROS:  Please see the history of present illness. ROS otherwise negative except as noted.  (+) Intermittent palpitations (+) Chronic migraines  Studies Reviewed: Marland Kitchen    EKG Interpretation Date/Time:  Friday August 02 2023 15:53:55 EDT Ventricular Rate:  84 PR Interval:  138 QRS Duration:  72 QT Interval:  378 QTC Calculation: 446 R Axis:   33  Text Interpretation: Normal sinus rhythm Normal ECG Confirmed by Jodelle Red [phone]) on 08/02/2023 4:01:38 PM    Monitor 04/2023:   Patch wear time was 13 days and 20 hours   Predominant rhythm was NSR with average HR 80bpm   7 rubs of SVT with longest lasting 14 beats   Rare SVE, rare VE (<1%)   Patient triggered events correlated with NSR and one brief run of suspected Atach   Patch Wear Time:  13 days and 20 hours (2024-07-01T13:27:21-0400 to 2024-07-15T09:58:34-0400)   Patient had a min HR of 59 bpm, max HR of 226 bpm, and avg HR of 80 bpm. Predominant underlying rhythm was Sinus Rhythm. 7 Supraventricular Tachycardia runs occurred, the run with the fastest interval lasting 4 beats with a max rate of 226 bpm, the  longest lasting 14 beats with an avg rate of 153 bpm. Some episodes of Supraventricular Tachycardia may be possible Atrial Tachycardia with  variable block. Supraventricular Tachycardia was detected within +/- 45 seconds of symptomatic patient event(s).  Isolated SVEs were rare (<1.0%), SVE Couplets were rare (<1.0%), and SVE Triplets were rare (<1.0%). Isolated VEs were  rare (<1.0%), VE Couplets were rare (<1.0%), and no VE Triplets were present. Difficulty discerning atrial activity during periods of  high heart rate, making definitive diagnosis between Sinus Tachycardia and Atrial Tachycardia difficult to ascertain.  Echo  04/15/2023:  1. Left ventricular ejection fraction, by estimation, is 65 to 70%. The  left ventricle has normal function. The left ventricle has no regional  wall motion abnormalities. Left ventricular diastolic parameters were  normal.   2. Right ventricular systolic function is normal. The right ventricular  size is normal. There is normal pulmonary artery systolic pressure.   3. The mitral valve is normal in structure. No evidence of mitral valve  regurgitation. No evidence of mitral stenosis.   4. The aortic valve is tricuspid. Aortic valve regurgitation is not  visualized. No aortic stenosis is present.   5. The inferior vena cava is normal in size with greater than 50%  respiratory variability, suggesting right atrial pressure of 3 mmHg.   Physical Exam:    VS:  BP 132/72   Pulse 84   Ht 5\' 1"  (1.549 m)   Wt 133 lb (60.3 kg)   LMP 03/29/2017   SpO2 97%   BMI 25.13 kg/m    Wt Readings from Last 3 Encounters:  08/02/23 133 lb (60.3 kg)  03/07/23 132 lb 6.4 oz (60.1 kg)  11/28/22 134 lb (60.8 kg)    GEN: Well nourished, well developed in no acute distress HEENT: Normal, moist mucous membranes NECK: No JVD CARDIAC: regular rhythm, normal S1 and S2, no rubs or gallops. No murmur. VASCULAR: Radial and DP pulses 2+ bilaterally. No carotid bruits RESPIRATORY:  Clear to auscultation without rales, wheezing or rhonchi  ABDOMEN: Soft, non-tender, non-distended MUSCULOSKELETAL:  Ambulates independently SKIN: Warm and dry, no edema NEUROLOGIC:  Alert and oriented x 3. No focal neuro deficits noted. PSYCHIATRIC:  Normal affect   ASSESSMENT AND PLAN: .    Palpitations -reviewed monitor results, echo results -no high risk  features -reviewed red flag warning signs that need immediate medical attention  Family history of heart disease Screening for heart disease -we reviewed the pros/cons of calcium scores. A cardiac CT scan for coronary calcium is a non-invasive way of obtaining information about the presence, location and extent of calcified plaque in the coronary arteries--the vessels that supply oxygen-containing blood to the heart muscle. Calcified plaque results when there is a build-up of fat and other substances under the inner layer of the artery. This material can calcify which signals the presence of atherosclerosis, a disease of the vessel wall, also called coronary artery disease.  People with this disease have an increased risk for heart attacks. In addition, over time, progression of plaque build up (CAD) can narrow the arteries or even close off blood flow to the heart. Because calcium is a marker of CAD, the amount of calcium detected on a cardiac CT scan is a helpful prognostic tool.  -we reviewed the charts together which show the relationship between calcium score and 15 year all cause mortality -after shared decision making, will proceed with coronary calcium score. They understand this is an out of pocket/self pay test currently costing $99. -if calcium score nonzero, we did preliminarily discuss statin and the data for benefit   CV risk counseling and prevention -  recommend heart healthy/Mediterranean diet, with whole grains, fruits, vegetable, fish, lean meats, nuts, and olive oil. Limit salt. -recommend moderate walking, 3-5 times/week for 30-50 minutes each session. Aim for at least 150 minutes.week. Goal should be pace of 3 miles/hours, or walking 1.5 miles in 30 minutes -recommend avoidance of tobacco products. Avoid excess alcohol. -ASCVD risk score: The ASCVD Risk score (Arnett DK, et al., 2019) failed to calculate for the following reasons:   Cannot find a previous HDL lab   Cannot find a  previous total cholesterol lab   Dispo: Follow-up with me as needed.  I,Mathew Stumpf,acting as a Neurosurgeon for [employer], MD.,have documented all relevant documentation on the behalf of Jodelle Red, MD,as directed by  Jodelle Red, MD while in the presence of Jodelle Red, MD.  I, Jodelle Red, MD, have reviewed all documentation for this visit. The documentation on 09/22/23 for the exam, diagnosis, procedures, and orders are all accurate and complete.   Signed, Jodelle Red, MD

## 2023-08-24 DIAGNOSIS — Z1231 Encounter for screening mammogram for malignant neoplasm of breast: Secondary | ICD-10-CM | POA: Diagnosis not present

## 2023-08-28 ENCOUNTER — Encounter: Payer: Self-pay | Admitting: Obstetrics and Gynecology

## 2023-09-03 ENCOUNTER — Other Ambulatory Visit: Payer: Self-pay | Admitting: *Deleted

## 2023-09-03 MED ORDER — UBRELVY 100 MG PO TABS: ORAL_TABLET | ORAL | 0 refills | Status: DC

## 2023-09-03 NOTE — Telephone Encounter (Signed)
Last seen on 03/07/23 Follow up scheduled on 09/17/23 Last filled on 07/30/23

## 2023-09-06 ENCOUNTER — Ambulatory Visit (HOSPITAL_BASED_OUTPATIENT_CLINIC_OR_DEPARTMENT_OTHER): Payer: BC Managed Care – PPO | Attending: Cardiology

## 2023-09-10 ENCOUNTER — Telehealth (HOSPITAL_BASED_OUTPATIENT_CLINIC_OR_DEPARTMENT_OTHER): Payer: Self-pay | Admitting: Cardiology

## 2023-09-16 ENCOUNTER — Telehealth: Payer: Self-pay | Admitting: Family Medicine

## 2023-09-16 NOTE — Telephone Encounter (Signed)
The below has been noted.

## 2023-09-16 NOTE — Telephone Encounter (Signed)
..   Pt understands that although there may be some limitations with this type of visit, we will take all precautions to reduce any security or privacy concerns.  Pt understands that this will be treated like an in office visit and we will file with pt's insurance, and there may be a patient responsible charge related to this service. ? ?

## 2023-09-17 ENCOUNTER — Telehealth (INDEPENDENT_AMBULATORY_CARE_PROVIDER_SITE_OTHER): Payer: BC Managed Care – PPO | Admitting: Family Medicine

## 2023-09-17 ENCOUNTER — Encounter: Payer: Self-pay | Admitting: Family Medicine

## 2023-09-17 DIAGNOSIS — G43109 Migraine with aura, not intractable, without status migrainosus: Secondary | ICD-10-CM | POA: Diagnosis not present

## 2023-09-17 MED ORDER — UBRELVY 100 MG PO TABS: ORAL_TABLET | ORAL | 0 refills | Status: DC

## 2023-09-17 MED ORDER — QULIPTA 60 MG PO TABS: 60.0000 mg | ORAL_TABLET | Freq: Every day | ORAL | 3 refills | Status: AC

## 2023-09-17 NOTE — Progress Notes (Signed)
PATIENT: Stephanie Hancock DOB: 09/05/1966  REASON FOR VISIT: follow up HISTORY FROM: patient  Virtual Visit via Telephone Note  I connected with Stephanie Hancock on 09/17/23 at  3:00 PM EST by telephone and verified that I am speaking with the correct person using two identifiers.   I discussed the limitations, risks, security and privacy concerns of performing an evaluation and management service by telephone and the availability of in person appointments. I also discussed with the patient that there may be a patient responsible charge related to this service. The patient expressed understanding and agreed to proceed.   History of Present Illness:  09/17/23 ALL: Stephanie Hancock is a 57 y.o. female here today for follow up for migraines. She was last seen 02/2023 and we switched Qulipta to [place]. Cost was too expensive so she requested to continue Qulipta. She reports getting an infusion a few months ago but felt it may headaches worse. She has continued [place] for prevention and Ubrelvy for abortive therapy. Since, she reports headaches are stable. She may have 8-10 per month. She feels current plan has been most effective for her. She is tolerating meds well.   Medications tried and failed: Qulipta, [employer] (on now), Ajovy (rash), Amovig (laxtex allergy), Patient had hair loss with topiramate (hair loss), Zonegran (refused due to potential hair loss), Qudexy, Nortriptyline, levetiracetam (hallucinations), gabapentin, propranolol Abortive: Sumatriptan po, naratriptan, rizatriptan, zolmitriptan, tizanidine, Excedrin migraine, advil, steroids, diclofenac, Reglan and ketorolac injections, tramadol, zofran tablet.   03/07/23 ALL:  Stephanie Hancock returns for follow up for migraines. She was last seen 12/2021 and we switched Emgality to [place] and continued [place] as needed. Since, she reports seeing a little improvement on Qulipta but over the past two months, she is having more frequent migraines. She  is having about 15 headache days with at least 10 being migrainous. She is taking a full pack of Ubrelvy every month. Excedrin also helps. Usually taking 2-3 tablets a week. Smells and sun are triggers. She is not interested in Botox.   Medications tried and failed: Qulipta, [employer] officer (on now), Ajovy (rash), Amovig (laxtex allergy), Patient had hair loss with topiramate (hair loss), Zonegran (refused due to potential hair loss), Qudexy, Nortriptyline, levetiracetam (hallucinations), gabapentin, propranolol Abortive: Sumatriptan po, naratriptan, rizatriptan, zolmitriptan, tizanidine, Excedrin migraine, advil, steroids, diclofenac, Reglan and ketorolac injections, tramadol, zofran tablet.   12/27/2021 ALL: Stephanie Hancock returns for follow up for migraines. She continues [place]. She feels migraines are unchanged. She continues to have about 10 migraines per month. She knows that smells are most common trigger. Bernita Raisin helps most of the time but occasionally she has to take second dose with Excedrin. She continues to have significant anxiety with administering injections. She has to have her husband help her. She would prefer oral treatment if possible. She is not interested in Botox.   Medications tried and failed: Emgality (on now), Ajovy (rash), Amovig (laxtex allergy), Patient had hair loss with topiramate (hair loss), Zonegran (refused due to potential hair loss), Qudexy, Nortriptyline, levetiracetam (hallucinations), gabapentin, propranolol Abortive: Sumatriptan po, naratriptan, rizatriptan, zolmitriptan, tizanidine, Excedrin migraine, advil, steroids, diclofenac, Reglan and ketorolac injections, tramadol, zofran tablet.   11/16/2020 ALL: She returns for migraine follow up. She continues [place].  She reports that she is doing really well.  She is tolerating medications without obvious adverse effects.  She continues to have about 10 migraine days per month.  She is using 5-8 doses of  Ubrelvy each month.  Occasionally she will use  Excedrin with Bernita Raisin.  She feels that migraines are very well managed at this time.  She does note that she occasionally has black dots/bugs in her peripheral vision prior to headache.  Abortive medicines help with this.  Symptoms are not new and have improved significantly with migraine management.  Eye exam last over a year ago.  No other concerns of hallucinations or mood changes.  05/16/2020 ALL: Stephanie Hancock is a 57 y.o. female here today for follow up for migraines. She was started on Emgality in 12/2019. She has had 4 injections. Allergies (rash, whelps) with Amovig and Ajovy. She has not needed to Benadryl. She reports migraines are improved in frequency and intensity. She reports having about 8 migraines a month. Bernita Raisin helps with abortive therapy. She uses Excedrin as well and feels she uses it more often than she should. She continues discussion of weaning Estrace with PCP.   She is planning to have a cholecystectomy on 8/31. She has 12 tablets of oxycodone for post op. She does not take this medication. She is eating differently which she feels has helped. She has eliminated fried and spicy foods. She is eating more fruits. She is drinking more water.    Observations/Objective:  Generalized: Well developed, in no acute distress  Mentation: Alert oriented to time, place, history taking. Follows all commands speech and language fluent   Assessment and Plan:  57 y.o. year old female  has a past medical history of Abnormal uterine bleeding, Allergic rhinitis, Anemia, Asthma, Chest pain, Depression, Fibroid, Gall bladder stones, H. pylori infection, History of COVID-19 (05/30/2020), History of esophagogastroduodenoscopy (EGD) (2016), Hypercholesterolemia, Iron deficiency anemia, Leiomyoma of uterus, Migraine, Mild recurrent major depression (HCC), Palpitations, Patient is Jehovah's Witness, S/P hysterectomy, Transaminitis, and Vitamin D  deficiency. here with    ICD-10-CM   1. Migraine with aura and without status migrainosus, not intractable  G43.109      Stephanie Hancock is doing well. We will continue Qulipta 60mg  daily and Ubrelvy as needed. Refills sent. She will continue healthy lifestyle habits. She will follow up with me in 1 year, sooner if needed.   No orders of the defined types were placed in this encounter.   Meds ordered this encounter  Medications   Atogepant (QULIPTA) 60 MG TABS    Sig: Take 1 tablet (60 mg total) by mouth daily.    Dispense:  90 tablet    Refill:  3    Order Specific Question:   Supervising Provider    Answer:   Anson Fret [[id number]]   Ubrogepant (UBRELVY) 100 MG TABS    Sig: TAKE 1 TABLET BY MOUTH EVERY 2 HOURS AS NEEDED, MAXIMUM DAILY DOSE IS 2 TABLETS    Dispense:  10 tablet    Refill:  0    Order Specific Question:   Supervising Provider    Answer:   Anson Fret [[id number]]     Follow Up Instructions:  I discussed the assessment and treatment plan with the patient. The patient was provided an opportunity to ask questions and all were answered. The patient agreed with the plan and demonstrated an understanding of the instructions.   The patient was advised to call back or seek an in-person evaluation if the symptoms worsen or if the condition fails to improve as anticipated.  I provided 15 minutes of non-face-to-face time during this encounter. Patient located at their place of residence during Mychart visit. Provider is in the office.    Shawnie Dapper, NP

## 2023-09-17 NOTE — Patient Instructions (Signed)
Below is our plan:  We will continue Qulipta 60mg  daily and Ubrelvy as needed.   Please make sure you are staying well hydrated. I recommend 50-60 ounces daily. Well balanced diet and regular exercise encouraged. Consistent sleep schedule with 6-8 hours recommended.   Please continue follow up with care team as directed.   Follow up with me in 1 year   You may receive a survey regarding today's visit. I encourage you to leave honest feed back as I do use this information to improve patient care. Thank you for seeing me today!   GENERAL HEADACHE INFORMATION:   Natural supplements: Magnesium Oxide or Magnesium Glycinate 500 mg at bed (up to 800 mg daily) Coenzyme Q10 300 mg in AM Vitamin B2- 200 mg twice a day   Add 1 supplement at a time since even natural supplements can have undesirable side effects. You can sometimes buy supplements cheaper (especially Coenzyme Q10) at www.WebmailGuide.co.za or at [facility].  Migraine with aura: There is increased risk for stroke in women with migraine with aura and a contraindication for the combined contraceptive pill for use by women who have migraine with aura. The risk for women with migraine without aura is lower. However other risk factors like smoking are far more likely to increase stroke risk than migraine. There is a recommendation for no smoking and for the use of OCPs without estrogen such as progestogen only pills particularly for women with migraine with aura.Marland Kitchen People who have migraine headaches with auras may be 3 times more likely to have a stroke caused by a blood clot, compared to migraine patients who don't see auras. Women who take hormone-replacement therapy may be 30 percent more likely to suffer a clot-based stroke than women not taking medication containing estrogen. Other risk factors like smoking and high blood pressure may be  much more important.    Vitamins and herbs that show potential:   Magnesium: Magnesium (250 mg twice a day or 500  mg at bed) has a relaxant effect on smooth muscles such as blood vessels. Individuals suffering from frequent or daily headache usually have low magnesium levels which can be increase with daily supplementation of 400-750 mg. Three trials found 40-90% average headache reduction  when used as a preventative. Magnesium may help with headaches are aura, the best evidence for magnesium is for migraine with aura is its thought to stop the cortical spreading depression we believe is the pathophysiology of migraine aura.Magnesium also demonstrated the benefit in menstrually related migraine.  Magnesium is part of the messenger system in the serotonin cascade and it is a good muscle relaxant.  It is also useful for constipation which can be a side effect of other medications used to treat migraine. Good sources include nuts, whole grains, and tomatoes. Side Effects: loose stool/diarrhea  Riboflavin (vitamin B 2) 200 mg twice a day. This vitamin assists nerve cells in the production of ATP a principal energy storing molecule.  It is necessary for many chemical reactions in the body.  There have been at least 3 clinical trials of riboflavin using 400 mg per day all of which suggested that migraine frequency can be decreased.  All 3 trials showed significant improvement in over half of migraine sufferers.  The supplement is found in bread, cereal, milk, meat, and poultry.  Most Americans get more riboflavin than the recommended daily allowance, however riboflavin deficiency is not necessary for the supplements to help prevent headache. Side effects: energizing, green urine  Coenzyme Q10: This is present in almost all cells in the body and is critical component for the conversion of energy.  Recent studies have shown that a nutritional supplement of CoQ10 can reduce the frequency of migraine attacks by improving the energy production of cells as with riboflavin.  Doses of 150 mg twice a day have been shown to be  effective.   Melatonin: Increasing evidence shows correlation between melatonin secretion and headache conditions.  Melatonin supplementation has decreased headache intensity and duration.  It is widely used as a sleep aid.  Sleep is natures way of dealing with migraine.  A dose of 3 mg is recommended to start for headaches including cluster headache. Higher doses up to 15 mg has been reviewed for use in Cluster headache and have been used. The rationale behind using melatonin for cluster is that many theories regarding the cause of Cluster headache center around the disruption of the normal circadian rhythm in the brain.  This helps restore the normal circadian rhythm.   HEADACHE DIET: Foods and beverages which may trigger migraine Note that only 20% of headache patients are food sensitive. You will know if you are food sensitive if you get a headache consistently 20 minutes to 2 hours after eating a certain food. Only cut out a food if it causes headaches, otherwise you might remove foods you enjoy! What matters most for diet is to eat a well balanced healthy diet full of vegetables and low fat protein, and to not miss meals.   Chocolate, other sweets ALL cheeses except cottage and cream cheese Dairy products, yogurt, sour cream, ice cream Liver Meat extracts (Bovril, Marmite, meat tenderizers) Meats or fish which have undergone aging, fermenting, pickling or smoking. These include: Hotdogs,salami,Lox,sausage, mortadellas,smoked salmon, pepperoni, Pickled herring Pods of broad bean (English beans, Chinese pea pods, [place] (fava) beans, lima and navy beans Ripe avocado, ripe banana Yeast extracts or active yeast preparations such as Brewer's or Fleishman's (commercial bakes goods are permitted) Tomato based foods, pizza (lasagna, etc.)   MSG (monosodium glutamate) is disguised as many things; look for these common aliases: Monopotassium glutamate Autolysed yeast Hydrolysed protein Sodium  caseinate "flavorings" "all natural preservatives" Nutrasweet   Avoid all other foods that convincingly provoke headaches.   Resources: The Dizzy [place]air Laundry Your Headache Diet, migrainestrong.com  https://zamora-andrews.com/   Caffeine and Migraine For patients that have migraine, caffeine intake more than 3 days per week can lead to dependency and increased migraine frequency. I would recommend cutting back on your caffeine intake as best you can. The recommended amount of caffeine is 200-300 mg daily, although migraine patients may experience dependency at even lower doses. While you may notice an increase in headache temporarily, cutting back will be helpful for headaches in the long run. For more information on caffeine and migraine, visit: https://americanmigrainefoundation.org/resource-library/caffeine-and-migraine/   Headache Prevention Strategies:   1. Maintain a headache diary; learn to identify and avoid triggers.  - This can be a simple note where you log when you had a headache, associated symptoms, and medications used - There are several smartphone apps developed to help track migraines: Migraine Buddy, Migraine Monitor, Curelator N1-Headache App   Common triggers include: Emotional triggers: Emotional/Upset family or friends Emotional/Upset occupation Business reversal/success Anticipation anxiety Crisis-serious Post-crisis periodNew job/position   Physical triggers: Vacation Day Weekend Strenuous Exercise High Altitude Location New Move Menstrual Day Physical Illness Oversleep/Not enough sleep Weather changes Light: Photophobia or light sesnitivity treatment involves a balance between desensitization and reduction  in overly strong input. Use dark polarized glasses outside, but not inside. Avoid bright or fluorescent light, but do not dim environment to the point that going into a normally lit room hurts. Consider  FL-41 tint lenses, which reduce the most irritating wavelengths without blocking too much light.  These can be obtained at axonoptics.com or theraspecs.com Foods: see list above.   2. Limit use of acute treatments (over-the-counter medications, triptans, etc.) to no more than 2 days per week or 10 days per month to prevent medication overuse headache (rebound headache).     3. Follow a regular schedule (including weekends and holidays): Don't skip meals. Eat a balanced diet. 8 hours of sleep nightly. Minimize stress. Exercise 30 minutes per day. Being overweight is associated with a 5 times increased risk of chronic migraine. Keep well hydrated and drink 6-8 glasses of water per day.   4. Initiate non-pharmacologic measures at the earliest onset of your headache. Rest and quiet environment. Relax and reduce stress. Breathe2Relax is a free app that can instruct you on    some simple relaxtion and breathing techniques. Http://Dawnbuse.com is a    free website that provides teaching videos on relaxation.  Also, there are  many apps that   can be downloaded for "mindful" relaxation.  An app called YOGA NIDRA will help walk you through mindfulness. Another app called Calm can be downloaded to give you a structured mindfulness guide with daily reminders and skill development. Headspace for guided meditation Mindfulness Based Stress Reduction Online Course: www.palousemindfulness.com Cold compresses.   5. Don't wait!! Take the maximum allowable dosage of prescribed medication at the first sign of migraine.   6. Compliance:  Take prescribed medication regularly as directed and at the first sign of a migraine.   7. Communicate:  Call your physician when problems arise, especially if your headaches change, increase in frequency/severity, or become associated with neurological symptoms (weakness, numbness, slurred speech, etc.). Proceed to emergency room if you experience new or worsening symptoms or  symptoms do not resolve, if you have new neurologic symptoms or if headache is severe, or for any concerning symptom.   8. Headache/pain management therapies: Consider various complementary methods, including medication, behavioral therapy, psychological counselling, biofeedback, massage therapy, acupuncture, dry needling, and other modalities.  Such measures may reduce the need for medications. Counseling for pain management, where patients learn to function and ignore/minimize their pain, seems to work very well.   9. Recommend changing family's attention and focus away from patient's headaches. Instead, emphasize daily activities. If first question of day is 'How are your headaches/Do you have a headache today?', then patient will constantly think about headaches, thus making them worse. Goal is to re-direct attention away from headaches, toward daily activities and other distractions.   10. Helpful Websites: www.AmericanHeadacheSociety.org PatentHood.ch www.headaches.org TightMarket.nl www.achenet.org

## 2023-10-03 ENCOUNTER — Other Ambulatory Visit (HOSPITAL_BASED_OUTPATIENT_CLINIC_OR_DEPARTMENT_OTHER): Payer: BC Managed Care – PPO

## 2023-10-20 ENCOUNTER — Encounter: Payer: Self-pay | Admitting: Family Medicine

## 2023-10-21 ENCOUNTER — Encounter (HOSPITAL_BASED_OUTPATIENT_CLINIC_OR_DEPARTMENT_OTHER): Payer: Self-pay

## 2023-10-24 ENCOUNTER — Other Ambulatory Visit (HOSPITAL_COMMUNITY): Payer: Self-pay

## 2023-10-24 ENCOUNTER — Encounter: Payer: Self-pay | Admitting: Family Medicine

## 2023-10-24 ENCOUNTER — Telehealth: Payer: Self-pay

## 2023-10-24 NOTE — Telephone Encounter (Signed)
Pharmacy Patient Advocate Encounter   Received notification from RX Request Messages that prior authorization for Ubrelvy 100MG  tablets is required/requested.   Insurance verification completed.   The patient is insured through [facility] .   Per test claim: PA required; PA submitted to above mentioned insurance via CoverMyMeds Key/confirmation #/EOC [id number] Status is pending

## 2023-10-24 NOTE — Telephone Encounter (Signed)
I checked CMM. There is already an authorization on file for the medication. It is $500 with PA on file.

## 2023-12-04 ENCOUNTER — Encounter (HOSPITAL_BASED_OUTPATIENT_CLINIC_OR_DEPARTMENT_OTHER): Payer: Self-pay | Admitting: Cardiology

## 2023-12-17 ENCOUNTER — Other Ambulatory Visit: Payer: Self-pay

## 2023-12-17 DIAGNOSIS — Z79899 Other long term (current) drug therapy: Secondary | ICD-10-CM

## 2023-12-17 MED ORDER — ESTRADIOL 1 MG PO TABS: ORAL_TABLET | ORAL | 1 refills | Status: DC

## 2023-12-17 NOTE — Telephone Encounter (Signed)
 Med refill request: Estradiol 1 mg #90 Last AEX: 11/28/22  Next AEX: 03/31/24 Last MMG (if hormonal med) 08/24/23 BI-RADS 1 negative Refill authorized: Estradiol 1 mg #90, with 1 refill. Please approve or deny as appropriate

## 2024-02-06 ENCOUNTER — Telehealth: Payer: Self-pay

## 2024-02-06 ENCOUNTER — Other Ambulatory Visit (HOSPITAL_COMMUNITY): Payer: Self-pay

## 2024-02-06 NOTE — Telephone Encounter (Signed)
 Pharmacy Patient Advocate Encounter   Received notification from CoverMyMeds that prior authorization for Ubrelvy  100 MG tablets  is required/requested.   Insurance verification completed.   The patient is insured through [facility] .   Per test claim: The current 30 day co-pay is, $12.15.  No PA needed at this time. This test claim was processed through [facility]- copay amounts may vary at other pharmacies due to pharmacy/plan contracts, or as the patient moves through the different stages of their insurance plan.

## 2024-03-31 ENCOUNTER — Ambulatory Visit: Payer: Medicare Other | Admitting: Obstetrics and Gynecology

## 2024-04-08 ENCOUNTER — Other Ambulatory Visit (HOSPITAL_COMMUNITY): Payer: Self-pay

## 2024-04-08 DIAGNOSIS — Z79899 Other long term (current) drug therapy: Secondary | ICD-10-CM | POA: Diagnosis not present

## 2024-04-08 DIAGNOSIS — E559 Vitamin D deficiency, unspecified: Secondary | ICD-10-CM | POA: Diagnosis not present

## 2024-04-08 DIAGNOSIS — Z0000 Encounter for general adult medical examination without abnormal findings: Secondary | ICD-10-CM | POA: Diagnosis not present

## 2024-04-08 DIAGNOSIS — E7800 Pure hypercholesterolemia, unspecified: Secondary | ICD-10-CM | POA: Diagnosis not present

## 2024-04-14 ENCOUNTER — Telehealth: Payer: Self-pay

## 2024-04-14 ENCOUNTER — Other Ambulatory Visit (HOSPITAL_COMMUNITY): Payer: Self-pay

## 2024-04-14 NOTE — Telephone Encounter (Signed)
 Pharmacy Patient Advocate Encounter   Received notification from Fax that prior authorization for Qulipta  60MG  tablets is required/requested.   Insurance verification completed.   The patient is insured through [employer] .   Per test claim: PA required; PA submitted to above mentioned insurance via CoverMyMeds Key/confirmation #/EOC [id number] Status is pending

## 2024-04-14 NOTE — Telephone Encounter (Signed)
 Pharmacy Patient Advocate Encounter   Received notification from Fax that prior authorization for Ubrelvy  100MG  tablets is required/requested.   Insurance verification completed.   The patient is insured through [facility] .   Per test claim: PA required; PA submitted to above mentioned insurance via CoverMyMeds Key/confirmation #/EOC [id number] Status is pending

## 2024-04-17 ENCOUNTER — Telehealth: Payer: Self-pay

## 2024-04-17 ENCOUNTER — Other Ambulatory Visit (HOSPITAL_COMMUNITY): Payer: Self-pay

## 2024-04-17 NOTE — Telephone Encounter (Signed)
 Pharmacy Patient Advocate Encounter  Received notification from [facility] that Prior Authorization for Qulipta  60MG  tablets has been APPROVED from 04/14/2024 to 04/14/2025. Ran test claim, Copay is $1,006.24. This test claim was processed through [facility]- copay amounts may vary at other pharmacies due to pharmacy/plan contracts, or as the patient moves through the different stages of their insurance plan.   PA #/Case ID/Reference #: PA Case ID #: [id number]  VERY HIGH COPAY-PT HAS DEDUCTIBLE    I will submit to [facility] to see if they will approve and be more affordable.

## 2024-04-17 NOTE — Telephone Encounter (Signed)
 Pharmacy Patient Advocate Encounter  Received notification from [facility] that Prior Authorization for Ubrelvy  100MG  tablets has been APPROVED from 04/14/2024 to 04/14/2025. Ran test claim, Copay is $0. This test claim was processed through [facility]- copay amounts may vary at other pharmacies due to pharmacy/plan contracts, or as the patient moves through the different stages of their insurance plan.   PA #/Case ID/Reference #: PA Case ID #: [id number]

## 2024-04-17 NOTE — Telephone Encounter (Signed)
 Pharmacy Patient Advocate Encounter  Received notification from [employer] that Prior Authorization for Qulipta  60MG  tablets has been APPROVED from 04/17/2024 to 10/07/2024. Ran test claim, Copay is $12.15. This test claim was processed through [facility]- copay amounts may vary at other pharmacies due to pharmacy/plan contracts, or as the patient moves through the different stages of their insurance plan.   PA #/Case ID/Reference #: PA Case ID #: [id number]

## 2024-06-15 ENCOUNTER — Other Ambulatory Visit: Payer: Self-pay | Admitting: Obstetrics and Gynecology

## 2024-06-15 DIAGNOSIS — Z79818 Long term (current) use of other agents affecting estrogen receptors and estrogen levels: Secondary | ICD-10-CM

## 2024-06-15 NOTE — Telephone Encounter (Signed)
.  Med refill request:Estradiol   Last AEX:11/28/22 Next AEX:10/27/24 Last MMG (if hormonal med) 08/24/23 BI Rads CAT 1 Neg Refill authorized: Please Advise?

## 2024-06-16 NOTE — Telephone Encounter (Addendum)
 Pt called to say she is almost out of her estradiol  prescription and needs a refill sent, ASAP.  Pt has not been seen since her AEX on 11/28/22. Pt cancelled her June 2025 AEX, due to travel with her husband for a work trip. Rx was sent to [facility] last night at 7:45 pm.  Pt was advised to contact her pharmacy.  Pt expressed her immense gratitude to provider.  Pt states that, due to no available appointments, the earliest she can see her provider is not until January 2026. Pt would like to be notified, if any appointments become available sooner. Respectfully.

## 2024-06-17 DIAGNOSIS — Z23 Encounter for immunization: Secondary | ICD-10-CM | POA: Diagnosis not present

## 2024-07-12 DIAGNOSIS — L84 Corns and callosities: Secondary | ICD-10-CM | POA: Diagnosis not present

## 2024-07-12 DIAGNOSIS — B353 Tinea pedis: Secondary | ICD-10-CM | POA: Diagnosis not present

## 2024-08-26 DIAGNOSIS — Z1231 Encounter for screening mammogram for malignant neoplasm of breast: Secondary | ICD-10-CM | POA: Diagnosis not present

## 2024-08-26 LAB — HM MAMMOGRAPHY

## 2024-08-27 ENCOUNTER — Encounter: Payer: Self-pay | Admitting: Obstetrics and Gynecology

## 2024-08-27 ENCOUNTER — Ambulatory Visit: Payer: Self-pay | Admitting: Obstetrics and Gynecology

## 2024-08-31 ENCOUNTER — Encounter: Payer: Self-pay | Admitting: Family Medicine

## 2024-08-31 MED ORDER — UBRELVY 100 MG PO TABS: ORAL_TABLET | ORAL | 11 refills | Status: AC

## 2024-10-26 ENCOUNTER — Encounter: Payer: Self-pay | Admitting: Obstetrics and Gynecology

## 2024-10-26 NOTE — Progress Notes (Unsigned)
 "  59 y.o. [id number] Married African American female here for annual exam.    PCP: Verena Mems, MD   Patient's last menstrual period was 03/29/2017.           Sexually active: Yes.    The current method of family planning is status post hysterectomy.    Menopausal hormone therapy:  Estrace   Exercising: {yes no:314532}  {types:19826} Smoker:  no  OB History  Gravida Para Term Preterm AB Living  3 2 0 0 1 2  SAB IAB Ectopic Multiple Live Births  1 0 0 0 0    # Outcome Date GA Lbr Len/2nd Weight Sex Type Anes PTL Lv  3 SAB           2 Para     F Vag-Spont     1 Para     M Vag-Spont        HEALTH MAINTENANCE: Last 2 paps:  02/2016 normal per patient, 09-13-15 Neg:Neg HR HPV  History of abnormal Pap or positive HPV:  no Mammogram:   08/26/24 Breast Density Cat C BIRADS Cat 1 neg  Colonoscopy: 06/22/15 - due in 2026.   Bone Density:  n/a  Result  n/a   Immunization History  Administered Date(s) Administered   Influenza-Unspecified 08/11/2020, 07/14/2021   Pfizer(Comirnaty)Fall Seasonal Vaccine 12 years and older 06/17/2024      reports that she has never smoked. She has never used smokeless tobacco. She reports current alcohol use of about 3.0 standard drinks of alcohol per week. She reports that she does not use drugs.  Past Medical History:  Diagnosis Date   Abnormal uterine bleeding    Allergic rhinitis    Anemia    Asthma    Chest pain    Depression    hx of   Fibroid    Gall bladder stones    H. pylori infection    History of COVID-19 05/30/2020   Pos for Delta variant   History of esophagogastroduodenoscopy (EGD) 2016   H pylori +   Hypercholesterolemia    Iron deficiency anemia    Leiomyoma of uterus    Migraine    with aura   Mild recurrent major depression    Palpitations    Patient is Jehovah's Witness    S/P hysterectomy    Transaminitis    Vitamin D deficiency     Past Surgical History:  Procedure Laterality Date   CHOLECYSTECTOMY N/A  07/22/2020   Procedure: LAPAROSCOPIC CHOLECYSTECTOMY;  Surgeon: Sebastian Moles, MD;  Location: [facility] OR;  Service: General;  Laterality: N/A;   ROBOTIC ASSISTED TOTAL HYSTERECTOMY Bilateral 06/11/2017   Procedure: XI ROBOTIC ASSISTED TOTAL HYSTERECTOMY BILATERAL SALPINGECTOMY, RIGHT OOPHERECTOMY,  LYSIS OF ADHESIONS ,MINI LAPAROTOMY;  Surgeon: Eloy Herring, MD;  Location: [facility] ORS;  Service: Gynecology;  Laterality: Bilateral;   TUBAL LIGATION     then reversal   UTERINE FIBROID SURGERY      Current Outpatient Medications  Medication Sig Dispense Refill   albuterol  (PROVENTIL  HFA;VENTOLIN  HFA) 108 (90 BASE) MCG/ACT inhaler Inhale 1-2 puffs into the lungs every 6 (six) hours as needed for wheezing or shortness of breath.     albuterol  (PROVENTIL ) (5 MG/ML) 0.5% nebulizer solution Take 2.5 mg by nebulization every 6 (six) hours as needed for wheezing or shortness of breath.     aspirin-acetaminophen -caffeine (EXCEDRIN MIGRAINE) 250-250-65 MG tablet Take 2 tablets by mouth every 6 (six) hours as needed for migraine.      Atogepant  (QULIPTA ) 60  MG TABS Take 1 tablet (60 mg total) by mouth daily. 90 tablet 3   Biotin 89999 MCG TBDP Take 10,000 mcg by mouth at bedtime.     estradiol  (ESTRACE ) 1 MG tablet TAKE 1 TABLET(1 MG) BY MOUTH AT BEDTIME 90 tablet 1   fluticasone-salmeterol (ADVAIR HFA) 45-21 MCG/ACT inhaler Inhale 2 puffs into the lungs 2 (two) times daily.     mometasone -formoterol  (DULERA ) 200-5 MCG/ACT AERO Inhale 2 puffs into the lungs 2 (two) times daily.     Ubrogepant  (UBRELVY ) 100 MG TABS TAKE 1 TABLET BY MOUTH EVERY 2 HOURS AS NEEDED, MAXIMUM DAILY DOSE IS 2 TABLETS 10 tablet 11   No current facility-administered medications for this visit.    ALLERGIES: Codeine, Kiwi extract, Levetiracetam, Other, and Powder  Family History  Problem Relation Age of Onset   Hypertension Mother    Diabetes Mother    Osteoporosis Mother    Hypertension Father    Prostate cancer Father    Heart  attack Father    Kidney cancer Sister     Review of Systems  PHYSICAL EXAM:  LMP 03/29/2017     General appearance: alert, cooperative and appears stated age Head: normocephalic, without obvious abnormality, atraumatic Neck: no adenopathy, supple, symmetrical, trachea midline and thyroid  normal to inspection and palpation Lungs: clear to auscultation bilaterally Breasts: normal appearance, no masses or tenderness, No nipple retraction or dimpling, No nipple discharge or bleeding, No axillary adenopathy Heart: regular rate and rhythm Abdomen: soft, non-tender; no masses, no organomegaly Extremities: extremities normal, atraumatic, no cyanosis or edema Skin: skin color, texture, turgor normal. No rashes or lesions Lymph nodes: cervical, supraclavicular, and axillary nodes normal. Neurologic: grossly normal  Pelvic: External genitalia:  no lesions              No abnormal inguinal nodes palpated.              Urethra:  normal appearing urethra with no masses, tenderness or lesions              Bartholins and Skenes: normal                 Vagina: normal appearing vagina with normal color and discharge, no lesions              Cervix: no lesions              Pap taken: {yes no:314532} Bimanual Exam:  Uterus:  normal size, contour, position, consistency, mobility, non-tender              Adnexa: no mass, fullness, tenderness              Rectal exam: {yes no:314532}.  Confirms.              Anus:  normal sphincter tone, no lesions  Chaperone was present for exam:  {BSCHAPERONE:31226::Emily F, CMA}  ASSESSMENT: Well woman visit with gynecologic exam.  PHQ-2-9: ***  ***  PLAN: Mammogram screening discussed. Self breast awareness reviewed. Pap and HRV collected:  {yes no:314532} Guidelines for Calcium, Vitamin D, regular exercise program including cardiovascular and weight bearing exercise. Medication refills:  *** {LABS (Optional):23779} Follow up:  ***    Additional  counseling given.  {yes [id number]. ***  total time was spent for this patient encounter, including preparation, face-to-face counseling with the patient, coordination of care, and documentation of the encounter in addition to doing the well woman visit with gynecologic exam.        "

## 2024-10-27 ENCOUNTER — Ambulatory Visit (INDEPENDENT_AMBULATORY_CARE_PROVIDER_SITE_OTHER): Admitting: Obstetrics and Gynecology

## 2024-10-27 ENCOUNTER — Encounter: Payer: Self-pay | Admitting: Obstetrics and Gynecology

## 2024-10-27 VITALS — BP 116/78 | HR 74 | Ht 62.0 in | Wt 132.0 lb

## 2024-10-27 DIAGNOSIS — N760 Acute vaginitis: Secondary | ICD-10-CM

## 2024-10-27 DIAGNOSIS — B009 Herpesviral infection, unspecified: Secondary | ICD-10-CM | POA: Diagnosis not present

## 2024-10-27 DIAGNOSIS — Z01419 Encounter for gynecological examination (general) (routine) without abnormal findings: Secondary | ICD-10-CM

## 2024-10-27 DIAGNOSIS — Z01411 Encounter for gynecological examination (general) (routine) with abnormal findings: Secondary | ICD-10-CM

## 2024-10-27 DIAGNOSIS — Z79818 Long term (current) use of other agents affecting estrogen receptors and estrogen levels: Secondary | ICD-10-CM

## 2024-10-27 DIAGNOSIS — Z9289 Personal history of other medical treatment: Secondary | ICD-10-CM

## 2024-10-27 LAB — WET PREP FOR TRICH, YEAST, CLUE

## 2024-10-27 MED ORDER — ESTRADIOL 1 MG PO TABS: ORAL_TABLET | ORAL | 3 refills | Status: AC

## 2024-10-27 MED ORDER — METRONIDAZOLE 500 MG PO TABS: 500.0000 mg | ORAL_TABLET | Freq: Two times a day (BID) | ORAL | 0 refills | Status: AC

## 2024-10-27 MED ORDER — FLUCONAZOLE 150 MG PO TABS: 150.0000 mg | ORAL_TABLET | Freq: Once | ORAL | 0 refills | Status: AC

## 2024-10-27 NOTE — Addendum Note (Signed)
 Addended by: CATHLYN JAYSON CARY, BOBIE E on: 10/27/2024 10:23 AM   Modules accepted: Level of Service

## 2024-10-27 NOTE — Patient Instructions (Signed)

## 2024-10-29 ENCOUNTER — Ambulatory Visit: Payer: Self-pay | Admitting: Obstetrics and Gynecology

## 2024-11-25 ENCOUNTER — Other Ambulatory Visit: Admitting: Obstetrics and Gynecology

## 2024-11-25 ENCOUNTER — Other Ambulatory Visit

## 2025-11-08 ENCOUNTER — Encounter: Admitting: Obstetrics and Gynecology
